# Patient Record
Sex: Female | Born: 1935 | Race: White | Hispanic: No | Smoking: Former smoker
Health system: Southern US, Community
[De-identification: ages and names within clinical notes are randomized; demographics above are authoritative.]

## PROBLEM LIST (undated history)

## (undated) DIAGNOSIS — I4439 Other atrioventricular block: Secondary | ICD-10-CM

## (undated) DIAGNOSIS — I739 Peripheral vascular disease, unspecified: Secondary | ICD-10-CM

## (undated) DIAGNOSIS — M1990 Unspecified osteoarthritis, unspecified site: Secondary | ICD-10-CM

## (undated) DIAGNOSIS — D649 Anemia, unspecified: Secondary | ICD-10-CM

## (undated) DIAGNOSIS — C801 Malignant (primary) neoplasm, unspecified: Secondary | ICD-10-CM

## (undated) DIAGNOSIS — J189 Pneumonia, unspecified organism: Secondary | ICD-10-CM

## (undated) DIAGNOSIS — I10 Essential (primary) hypertension: Secondary | ICD-10-CM

## (undated) HISTORY — PX: CHOLECYSTECTOMY: SHX55

## (undated) HISTORY — PX: COLON SURGERY: SHX602

## (undated) HISTORY — PX: TUBAL LIGATION: SHX77

## (undated) HISTORY — DX: Peripheral vascular disease, unspecified: I73.9

---

## 2004-12-24 ENCOUNTER — Ambulatory Visit: Payer: Self-pay | Admitting: Cardiology

## 2011-11-17 DIAGNOSIS — I10 Essential (primary) hypertension: Secondary | ICD-10-CM | POA: Diagnosis not present

## 2011-11-17 DIAGNOSIS — E782 Mixed hyperlipidemia: Secondary | ICD-10-CM | POA: Diagnosis not present

## 2011-11-17 DIAGNOSIS — R5381 Other malaise: Secondary | ICD-10-CM | POA: Diagnosis not present

## 2011-11-24 DIAGNOSIS — M810 Age-related osteoporosis without current pathological fracture: Secondary | ICD-10-CM | POA: Diagnosis not present

## 2011-11-24 DIAGNOSIS — I10 Essential (primary) hypertension: Secondary | ICD-10-CM | POA: Diagnosis not present

## 2011-11-24 DIAGNOSIS — M1990 Unspecified osteoarthritis, unspecified site: Secondary | ICD-10-CM | POA: Diagnosis not present

## 2011-11-24 DIAGNOSIS — E782 Mixed hyperlipidemia: Secondary | ICD-10-CM | POA: Diagnosis not present

## 2011-11-24 DIAGNOSIS — K5900 Constipation, unspecified: Secondary | ICD-10-CM | POA: Diagnosis not present

## 2011-11-24 DIAGNOSIS — C189 Malignant neoplasm of colon, unspecified: Secondary | ICD-10-CM | POA: Diagnosis not present

## 2012-03-17 ENCOUNTER — Inpatient Hospital Stay (HOSPITAL_COMMUNITY)
Admission: EM | Admit: 2012-03-17 | Discharge: 2012-03-23 | DRG: 377 | Disposition: A | Discharge status: Home / Self Care | Payer: Medicare Other | Source: Ambulatory Visit | Attending: Internal Medicine | Admitting: Internal Medicine

## 2012-03-17 ENCOUNTER — Encounter (HOSPITAL_COMMUNITY): Payer: Self-pay | Admitting: Emergency Medicine

## 2012-03-17 DIAGNOSIS — K922 Gastrointestinal hemorrhage, unspecified: Secondary | ICD-10-CM | POA: Diagnosis not present

## 2012-03-17 DIAGNOSIS — K633 Ulcer of intestine: Secondary | ICD-10-CM

## 2012-03-17 DIAGNOSIS — I959 Hypotension, unspecified: Secondary | ICD-10-CM | POA: Diagnosis present

## 2012-03-17 DIAGNOSIS — D126 Benign neoplasm of colon, unspecified: Secondary | ICD-10-CM | POA: Diagnosis present

## 2012-03-17 DIAGNOSIS — R110 Nausea: Secondary | ICD-10-CM | POA: Diagnosis not present

## 2012-03-17 DIAGNOSIS — Z79899 Other long term (current) drug therapy: Secondary | ICD-10-CM | POA: Diagnosis not present

## 2012-03-17 DIAGNOSIS — R079 Chest pain, unspecified: Secondary | ICD-10-CM | POA: Diagnosis not present

## 2012-03-17 DIAGNOSIS — D649 Anemia, unspecified: Secondary | ICD-10-CM | POA: Diagnosis not present

## 2012-03-17 DIAGNOSIS — I498 Other specified cardiac arrhythmias: Secondary | ICD-10-CM | POA: Diagnosis present

## 2012-03-17 DIAGNOSIS — I10 Essential (primary) hypertension: Secondary | ICD-10-CM | POA: Diagnosis present

## 2012-03-17 DIAGNOSIS — R55 Syncope and collapse: Secondary | ICD-10-CM

## 2012-03-17 DIAGNOSIS — R404 Transient alteration of awareness: Secondary | ICD-10-CM | POA: Diagnosis not present

## 2012-03-17 DIAGNOSIS — J189 Pneumonia, unspecified organism: Secondary | ICD-10-CM

## 2012-03-17 DIAGNOSIS — IMO0002 Reserved for concepts with insufficient information to code with codable children: Secondary | ICD-10-CM

## 2012-03-17 DIAGNOSIS — I2119 ST elevation (STEMI) myocardial infarction involving other coronary artery of inferior wall: Secondary | ICD-10-CM | POA: Diagnosis not present

## 2012-03-17 DIAGNOSIS — R918 Other nonspecific abnormal finding of lung field: Secondary | ICD-10-CM | POA: Diagnosis not present

## 2012-03-17 DIAGNOSIS — K253 Acute gastric ulcer without hemorrhage or perforation: Secondary | ICD-10-CM | POA: Diagnosis present

## 2012-03-17 DIAGNOSIS — Z85038 Personal history of other malignant neoplasm of large intestine: Secondary | ICD-10-CM | POA: Diagnosis present

## 2012-03-17 DIAGNOSIS — D62 Acute posthemorrhagic anemia: Secondary | ICD-10-CM | POA: Diagnosis present

## 2012-03-17 DIAGNOSIS — R0602 Shortness of breath: Secondary | ICD-10-CM | POA: Diagnosis not present

## 2012-03-17 DIAGNOSIS — K274 Chronic or unspecified peptic ulcer, site unspecified, with hemorrhage: Principal | ICD-10-CM | POA: Diagnosis present

## 2012-03-17 DIAGNOSIS — K635 Polyp of colon: Secondary | ICD-10-CM

## 2012-03-17 DIAGNOSIS — T3995XA Adverse effect of unspecified nonopioid analgesic, antipyretic and antirheumatic, initial encounter: Secondary | ICD-10-CM | POA: Diagnosis present

## 2012-03-17 DIAGNOSIS — Z7982 Long term (current) use of aspirin: Secondary | ICD-10-CM

## 2012-03-17 HISTORY — DX: Anemia, unspecified: D64.9

## 2012-03-17 HISTORY — DX: Unspecified osteoarthritis, unspecified site: M19.90

## 2012-03-17 HISTORY — DX: Essential (primary) hypertension: I10

## 2012-03-17 HISTORY — DX: Malignant (primary) neoplasm, unspecified: C80.1

## 2012-03-17 HISTORY — DX: Pneumonia, unspecified organism: J18.9

## 2012-03-17 MED ORDER — SODIUM CHLORIDE 0.9 % IV BOLUS (SEPSIS)
500.0000 mL | Freq: Once | INTRAVENOUS | Status: AC
  Administered 2012-03-17: 500 mL via INTRAVENOUS

## 2012-03-17 NOTE — ED Notes (Signed)
MD at bedside for eval.

## 2012-03-18 ENCOUNTER — Emergency Department (HOSPITAL_COMMUNITY): Payer: Medicare Other

## 2012-03-18 ENCOUNTER — Inpatient Hospital Stay (HOSPITAL_COMMUNITY): Payer: Medicare Other

## 2012-03-18 ENCOUNTER — Encounter (HOSPITAL_COMMUNITY): Payer: Self-pay

## 2012-03-18 DIAGNOSIS — D649 Anemia, unspecified: Secondary | ICD-10-CM | POA: Diagnosis not present

## 2012-03-18 DIAGNOSIS — K274 Chronic or unspecified peptic ulcer, site unspecified, with hemorrhage: Secondary | ICD-10-CM | POA: Diagnosis present

## 2012-03-18 DIAGNOSIS — K279 Peptic ulcer, site unspecified, unspecified as acute or chronic, without hemorrhage or perforation: Secondary | ICD-10-CM | POA: Diagnosis not present

## 2012-03-18 DIAGNOSIS — Z7982 Long term (current) use of aspirin: Secondary | ICD-10-CM | POA: Diagnosis not present

## 2012-03-18 DIAGNOSIS — I10 Essential (primary) hypertension: Secondary | ICD-10-CM | POA: Diagnosis present

## 2012-03-18 DIAGNOSIS — K5289 Other specified noninfective gastroenteritis and colitis: Secondary | ICD-10-CM | POA: Diagnosis not present

## 2012-03-18 DIAGNOSIS — D126 Benign neoplasm of colon, unspecified: Secondary | ICD-10-CM | POA: Diagnosis not present

## 2012-03-18 DIAGNOSIS — K922 Gastrointestinal hemorrhage, unspecified: Secondary | ICD-10-CM | POA: Diagnosis not present

## 2012-03-18 DIAGNOSIS — K2901 Acute gastritis with bleeding: Secondary | ICD-10-CM | POA: Diagnosis not present

## 2012-03-18 DIAGNOSIS — R112 Nausea with vomiting, unspecified: Secondary | ICD-10-CM

## 2012-03-18 DIAGNOSIS — R079 Chest pain, unspecified: Secondary | ICD-10-CM | POA: Diagnosis not present

## 2012-03-18 DIAGNOSIS — J438 Other emphysema: Secondary | ICD-10-CM | POA: Diagnosis not present

## 2012-03-18 DIAGNOSIS — R0602 Shortness of breath: Secondary | ICD-10-CM | POA: Diagnosis not present

## 2012-03-18 DIAGNOSIS — R110 Nausea: Secondary | ICD-10-CM | POA: Diagnosis not present

## 2012-03-18 DIAGNOSIS — J189 Pneumonia, unspecified organism: Secondary | ICD-10-CM | POA: Diagnosis present

## 2012-03-18 DIAGNOSIS — R55 Syncope and collapse: Secondary | ICD-10-CM | POA: Diagnosis present

## 2012-03-18 DIAGNOSIS — K633 Ulcer of intestine: Secondary | ICD-10-CM | POA: Diagnosis not present

## 2012-03-18 DIAGNOSIS — K253 Acute gastric ulcer without hemorrhage or perforation: Secondary | ICD-10-CM | POA: Diagnosis not present

## 2012-03-18 DIAGNOSIS — I2119 ST elevation (STEMI) myocardial infarction involving other coronary artery of inferior wall: Secondary | ICD-10-CM | POA: Diagnosis not present

## 2012-03-18 DIAGNOSIS — I498 Other specified cardiac arrhythmias: Secondary | ICD-10-CM | POA: Diagnosis present

## 2012-03-18 DIAGNOSIS — IMO0002 Reserved for concepts with insufficient information to code with codable children: Secondary | ICD-10-CM | POA: Diagnosis not present

## 2012-03-18 DIAGNOSIS — D696 Thrombocytopenia, unspecified: Secondary | ICD-10-CM

## 2012-03-18 DIAGNOSIS — I959 Hypotension, unspecified: Secondary | ICD-10-CM | POA: Diagnosis not present

## 2012-03-18 DIAGNOSIS — Z85038 Personal history of other malignant neoplasm of large intestine: Secondary | ICD-10-CM | POA: Diagnosis not present

## 2012-03-18 DIAGNOSIS — K6389 Other specified diseases of intestine: Secondary | ICD-10-CM | POA: Diagnosis not present

## 2012-03-18 DIAGNOSIS — J9819 Other pulmonary collapse: Secondary | ICD-10-CM | POA: Diagnosis not present

## 2012-03-18 DIAGNOSIS — R918 Other nonspecific abnormal finding of lung field: Secondary | ICD-10-CM | POA: Diagnosis not present

## 2012-03-18 DIAGNOSIS — E871 Hypo-osmolality and hyponatremia: Secondary | ICD-10-CM | POA: Diagnosis not present

## 2012-03-18 DIAGNOSIS — D62 Acute posthemorrhagic anemia: Secondary | ICD-10-CM | POA: Diagnosis not present

## 2012-03-18 LAB — COMPREHENSIVE METABOLIC PANEL
AST: 17 U/L (ref 0–37)
Albumin: 2.2 g/dL — ABNORMAL LOW (ref 3.5–5.2)
Alkaline Phosphatase: 40 U/L (ref 39–117)
BUN: 40 mg/dL — ABNORMAL HIGH (ref 6–23)
BUN: 48 mg/dL — ABNORMAL HIGH (ref 6–23)
CO2: 25 mEq/L (ref 19–32)
CO2: 25 mEq/L (ref 19–32)
Calcium: 8.1 mg/dL — ABNORMAL LOW (ref 8.4–10.5)
Chloride: 109 mEq/L (ref 96–112)
Creatinine, Ser: 0.73 mg/dL (ref 0.50–1.10)
GFR calc Af Amer: 87 mL/min — ABNORMAL LOW (ref >90)
GFR calc non Af Amer: 75 mL/min — ABNORMAL LOW (ref >90)
Glucose, Bld: 96 mg/dL (ref 70–99)
Potassium: 3.4 mEq/L — ABNORMAL LOW (ref 3.5–5.1)
Sodium: 144 mEq/L (ref 135–145)
Total Bilirubin: 0.1 mg/dL — ABNORMAL LOW (ref 0.3–1.2)
Total Protein: 5 g/dL — ABNORMAL LOW (ref 6.0–8.3)

## 2012-03-18 LAB — CBC
HCT: 23.6 % — ABNORMAL LOW (ref 36.0–46.0)
HCT: 29.7 % — ABNORMAL LOW (ref 36.0–46.0)
Hemoglobin: 10.2 g/dL — ABNORMAL LOW (ref 12.0–15.0)
MCH: 30.4 pg (ref 26.0–34.0)
MCHC: 34.3 g/dL (ref 30.0–36.0)
MCV: 88.4 fL (ref 78.0–100.0)
Platelets: 364 10*3/uL (ref 150–400)
RBC: 2.59 MIL/uL — ABNORMAL LOW (ref 3.87–5.11)
RBC: 3.36 MIL/uL — ABNORMAL LOW (ref 3.87–5.11)
RDW: 15 % (ref 11.5–15.5)
WBC: 17.2 10*3/uL — ABNORMAL HIGH (ref 4.0–10.5)

## 2012-03-18 LAB — GLUCOSE, CAPILLARY
Glucose-Capillary: 96 mg/dL (ref 70–99)
Glucose-Capillary: 79 mg/dL (ref 70–99)

## 2012-03-18 LAB — CARDIAC PANEL(CRET KIN+CKTOT+MB+TROPI)
CK, MB: 2 ng/mL (ref 0.3–4.0)
CK, MB: 1.8 ng/mL (ref 0.3–4.0)
Relative Index: INVALID (ref 0.0–2.5)
Total CK: 31 U/L (ref 7–177)
Troponin I: <0.3 ng/mL (ref <0.30)
Troponin I: <0.3 ng/mL (ref <0.30)

## 2012-03-18 LAB — LACTIC ACID, PLASMA: Lactic Acid, Venous: 1.3 mmol/L (ref 0.5–2.2)

## 2012-03-18 LAB — URINALYSIS, ROUTINE W REFLEX MICROSCOPIC
Bilirubin Urine: NEGATIVE
Hgb urine dipstick: NEGATIVE
Ketones, ur: NEGATIVE mg/dL
Protein, ur: NEGATIVE mg/dL
Urobilinogen, UA: 0.2 mg/dL (ref 0.0–1.0)

## 2012-03-18 MED ORDER — SODIUM CHLORIDE 0.9 % IV SOLN
INTRAVENOUS | Status: DC
  Administered 2012-03-19: 16:00:00 via INTRAVENOUS
  Administered 2012-03-20: 75 mL/h via INTRAVENOUS
  Administered 2012-03-20 (×2): 500 mL via INTRAVENOUS
  Administered 2012-03-20 (×2): via INTRAVENOUS

## 2012-03-18 MED ORDER — PANTOPRAZOLE SODIUM 40 MG IV SOLR
40.0000 mg | Freq: Two times a day (BID) | INTRAVENOUS | Status: DC
  Administered 2012-03-18 – 2012-03-19 (×3): 40 mg via INTRAVENOUS
  Filled 2012-03-18 (×6): qty 40

## 2012-03-18 MED ORDER — DEXTROSE 5 % IV SOLN
1.0000 g | Freq: Every day | INTRAVENOUS | Status: DC
  Administered 2012-03-18: 1 g via INTRAVENOUS
  Filled 2012-03-18 (×2): qty 10

## 2012-03-18 MED ORDER — BISACODYL 5 MG PO TBEC
5.0000 mg | DELAYED_RELEASE_TABLET | Freq: Four times a day (QID) | ORAL | Status: AC
  Administered 2012-03-18: 5 mg via ORAL
  Filled 2012-03-18: qty 1

## 2012-03-18 MED ORDER — SODIUM CHLORIDE 0.9 % IJ SOLN
INTRAMUSCULAR | Status: AC
  Administered 2012-03-18: 10:00:00
  Filled 2012-03-18: qty 10

## 2012-03-18 MED ORDER — DEXTROSE 5 % IV SOLN
500.0000 mg | Freq: Once | INTRAVENOUS | Status: AC
  Administered 2012-03-18: 500 mg via INTRAVENOUS
  Filled 2012-03-18: qty 500

## 2012-03-18 MED ORDER — ACETAMINOPHEN 325 MG PO TABS
650.0000 mg | ORAL_TABLET | Freq: Four times a day (QID) | ORAL | Status: DC | PRN
  Filled 2012-03-18: qty 1

## 2012-03-18 MED ORDER — ONDANSETRON HCL 4 MG/2ML IJ SOLN: 4.0000 mg | Freq: Four times a day (QID) | INTRAMUSCULAR | Status: DC | PRN

## 2012-03-18 MED ORDER — DEXTROSE 5 % IV SOLN
1.0000 g | Freq: Once | INTRAVENOUS | Status: AC
  Administered 2012-03-18: 1 g via INTRAVENOUS
  Filled 2012-03-18: qty 10

## 2012-03-18 MED ORDER — ACETAMINOPHEN 650 MG RE SUPP: 650.0000 mg | Freq: Four times a day (QID) | RECTAL | Status: DC | PRN

## 2012-03-18 MED ORDER — SODIUM CHLORIDE 0.9 % IJ SOLN
3.0000 mL | Freq: Two times a day (BID) | INTRAMUSCULAR | Status: DC
  Administered 2012-03-18 – 2012-03-22 (×8): 3 mL via INTRAVENOUS

## 2012-03-18 MED ORDER — DEXTROSE 5 % IV SOLN
500.0000 mg | Freq: Every day | INTRAVENOUS | Status: DC
  Administered 2012-03-18 – 2012-03-21 (×4): 500 mg via INTRAVENOUS
  Filled 2012-03-18 (×7): qty 500

## 2012-03-18 MED ORDER — SODIUM CHLORIDE 0.9 % IV BOLUS (SEPSIS)
500.0000 mL | Freq: Once | INTRAVENOUS | Status: AC
  Administered 2012-03-18: 500 mL via INTRAVENOUS

## 2012-03-18 MED ORDER — ONDANSETRON HCL 4 MG PO TABS: 4.0000 mg | ORAL_TABLET | Freq: Four times a day (QID) | ORAL | Status: DC | PRN

## 2012-03-18 MED ORDER — PEG-KCL-NACL-NASULF-NA ASC-C 100 G PO SOLR
1.0000 | Freq: Once | ORAL | Status: AC
  Administered 2012-03-18: 100 g via ORAL
  Filled 2012-03-18: qty 1

## 2012-03-18 NOTE — Progress Notes (Signed)
Utilization review completed.  

## 2012-03-18 NOTE — Consult Note (Signed)
[facility] Consult: 3:29 PM 03/18/2012   Referring Provider: Dr Butler Denmark Primary Care Physician:  Donzetta Sprung, MD Primary Gastroenterologist:  Dr. Marcell Anger, surgeion , has done colonoscopies  Reason for Consultation:  Anemia, normocytic.  FOB+  HPI: Mary [name] is a 78 y.o. female.  S/P partail colectomy for colon cancer around 1998.  Vague recall of follow up colonoscopies or recurrent polyps.  Last colonoscopy between 5 and 10 years ago.  Admitted 03/17/12.  At home pt had acute weakness yesterday.  Thinking is that she had presyncope.  Pt is vague in describing sxs.  No SOB, cough, chest pain.  Hgb 7.7, MCV 91.  BUN 40.  Creatinine normal.  FOB+ but there has been no blood or melena.  No N/V, anorexia, heartburn, belly pain, weight loss/gain.  Occasional Miralax for constipation.  Hgb 10.2 following transfusion.     PTA meds list includes Plavix, 81 and 325 mg ASA.  Pt says she only uses 81 mg ASA.  She also says she uses 400 mg Ibuprofen about 3 x weekly.  She is not recognize Plavix as something she takes. I called both Dr Reuel Boom and [facility] and nither confirms Plavix as an active prescription.      Past Medical History  Diagnosis Date  . Hypertension   . Osteoporosis   . Pneumonia   . Anemia   . Arthritis   . Cancer     colon cancer    Past Surgical History  Procedure Date  . Cholecystectomy   . Colon surgery     colon cancer  . Tubal ligation     Prior to Admission medications   Medication Sig Start Date End Date Taking? Authorizing Provider  aspirin 325 MG tablet Take 325 mg by mouth daily. She does not take this  Yes Historical Provider, MD  aspirin EC 81 MG tablet Take 81 mg by mouth daily.   Yes Historical Provider, MD  Calcium Carbonate-Vitamin D (CALCIUM + D PO) Take 1 tablet by mouth 2 (two) times daily.   Yes Historical Provider, MD  clopidogrel (PLAVIX) 300 MG TABS Take 300 mg by mouth once. [facility]  pharmacy and primary MD do not have her listed as taking this med  Yes Historical Provider, MD  diltiazem (DILACOR XR) 240 MG 24 hr capsule Take 240 mg by mouth daily.   Yes Historical Provider, MD  docusate sodium (COLACE) 100 MG capsule Take 100 mg by mouth 2 (two) times daily. Stool softener   Yes Historical Provider, MD  Heparin Lock Flush (HEPARIN FLUSH) 10 UNIT/ML injection 4,000 Units once.   Yes Historical Provider, MD  losartan-hydrochlorothiazide (HYZAAR) 100-25 MG per tablet Take 1 tablet by mouth daily.   Yes Historical Provider, MD  Red Yeast Rice 600 MG TABS Take 1 tablet by mouth daily.   Yes Historical Provider, MD  Miralax                                       PRN  Scheduled Meds:    . azithromycin  500 mg Intravenous Once  . azithromycin  500 mg Intravenous QHS  . cefTRIAXone (ROCEPHIN)  IV  1 g Intravenous Once  . cefTRIAXone (ROCEPHIN)  IV  1 g Intravenous QHS  . pantoprazole (PROTONIX) IV  40 mg Intravenous Q12H  . sodium chloride  500 mL Intravenous Once  . sodium chloride  500 mL Intravenous  Once  . sodium chloride  3 mL Intravenous Q12H  . sodium chloride       Infusions:    . sodium chloride 75 mL/hr at 03/18/12 1521   PRN Meds: acetaminophen, acetaminophen, ondansetron (ZOFRAN) IV, ondansetron   Allergies as of 03/17/2012  . (No Known Allergies)    History reviewed. No pertinent family history.  History   Social History  . Marital Status: Widowed    Spouse Name: N/A    Number of Children: N/A  . Years of Education: N/A   Occupational History  . Not on file.   Social History Main Topics  . Smoking status: Former Smoker    Quit date: 03/19/1979  . Smokeless tobacco: Never Used  . Alcohol Use: No  . Drug Use: No  . Sexually Active: Not Currently   Other Topics Concern  . Not on file   Social History Narrative  . No narrative on file    REVIEW OF SYSTEMS: Constitutional:  Generally strong.  Does her own vacuuming and lawn care.     ENT:  No nose bleeds Pulm:  No cough or SOB, no DOE CV:  No chest pain.  Some tachycardia with harder exertion. No pedal edema.  Thinks she may have had a test where they applied gel to her neck.  GU:  No hematuria, no dysuria.  No incontinence GI:  Per HPI Heme:  No hx anemia.    Transfusions:  None previously Neuro:  No headaches, seizures, numbness, falls Derm:  No rash, sores, itching Endocrine:  No polyuria, polydypsia, sweats Immunization:  Flu vaccine up to date Travel:  none   PHYSICAL EXAM: Vital signs in last 24 hours: Temp:  [98.1 F (36.7 C)-98.9 F (37.2 C)] 98.4 F (36.9 C) (05/09 1245) Pulse Rate:  [56-88] 66  (05/09 1245) Resp:  [16-28] 22  (05/09 1245) BP: (77-131)/(35-57) 110/57 mmHg (05/09 1245) SpO2:  [99 %-100 %] 100 % (05/09 1220) Weight:  [108 lb 7.5 oz (49.2 kg)] 108 lb 7.5 oz (49.2 kg) (05/09 0600)  General: thin but not necessarily cachectic looking elderly WF.  Does not look ill. Head:  No evidence trauma.  Face symmetric  Eyes:  No icterus or pallor Ears:  Slight HOH.  Nose:  No discharge or sneezing Mouth:  Teeth with some caries.  Many upper molars missing.  MM clear and moist Neck:  No JVD.  Bruit audible on the right carotid. Lungs:  Clear B.  No cough or dyspnea. Heart: RRR.  1/6 SEM.  Abdomen:  Soft, NT, ND, no bruits, mass , HSM.  Well healed lower transverse scar..   Rectal: formed hard stool in rectal vault is FOB-.  No masses.  Small , non-thrombosed external hemorrhoids.    Musc/Skeltl: arthritic changes in fingers Extremities:  No pedal edema.  Feet warm  Neurologic:  No tremor.  HOH.  Fully oriented.  Full strength of upper/lower limbs. Skin:  No rash or sores Tattoos:  none Nodes:  No groin or cervical adenopathy   Psych:  Pleasant, cooperative.  Not agitated or anxious.   Intake/Output from previous day: 05/08 0701 - 05/09 0700 In: 450 [I.V.:100; Blood:350] Out: 850 [Urine:850] Intake/Output this shift: Total I/O In: 603  [I.V.:253; Blood:350] Out: 1150 [Urine:1150]  LAB RESULTS:  Basename 03/18/12 1420 03/17/12 2342  WBC 13.2* 17.2*  HGB 10.2* 7.7*  HCT 29.7* 23.6*  PLT 291 364   BMET Lab Results  Component Value Date   NA 144 03/18/2012  NA 141 03/17/2012   K 3.4* 03/18/2012   K 3.4* 03/17/2012   CL 110 03/18/2012   CL 109 03/17/2012   CO2 25 03/18/2012   CO2 25 03/17/2012   GLUCOSE 96 03/18/2012   GLUCOSE 147* 03/17/2012   BUN 48* 03/18/2012   BUN 40* 03/17/2012   CREATININE 0.73 03/18/2012   CREATININE 0.89 03/17/2012   CALCIUM 8.1* 03/18/2012   CALCIUM 8.0* 03/17/2012   LFT  Basename 03/18/12 1420 03/17/12 2342  PROT 5.0* 4.7*  ALBUMIN 2.3* 2.2*  AST 15 17  ALT 10 10  ALKPHOS 38* 40  BILITOT 0.3 0.1*  BILIDIR -- --  IBILI -- --   PT/INR Lab Results  Component Value Date   INR 1.08 03/18/2012    RADIOLOGY STUDIES: Dg Chest 2 View 03/18/2012  *RADIOLOGY REPORT*  Clinical Data: Syncope; nausea.  CHEST - 2 VIEW  Comparison: None.  Findings: The lungs are well-aerated.  Left midlung airspace opacity raises concern for pneumonia.  There is no evidence of pleural effusion or pneumothorax.  The heart is normal in size; the mediastinal contour is within normal limits. Moderate hiatal hernia is noted, with an air-fluid level.  No acute osseous abnormalities are seen.  There is slight loss of vertebral body height along the lower thoracic spine.  IMPRESSION:  1.  Left midlung airspace opacity raises concern for pneumonia.  If the patient has symptoms of pneumonia, would treat for pneumonia and then perform follow-up chest radiograph to ensure resolution of airspace opacity. 2.  Moderate hiatal hernia noted, with an air-fluid level.  Original Report Authenticated By: Tonia Ghent, M.D.   Ct Head Wo Contrast 03/18/2012  *RADIOLOGY REPORT*  Clinical Data: Nausea and flushing; syncope.  Hypotension.  CT HEAD WITHOUT CONTRAST  Technique:  Contiguous axial images were obtained from the base of the skull through the vertex  without contrast.  Comparison: None.  Findings: There is no evidence of acute infarction, mass lesion, or intra- or extra-axial hemorrhage on CT.  Prominence of the ventricles and sulci reflects mild cortical volume loss.  Mild scattered periventricular and white matter change likely reflects small vessel ischemic microangiopathy.  The posterior fossa, including the cerebellum, brainstem and fourth ventricle, is within normal limits.  The basal ganglia are unremarkable in appearance.  The cerebral hemispheres demonstrate grossly normal gray-white differentiation.  No mass effect or midline shift is seen.  There is no evidence of fracture; visualized osseous structures are unremarkable in appearance.  The orbits are within normal limits. The paranasal sinuses and mastoid air cells are well-aerated.  No significant soft tissue abnormalities are seen.  IMPRESSION:  1.  No acute intracranial pathology seen on CT. 2.  Mild cortical volume loss and scattered small vessel ischemic microangiopathy.  Original Report Authenticated By: Tonia Ghent, M.D.    ENDOSCOPIC STUDIES: Colonoscopy, year unknown  IMPRESSION: 1.  Normocytic anemia, symptomatic with weakness.  FOB+ at admission, FOB- by my exam today.  Suspect this is an acute anemia, given normal MCV, and very recent high functional capacity (pushed lawn mower over the weekend).  Her BUN is elevated which often correlates with upper GI bleeding source.  Good response to transfusion 2 units PRBCs 2,  Hx colon cancer about 15 years ago.  Partial colectomy, no adjuvant therapy.  Year of last colonoscopy unknown, but was more than 5 years ago.   3.  Hiatal hernia on cxr..  No reflux sxs.  4.  Pneumonia.  WBCs improved.  On Zithromax and  Rocephin.  5.  Right carotid bruit.  6.  Plavix listed as PTA med.  This is not listed as active med by Dr Reuel Boom in Jan 2013, nor by [facility] pharmacy.  So do not think she takes this.  Indication not apparent. (no cva, TIA, A  fib, stent.. In her hx) if she ever did take this, though she does have carotid bruit.   PLAN: 1.  EGD/colonoscopy tomorrow.  2.  D/c foley, will complicate prep and is risk for infection.    LOS: 1 day   Jennye Moccasin  03/18/2012, 3:29 PM Pager: [phone]

## 2012-03-18 NOTE — Care Management Note (Signed)
    Page 1 of 1   03/22/2012     2:35:04 PM   CARE MANAGEMENT NOTE 03/22/2012  Patient:  Mary [name], Mary [name]   Account Number:  [id number]  Date Initiated:  03/18/2012  Documentation initiated by:  Donn Pierini  Subjective/Objective Assessment:   Pt admitted with GIB, hgb 7.7     Action/Plan:   PTA lived at home with daughter, was independent with ADLs   Anticipated DC Date:  03/22/2012   Anticipated DC Plan:  HOME W HOME HEALTH SERVICES      DC Planning Services  CM consult      Choice offered to / List presented to:             Status of service:  In process, will continue to follow Medicare Important Message given?   (If response is "NO", the following Medicare IM given date fields will be blank) Date Medicare IM given:   Date Additional Medicare IM given:    Discharge Disposition:    Per UR Regulation:  Reviewed for med. necessity/level of care/duration of stay  If discussed at Long Length of Stay Meetings, dates discussed:    Comments:  PCP- [place]  03/22/12 14:30p debbie Delyle Weider rn,bsn [phone] still on iv protonix and iv zithromax.  03/18/12- 1130- Donn Pierini RN, BSN [phone] Spoke with pt at bedside- per conversation pt states that she lives at home with daughter, and is independent- does not use any DME. Pt reports that she has an appointment with her PCP next month. She has medication coverage and transportaion home. NCM to follow for d/c needs/planning.

## 2012-03-18 NOTE — ED Provider Notes (Signed)
History     CSN: [id number]  Arrival date & time 03/17/12  2323   First MD Initiated Contact with Patient 03/17/12 2338      Chief Complaint  Patient presents with  . Loss of Consciousness    (Consider location/radiation/quality/duration/timing/severity/associated sxs/prior treatment) HPI A LEVEL 5 CAVEAT PERTAINS DUE TO URGENT NEED FOR INTERVENTION Patient presents as a transfer from more had emergency department do to concern for code 64. Patient was evaluated by cardiology and cleared from a [place] standpoint. However she has had near syncopal episode which prompted coming to the emergency department. She describes feeling flushed and faint and then nearly loses consciousness but does not. She denies any chest pain or shortness of breath. She has no nausea. She has not syncopized. She denies any fever or chills. Past Medical History  Diagnosis Date  . Hypertension   . Osteoporosis   . Pneumonia   . Anemia   . Arthritis   . Cancer     colon cancer    Past Surgical History  Procedure Date  . Cholecystectomy   . Colon surgery     colon cancer  . Tubal ligation     History reviewed. No pertinent family history.  History  Substance Use Topics  . Smoking status: Former Smoker    Quit date: 03/19/1979  . Smokeless tobacco: Never Used  . Alcohol Use: No    OB History    Grav Para Term Preterm Abortions TAB SAB Ect Mult Living                  Review of Systems UNABLE TO OBTAIN ROS DUE TO LEVEL 5 CAVEAT  Allergies  Review of patient's allergies indicates no known allergies.  Home Medications   No current outpatient prescriptions on file.  BP 124/74  Pulse 95  Temp(Src) 97.5 F (36.4 C) (Oral)  Resp 20  Ht 5\' 1"  (1.549 m)  Wt 108 lb 7.5 oz (49.2 kg)  BMI 20.49 kg/m2  SpO2 98% Vitals reviewed Physical Exam Physical Examination: General appearance - alert, tired appearing, and in no distress Mental status - alert, oriented to person, place, and  time Eyes - pupils equal and reactive, extraocular eye movements intact, conjunctival pallor Mouth - mucous membranes moist, pharynx normal without lesions Chest - clear to auscultation, no wheezes, rales or rhonchi, symmetric air entry Heart - normal rate, regular rhythm, normal S1, S2, no murmurs, rubs, clicks or gallops Abdomen - soft, nontender, nondistended, no masses or organomegaly, nabs Neurological - alert, oriented, normal speech, strength 5/5 in extremities x 4, sensation intact Extremities - peripheral pulses normal, no pedal edema, no clubbing or cyanosis Skin - normal coloration and turgor, no rashes, generalized pallor  ED Course  Procedures (including critical care time)   Date: 03/18/2012  Rate: 67  Rhythm: normal sinus rhythm  QRS Axis: normal  Intervals: normal  ST/T Wave abnormalities: nonspecific ST/T changes  Conduction Disutrbances:none  Narrative Interpretation: baseline wander in inferior leads  Old EKG Reviewed: none available  CRITICAL CARE Performed by: Ethelda Chick   Total critical care time: 40  Critical care time was exclusive of separately billable procedures and treating other patients.  Critical care was necessary to treat or prevent imminent or life-threatening deterioration.  Critical care was time spent personally by me on the following activities: development of treatment plan with patient and/or surrogate as well as nursing, discussions with consultants, evaluation of patient's response to treatment, examination of patient, obtaining history from  patient or surrogate, ordering and performing treatments and interventions, ordering and review of laboratory studies, ordering and review of radiographic studies, pulse oximetry and re-evaluation of patient's condition. Care  2:18 AM  D/w Dr. Toniann Fail, [facility]- he requests head CT, patient to be admitted to team 3, telemetry  3:17 AM- hemocult positive- no gross blood or melena, soft brown  stool.  Labs Reviewed  CBC - Abnormal; Notable for the following:    WBC 17.2 (*)    RBC 2.59 (*)    Hemoglobin 7.7 (*)    HCT 23.6 (*)    All other components within normal limits  COMPREHENSIVE METABOLIC PANEL - Abnormal; Notable for the following:    Potassium 3.4 (*)    Glucose, Bld 147 (*)    BUN 40 (*)    Calcium 8.0 (*)    Total Protein 4.7 (*)    Albumin 2.2 (*)    Total Bilirubin 0.1 (*)    GFR calc non Af Amer 57 (*)    GFR calc Af Amer 66 (*)    All other components within normal limits  CBC - Abnormal; Notable for the following:    WBC 13.2 (*)    RBC 3.36 (*)    Hemoglobin 10.2 (*) POST TRANSFUSION SPECIMEN   HCT 29.7 (*)    All other components within normal limits  COMPREHENSIVE METABOLIC PANEL - Abnormal; Notable for the following:    Potassium 3.4 (*)    BUN 48 (*)    Calcium 8.1 (*)    Total Protein 5.0 (*)    Albumin 2.3 (*)    Alkaline Phosphatase 38 (*)    GFR calc non Af Amer 75 (*)    GFR calc Af Amer 87 (*)    All other components within normal limits  GLUCOSE, CAPILLARY - Abnormal; Notable for the following:    Glucose-Capillary 108 (*)    All other components within normal limits  CBC - Abnormal; Notable for the following:    WBC 12.4 (*)    RBC 3.22 (*)    Hemoglobin 9.7 (*)    HCT 28.4 (*)    All other components within normal limits  BASIC METABOLIC PANEL - Abnormal; Notable for the following:    Potassium 2.9 (*)    Chloride 114 (*)    BUN 24 (*) DELTA CHECK NOTED   Calcium 7.8 (*)    GFR calc non Af Amer 79 (*)    All other components within normal limits  RETICULOCYTES - Abnormal; Notable for the following:    RBC. 3.22 (*)    All other components within normal limits  BASIC METABOLIC PANEL - Abnormal; Notable for the following:    Chloride 114 (*)    CO2 18 (*)    Calcium 7.3 (*)    GFR calc non Af Amer 78 (*)    All other components within normal limits  CBC - Abnormal; Notable for the following:    WBC 17.5 (*)    RBC  2.96 (*)    Hemoglobin 8.9 (*)    HCT 26.2 (*)    All other components within normal limits  CBC - Abnormal; Notable for the following:    WBC 13.3 (*)    RBC 2.73 (*)    Hemoglobin 8.1 (*)    HCT 24.1 (*)    All other components within normal limits  CULTURE, BLOOD (ROUTINE X 2)  CULTURE, BLOOD (ROUTINE X 2)  URINALYSIS, ROUTINE W REFLEX MICROSCOPIC  URINE CULTURE  CARDIAC PANEL(CRET KIN+CKTOT+MB+TROPI)  TYPE AND SCREEN  PREPARE RBC (CROSSMATCH)  ABO/RH  LACTIC ACID, PLASMA  OCCULT BLOOD, POC DEVICE  PROTIME-INR  CARDIAC PANEL(CRET KIN+CKTOT+MB+TROPI)  URINE CULTURE  MRSA PCR SCREENING  GLUCOSE, CAPILLARY  GLUCOSE, CAPILLARY  VITAMIN B12  FOLATE  IRON AND TIBC  FERRITIN  GLUCOSE, CAPILLARY  OCCULT BLOOD X 1 CARD TO LAB, STOOL  CBC  BASIC METABOLIC PANEL   No results found.   1. Anemia 2. Pneumonia 3. Near syncope 4. leukocytosis    MDM  Pt presenting as transfer from [facility] due to concern for code stemi.  Seen by cardiology in ED and code stemi cancelled.  Pt with anemia on workup as well as pneuomonia on CXR with leukocytosis.  Pt started on antibiotics, PRBCs ordered.  Rectal reveals soft brown stool- but heme positive.  Pt denies hx of anemia.  Pt admitted to [facility] service for further evaluation and management.         Ethelda Chick, MD 03/20/12 [phone]

## 2012-03-18 NOTE — ED Notes (Signed)
Pt had an episode of feeling hot and pale. Pt's BP dropped to 77/36. Pt layed flat and a 500cc bolus started. MD made aware and at bedside to see pt.

## 2012-03-18 NOTE — Progress Notes (Signed)
Pt transferred from [facility] as suspected STEMI.  EMS and pt report nausea/flushing episode followed by syncope at home.  Upon EMS arrival to patients home BP 70s systolic that improved with fluids. She has not had chest pain and there are no diagnostic changes on her EKG at [facility] or here presently.  Of note she did have an EKG in EMS that showed LBBB that has since resolved.  I suspect this is a rate related bundle branch block. She reports that she has not had any chest pain.  By my exam she is pale and lethargic with hypotension.  She has a murmur of chronic mitral regurgitation. Her labs are notable for WBC 18 and Hct 26.  ED will workup for sepsis - STEMI called off, discussed with Dr. Herbie Baltimore - please call if I can be of any assistance

## 2012-03-18 NOTE — ED Notes (Signed)
Dr. Karma Ganja advised of positive Hemoccult result.

## 2012-03-18 NOTE — Progress Notes (Signed)
[facility]   78 y/o admitted with multiple episodes of syncope and found to have Pneumonia, anemia and was stool occult postitive.   She tells me that she has had a dry hacking cough for about 2 weeks and thought it was allergies. She has not noticed any blood in her stool or had any epigastric discomfort or reflux. No weight loss. Her colectomy for colon cancer was 16 yrs ago in [place]. She did not need adjuvant therapy as nodes were negative. Last colonoscopy was at least 10 yrs ago as well.   Pt examined. Mild crackles in b/l bases  A/p  Near syncope Due to dehydration and hypotension. CT head negative.   GI bleed/H/O colon cancer, stage I Will contact GI. Cont to follow stool occults.    Anemia S/p 2 units PRBC.repeat Hgb is 10.2. F/u on iron levels in AM.   Pneumonia- CAP Cont Zithro and Rocephin  Hypotensive and bradycardic in ER Holding all hypertensives   Calvert Cantor, MD [phone]

## 2012-03-18 NOTE — H&P (Signed)
Mary [name] is an 78 y.o. female.   PCP - Dr.Daniel Zacarias Pontes. Chief Complaint: Passing out. HPI: 78 year-old female with known history of hypertension, history of colon cancer status post partial colectomy was brought to the ER after transfer from [facility] for code STEMI. Cardiologist over here canceled the code. As per patient's daughter last evening while at home patient suddenly became listless. She called EMS and was taken to [facility]. Patient has had at least 4-5 episodes since then. Each episode lasts only for a few seconds. And during those episodes patient was found to be hypotensive. In the ER patient was initially found to be mildly hypotensive and normal saline 500 cc bolus was given. Lab work show anemia and stool for occult blood is positive. In addition chest x-ray shows infiltrates. As per patient's daughter patient has not had any specific complaints including chest pain, shortness of breath, nausea vomiting, abdominal pain, diarrhea prior to this incident.  Past Medical History  Diagnosis Date  . Hypertension   . Osteoporosis   . Pneumonia   . Anemia   . Arthritis   . Cancer     colon cancer    Past Surgical History  Procedure Date  . Cholecystectomy   . Colon surgery     colon cancer  . Tubal ligation     History reviewed. No pertinent family history. Social History:  reports that she quit smoking about 33 years ago. She has never used smokeless tobacco. She reports that she does not drink alcohol or use illicit drugs.  Allergies: No Known Allergies  Medications Prior to Admission  Medication Sig Dispense Refill  . aspirin 325 MG tablet Take 325 mg by mouth daily.      Marland Kitchen aspirin EC 81 MG tablet Take 81 mg by mouth daily.      . Calcium Carbonate-Vitamin D (CALCIUM + D PO) Take 1 tablet by mouth 2 (two) times daily.      . clopidogrel (PLAVIX) 300 MG TABS Take 300 mg by mouth once.      . diltiazem (DILACOR XR) 240 MG 24 hr capsule Take  240 mg by mouth daily.      Marland Kitchen docusate sodium (COLACE) 100 MG capsule Take 100 mg by mouth 2 (two) times daily. Stool softener      . Heparin Lock Flush (HEPARIN FLUSH) 10 UNIT/ML injection 4,000 Units once.      Marland Kitchen losartan-hydrochlorothiazide (HYZAAR) 100-25 MG per tablet Take 1 tablet by mouth daily.      . Red Yeast Rice 600 MG TABS Take 1 tablet by mouth daily.        Results for orders placed during the hospital encounter of 03/17/12 (from the past 48 hour(s))  CBC     Status: Abnormal   Collection Time   03/17/12 11:42 PM      Component Value Range Comment   WBC 17.2 (*) 4.0 - 10.5 (K/uL)    RBC 2.59 (*) 3.87 - 5.11 (MIL/uL)    Hemoglobin 7.7 (*) 12.0 - 15.0 (g/dL)    HCT 16.1 (*) 09.6 - 46.0 (%)    MCV 91.1  78.0 - 100.0 (fL)    MCH 29.7  26.0 - 34.0 (pg)    MCHC 32.6  30.0 - 36.0 (g/dL)    RDW 04.5  40.9 - 81.1 (%)    Platelets 364  150 - 400 (K/uL)   COMPREHENSIVE METABOLIC PANEL     Status: Abnormal   Collection Time  03/17/12 11:42 PM      Component Value Range Comment   Sodium 141  135 - 145 (mEq/L)    Potassium 3.4 (*) 3.5 - 5.1 (mEq/L)    Chloride 109  96 - 112 (mEq/L)    CO2 25  19 - 32 (mEq/L)    Glucose, Bld 147 (*) 70 - 99 (mg/dL)    BUN 40 (*) 6 - 23 (mg/dL)    Creatinine, Ser 4.09  0.50 - 1.10 (mg/dL)    Calcium 8.0 (*) 8.4 - 10.5 (mg/dL)    Total Protein 4.7 (*) 6.0 - 8.3 (g/dL)    Albumin 2.2 (*) 3.5 - 5.2 (g/dL)    AST 17  0 - 37 (U/L)    ALT 10  0 - 35 (U/L)    Alkaline Phosphatase 40  39 - 117 (U/L)    Total Bilirubin 0.1 (*) 0.3 - 1.2 (mg/dL)    GFR calc non Af Amer 57 (*) >90 (mL/min)    GFR calc Af Amer 66 (*) >90 (mL/min)   CARDIAC PANEL(CRET KIN+CKTOT+MB+TROPI)     Status: Normal   Collection Time   03/17/12 11:52 PM      Component Value Range Comment   Total CK 31  7 - 177 (U/L)    CK, MB 2.0  0.3 - 4.0 (ng/mL)    Troponin I <0.30  <0.30 (ng/mL)    Relative Index RELATIVE INDEX IS INVALID  0.0 - 2.5    URINALYSIS, ROUTINE W REFLEX MICROSCOPIC      Status: Normal   Collection Time   03/18/12 12:10 AM      Component Value Range Comment   Color, Urine YELLOW  YELLOW     APPearance CLEAR  CLEAR     Specific Gravity, Urine 1.016  1.005 - 1.030     pH 7.0  5.0 - 8.0     Glucose, UA NEGATIVE  NEGATIVE (mg/dL)    Hgb urine dipstick NEGATIVE  NEGATIVE     Bilirubin Urine NEGATIVE  NEGATIVE     Ketones, ur NEGATIVE  NEGATIVE (mg/dL)    Protein, ur NEGATIVE  NEGATIVE (mg/dL)    Urobilinogen, UA 0.2  0.0 - 1.0 (mg/dL)    Nitrite NEGATIVE  NEGATIVE     Leukocytes, UA NEGATIVE  NEGATIVE  MICROSCOPIC NOT DONE ON URINES WITH NEGATIVE PROTEIN, BLOOD, LEUKOCYTES, NITRITE, OR GLUCOSE <1000 mg/dL.  TYPE AND SCREEN     Status: Normal (Preliminary result)   Collection Time   03/18/12 12:59 AM      Component Value Range Comment   ABO/RH(D) A POS      Antibody Screen NEG      Sample Expiration 03/21/2012      Unit Number [id number]      Blood Component Type RED CELLS,LR      Unit division 00      Status of Unit ALLOCATED      Transfusion Status OK TO TRANSFUSE      Crossmatch Result Compatible      Unit Number [id number]      Blood Component Type RED CELLS,LR      Unit division 00      Status of Unit ALLOCATED      Transfusion Status OK TO TRANSFUSE      Crossmatch Result Compatible     ABO/RH     Status: Normal   Collection Time   03/18/12 12:59 AM      Component Value Range Comment   ABO/RH(D)  A POS     PREPARE RBC (CROSSMATCH)     Status: Normal   Collection Time   03/18/12  2:00 AM      Component Value Range Comment   Order Confirmation ORDER PROCESSED BY BLOOD BANK     LACTIC ACID, PLASMA     Status: Normal   Collection Time   03/18/12  3:01 AM      Component Value Range Comment   Lactic Acid, Venous 1.3  0.5 - 2.2 (mmol/L)   OCCULT BLOOD, POC DEVICE     Status: Normal   Collection Time   03/18/12  3:17 AM      Component Value Range Comment   Fecal Occult Bld POSITIVE      Dg Chest 2 View  03/18/2012  *RADIOLOGY REPORT*  Clinical  Data: Syncope; nausea.  CHEST - 2 VIEW  Comparison: None.  Findings: The lungs are well-aerated.  Left midlung airspace opacity raises concern for pneumonia.  There is no evidence of pleural effusion or pneumothorax.  The heart is normal in size; the mediastinal contour is within normal limits. Moderate hiatal hernia is noted, with an air-fluid level.  No acute osseous abnormalities are seen.  There is slight loss of vertebral body height along the lower thoracic spine.  IMPRESSION:  1.  Left midlung airspace opacity raises concern for pneumonia.  If the patient has symptoms of pneumonia, would treat for pneumonia and then perform follow-up chest radiograph to ensure resolution of airspace opacity. 2.  Moderate hiatal hernia noted, with an air-fluid level.  Original Report Authenticated By: Tonia Ghent, M.D.   Ct Head Wo Contrast  03/18/2012  *RADIOLOGY REPORT*  Clinical Data: Nausea and flushing; syncope.  Hypotension.  CT HEAD WITHOUT CONTRAST  Technique:  Contiguous axial images were obtained from the base of the skull through the vertex without contrast.  Comparison: None.  Findings: There is no evidence of acute infarction, mass lesion, or intra- or extra-axial hemorrhage on CT.  Prominence of the ventricles and sulci reflects mild cortical volume loss.  Mild scattered periventricular and white matter change likely reflects small vessel ischemic microangiopathy.  The posterior fossa, including the cerebellum, brainstem and fourth ventricle, is within normal limits.  The basal ganglia are unremarkable in appearance.  The cerebral hemispheres demonstrate grossly normal gray-white differentiation.  No mass effect or midline shift is seen.  There is no evidence of fracture; visualized osseous structures are unremarkable in appearance.  The orbits are within normal limits. The paranasal sinuses and mastoid air cells are well-aerated.  No significant soft tissue abnormalities are seen.  IMPRESSION:  1.  No acute  intracranial pathology seen on CT. 2.  Mild cortical volume loss and scattered small vessel ischemic microangiopathy.  Original Report Authenticated By: Tonia Ghent, M.D.    Review of Systems  Constitutional: Negative.   HENT: Negative.   Eyes: Negative.   Respiratory: Negative.   Cardiovascular: Negative.   Gastrointestinal: Negative.   Genitourinary: Negative.   Musculoskeletal: Negative.   Skin: Negative.   Neurological: Positive for dizziness.  Psychiatric/Behavioral: Negative.     Blood pressure 95/47, pulse 62, temperature 98.1 F (36.7 C), temperature source Oral, resp. rate 25, SpO2 100.00%. Physical Exam  Constitutional: She is oriented to person, place, and time. She appears well-developed. No distress.       Cachectic.  HENT:  Head: Normocephalic and atraumatic.  Right Ear: External ear normal.  Left Ear: External ear normal.  Mouth/Throat: No oropharyngeal exudate.  Eyes:  Pupils are equal, round, and reactive to light. Right eye exhibits no discharge. Left eye exhibits no discharge. No scleral icterus.       Pallor.  Neck: Normal range of motion. Neck supple.  Cardiovascular: Normal rate and regular rhythm.   Respiratory: Effort normal and breath sounds normal. No respiratory distress. She has no wheezes. She has no rales.  GI: Soft. Bowel sounds are normal. She exhibits no distension. There is no tenderness. There is no rebound.  Musculoskeletal: Normal range of motion. She exhibits no edema and no tenderness.  Neurological: She is alert and oriented to person, place, and time.       Moves all extremities.  Skin: Skin is warm and dry. She is not diaphoretic.  Psychiatric: Her behavior is normal.     Assessment/Plan #1. GI bleed with history of colon cancer - transfuse 2 units of packed red blood cells. At this time I will keep patient on Protonix IV every 12. Patient takes aspirin which will be discontinued. Keep patient n.p.o. and consult GI. Recheck CBC after  transfusion. #2. Pneumonia - we'll treat this as community-acquired pneumonia. Patient has been started on ceftriaxone and azithromycin which will continue.  #3. Hypo-tension and near-syncopal episodes from volume depletion and possible infection - see #1 and 2. #4. History of colon cancer status post resection. #5. History of hypertension presently hypotensive. #6. Anemia from #1 reason.  CODE STATUS - full code.  Danniel Grenz N. 03/18/2012, 5:18 AM

## 2012-03-19 ENCOUNTER — Encounter (HOSPITAL_COMMUNITY)
Admission: EM | Disposition: A | Discharge status: Home / Self Care | Payer: Self-pay | Source: Ambulatory Visit | Attending: Internal Medicine

## 2012-03-19 ENCOUNTER — Encounter (HOSPITAL_COMMUNITY): Payer: Self-pay

## 2012-03-19 DIAGNOSIS — IMO0002 Reserved for concepts with insufficient information to code with codable children: Secondary | ICD-10-CM

## 2012-03-19 DIAGNOSIS — E871 Hypo-osmolality and hyponatremia: Secondary | ICD-10-CM

## 2012-03-19 DIAGNOSIS — D126 Benign neoplasm of colon, unspecified: Secondary | ICD-10-CM

## 2012-03-19 DIAGNOSIS — K279 Peptic ulcer, site unspecified, unspecified as acute or chronic, without hemorrhage or perforation: Secondary | ICD-10-CM

## 2012-03-19 DIAGNOSIS — D696 Thrombocytopenia, unspecified: Secondary | ICD-10-CM

## 2012-03-19 DIAGNOSIS — I10 Essential (primary) hypertension: Secondary | ICD-10-CM

## 2012-03-19 DIAGNOSIS — D649 Anemia, unspecified: Secondary | ICD-10-CM

## 2012-03-19 DIAGNOSIS — K253 Acute gastric ulcer without hemorrhage or perforation: Secondary | ICD-10-CM | POA: Diagnosis present

## 2012-03-19 DIAGNOSIS — K635 Polyp of colon: Secondary | ICD-10-CM | POA: Diagnosis present

## 2012-03-19 DIAGNOSIS — K633 Ulcer of intestine: Secondary | ICD-10-CM | POA: Diagnosis not present

## 2012-03-19 DIAGNOSIS — Z85038 Personal history of other malignant neoplasm of large intestine: Secondary | ICD-10-CM

## 2012-03-19 HISTORY — PX: ESOPHAGOGASTRODUODENOSCOPY: SHX5428

## 2012-03-19 HISTORY — PX: COLONOSCOPY: SHX5424

## 2012-03-19 LAB — CBC
HCT: 28.4 % — ABNORMAL LOW (ref 36.0–46.0)
HCT: 26.2 % — ABNORMAL LOW (ref 36.0–46.0)
Hemoglobin: 9.7 g/dL — ABNORMAL LOW (ref 12.0–15.0)
Hemoglobin: 8.9 g/dL — ABNORMAL LOW (ref 12.0–15.0)
MCH: 30.1 pg (ref 26.0–34.0)
MCHC: 34.2 g/dL (ref 30.0–36.0)
RBC: 2.96 MIL/uL — ABNORMAL LOW (ref 3.87–5.11)
RDW: 15 % (ref 11.5–15.5)

## 2012-03-19 LAB — RETICULOCYTES
RBC.: 3.22 MIL/uL — ABNORMAL LOW (ref 3.87–5.11)
Retic Count, Absolute: 45.1 10*3/uL (ref 19.0–186.0)
Retic Ct Pct: 1.4 % (ref 0.4–3.1)

## 2012-03-19 LAB — BASIC METABOLIC PANEL
BUN: 24 mg/dL — ABNORMAL HIGH (ref 6–23)
BUN: 12 mg/dL (ref 6–23)
Calcium: 7.8 mg/dL — ABNORMAL LOW (ref 8.4–10.5)
Chloride: 114 mEq/L — ABNORMAL HIGH (ref 96–112)
GFR calc Af Amer: >90 mL/min (ref >90)
GFR calc Af Amer: >90 mL/min (ref >90)
GFR calc non Af Amer: 79 mL/min — ABNORMAL LOW (ref >90)
GFR calc non Af Amer: 78 mL/min — ABNORMAL LOW (ref >90)
Glucose, Bld: 88 mg/dL (ref 70–99)
Potassium: 2.9 mEq/L — ABNORMAL LOW (ref 3.5–5.1)
Potassium: 4.2 mEq/L (ref 3.5–5.1)
Sodium: 140 mEq/L (ref 135–145)

## 2012-03-19 LAB — URINE CULTURE: Culture: NO GROWTH

## 2012-03-19 LAB — IRON AND TIBC: UIBC: 149 ug/dL (ref 125–400)

## 2012-03-19 LAB — FERRITIN: Ferritin: 68 ng/mL (ref 10–291)

## 2012-03-19 SURGERY — COLONOSCOPY
Anesthesia: Moderate Sedation

## 2012-03-19 MED ORDER — FENTANYL CITRATE 0.05 MG/ML IJ SOLN
INTRAMUSCULAR | Status: AC
  Filled 2012-03-19: qty 2

## 2012-03-19 MED ORDER — SODIUM CHLORIDE 0.9 % IJ SOLN
INTRAMUSCULAR | Status: AC
  Administered 2012-03-19: 14:00:00
  Filled 2012-03-19: qty 10

## 2012-03-19 MED ORDER — PANTOPRAZOLE SODIUM 40 MG PO TBEC
40.0000 mg | DELAYED_RELEASE_TABLET | Freq: Two times a day (BID) | ORAL | Status: DC
  Administered 2012-03-19: 40 mg via ORAL
  Filled 2012-03-19: qty 1

## 2012-03-19 MED ORDER — POTASSIUM CHLORIDE CRYS ER 20 MEQ PO TBCR
40.0000 meq | EXTENDED_RELEASE_TABLET | Freq: Once | ORAL | Status: AC
  Administered 2012-03-19: 40 meq via ORAL
  Filled 2012-03-19: qty 2

## 2012-03-19 MED ORDER — SODIUM CHLORIDE 0.9 % IV BOLUS (SEPSIS)
250.0000 mL | Freq: Once | INTRAVENOUS | Status: AC
  Administered 2012-03-19: 250 mL via INTRAVENOUS

## 2012-03-19 MED ORDER — BUTAMBEN-TETRACAINE-BENZOCAINE 2-2-14 % EX AERO
INHALATION_SPRAY | CUTANEOUS | Status: DC | PRN
  Administered 2012-03-19: 2 via TOPICAL

## 2012-03-19 MED ORDER — SODIUM CHLORIDE 0.9 % IJ SOLN
INTRAMUSCULAR | Status: DC | PRN
  Administered 2012-03-19: 11:00:00

## 2012-03-19 MED ORDER — MIDAZOLAM HCL 10 MG/2ML IJ SOLN
INTRAMUSCULAR | Status: AC
  Filled 2012-03-19: qty 2

## 2012-03-19 MED ORDER — MIDAZOLAM HCL 10 MG/2ML IJ SOLN
INTRAMUSCULAR | Status: DC | PRN
  Administered 2012-03-19: 2 mg via INTRAVENOUS
  Administered 2012-03-19 (×3): 1 mg via INTRAVENOUS

## 2012-03-19 MED ORDER — FENTANYL CITRATE 0.05 MG/ML IJ SOLN
INTRAMUSCULAR | Status: DC | PRN
  Administered 2012-03-19 (×3): 25 ug via INTRAVENOUS

## 2012-03-19 MED ORDER — POTASSIUM CHLORIDE 10 MEQ/100ML IV SOLN
10.0000 meq | INTRAVENOUS | Status: AC
  Administered 2012-03-19 (×3): 10 meq via INTRAVENOUS
  Administered 2012-03-19: 10 meq
  Filled 2012-03-19: qty 400

## 2012-03-19 MED ORDER — SODIUM CHLORIDE 0.9 % IV SOLN
Freq: Once | INTRAVENOUS | Status: AC
  Administered 2012-03-19: 500 mL via INTRAVENOUS

## 2012-03-19 MED ORDER — EPINEPHRINE HCL 0.1 MG/ML IJ SOLN
INTRAMUSCULAR | Status: AC
  Filled 2012-03-19: qty 10

## 2012-03-19 NOTE — Progress Notes (Signed)
[facility]  Interim history: 78 y/o admitted with multiple episodes of syncope and found to have Pneumonia, anemia and was stool occult postitive.   Subjective: S/p EGD and Colonoscopy. I have explained the results and the fact that she needs to avoid ASA and other NSAIDS. She eats very spicy food and drinks coffee which I have also asked her to limit. She states she will f/u with GI here in [place] (lives in [place]).   Objective: Blood pressure 113/47, pulse 70, temperature 98.4 F (36.9 C), temperature source Oral, resp. rate 16, height 5\' 1"  (1.549 m), weight 49.2 kg (108 lb 7.5 oz), SpO2 99.00%. Weight change:   Intake/Output Summary (Last 24 hours) at 03/19/12 1349 Last data filed at 03/19/12 1200  Gross per 24 hour  Intake   1820 ml  Output    400 ml  Net   1420 ml    Physical Exam: General appearance: alert and cooperative Lungs: clear to auscultation bilaterally Heart: regular rate and rhythm, S1, S2 normal, no murmur, click, rub or gallop Abdomen: soft, non-tender; bowel sounds normal; no masses,  no organomegaly Extremities: extremities normal, atraumatic, no cyanosis or edema  Lab Results:  Basename 03/19/12 0430 03/18/12 1420  NA 145 144  K 2.9* 3.4*  CL 114* 110  CO2 22 25  GLUCOSE 88 96  BUN 24* 48*  CREATININE 0.64 0.73  CALCIUM 7.8* 8.1*  MG -- --  PHOS -- --    Basename 03/18/12 1420 03/17/12 2342  AST 15 17  ALT 10 10  ALKPHOS 38* 40  BILITOT 0.3 0.1*  PROT 5.0* 4.7*  ALBUMIN 2.3* 2.2*   No results found for this basename: LIPASE:2,AMYLASE:2 in the last 72 hours  Basename 03/19/12 0430 03/18/12 1420  WBC 12.4* 13.2*  NEUTROABS -- --  HGB 9.7* 10.2*  HCT 28.4* 29.7*  MCV 88.2 88.4  PLT 299 291    Basename 03/18/12 1420 03/17/12 2352  CKTOTAL 26 31  CKMB 1.8 2.0  CKMBINDEX -- --  TROPONINI <0.30 <0.30   No components found with this basename: POCBNP:3 No results found for this basename: DDIMER:2 in the last 72 hours No  results found for this basename: HGBA1C:2 in the last 72 hours No results found for this basename: CHOL:2,HDL:2,LDLCALC:2,TRIG:2,CHOLHDL:2,LDLDIRECT:2 in the last 72 hours No results found for this basename: TSH,T4TOTAL,FREET3,T3FREE,THYROIDAB in the last 72 hours  Basename 03/19/12 0430  VITAMINB12 357  FOLATE >20.0  FERRITIN 68  TIBC 262  IRON 113  RETICCTPCT 1.4    Micro Results: Recent Results (from the past 240 hour(s))  CULTURE, BLOOD (ROUTINE X 2)     Status: Normal (Preliminary result)   Collection Time   03/17/12 11:46 PM      Component Value Range Status Comment   Specimen Description BLOOD RIGHT ARM   Final    Special Requests BOTTLES DRAWN AEROBIC AND ANAEROBIC 5CC   Final    Culture  Setup Time [id number]   Final    Culture     Final    Value:        BLOOD CULTURE RECEIVED NO GROWTH TO DATE CULTURE WILL BE HELD FOR 5 DAYS BEFORE ISSUING A FINAL NEGATIVE REPORT   Report Status PENDING   Incomplete   CULTURE, BLOOD (ROUTINE X 2)     Status: Normal (Preliminary result)   Collection Time   03/17/12 11:51 PM      Component Value Range Status Comment   Specimen Description BLOOD RIGHT ARM  Final    Special Requests BOTTLES DRAWN AEROBIC AND ANAEROBIC 5CC   Final    Culture  Setup Time [id number]   Final    Culture     Final    Value:        BLOOD CULTURE RECEIVED NO GROWTH TO DATE CULTURE WILL BE HELD FOR 5 DAYS BEFORE ISSUING A FINAL NEGATIVE REPORT   Report Status PENDING   Incomplete   URINE CULTURE     Status: Normal   Collection Time   03/18/12 12:10 AM      Component Value Range Status Comment   Specimen Description URINE, CATHETERIZED   Final    Special Requests NONE   Final    Culture  Setup Time [id number]   Final    Colony Count NO GROWTH   Final    Culture NO GROWTH   Final    Report Status 03/19/2012 FINAL   Final   MRSA PCR SCREENING     Status: Normal   Collection Time   03/18/12  6:18 AM      Component Value Range Status Comment   MRSA by PCR  NEGATIVE  NEGATIVE  Final     Studies/Results: Dg Chest 2 View  03/18/2012  *RADIOLOGY REPORT*  Clinical Data: Syncope; nausea.  CHEST - 2 VIEW  Comparison: None.  Findings: The lungs are well-aerated.  Left midlung airspace opacity raises concern for pneumonia.  There is no evidence of pleural effusion or pneumothorax.  The heart is normal in size; the mediastinal contour is within normal limits. Moderate hiatal hernia is noted, with an air-fluid level.  No acute osseous abnormalities are seen.  There is slight loss of vertebral body height along the lower thoracic spine.  IMPRESSION:  1.  Left midlung airspace opacity raises concern for pneumonia.  If the patient has symptoms of pneumonia, would treat for pneumonia and then perform follow-up chest radiograph to ensure resolution of airspace opacity. 2.  Moderate hiatal hernia noted, with an air-fluid level.  Original Report Authenticated By: Tonia Ghent, M.D.   Ct Head Wo Contrast  03/18/2012  *RADIOLOGY REPORT*  Clinical Data: Nausea and flushing; syncope.  Hypotension.  CT HEAD WITHOUT CONTRAST  Technique:  Contiguous axial images were obtained from the base of the skull through the vertex without contrast.  Comparison: None.  Findings: There is no evidence of acute infarction, mass lesion, or intra- or extra-axial hemorrhage on CT.  Prominence of the ventricles and sulci reflects mild cortical volume loss.  Mild scattered periventricular and white matter change likely reflects small vessel ischemic microangiopathy.  The posterior fossa, including the cerebellum, brainstem and fourth ventricle, is within normal limits.  The basal ganglia are unremarkable in appearance.  The cerebral hemispheres demonstrate grossly normal gray-white differentiation.  No mass effect or midline shift is seen.  There is no evidence of fracture; visualized osseous structures are unremarkable in appearance.  The orbits are within normal limits. The paranasal sinuses and  mastoid air cells are well-aerated.  No significant soft tissue abnormalities are seen.  IMPRESSION:  1.  No acute intracranial pathology seen on CT. 2.  Mild cortical volume loss and scattered small vessel ischemic microangiopathy.  Original Report Authenticated By: Tonia Ghent, M.D.    Medications: Scheduled Meds:   . sodium chloride   Intravenous Once  . azithromycin  500 mg Intravenous QHS  . bisacodyl  5 mg Oral Q6H  . cefTRIAXone (ROCEPHIN)  IV  1 g Intravenous QHS  .  pantoprazole (PROTONIX) IV  40 mg Intravenous Q12H  . peg 3350 powder  1 kit Oral Once  . potassium chloride  10 mEq Intravenous Q1 Hr x 4  . potassium chloride  40 mEq Oral Once  . sodium chloride  3 mL Intravenous Q12H  . sodium chloride       Continuous Infusions:   . sodium chloride 75 mL/hr at 03/18/12 1600   PRN Meds:.acetaminophen, acetaminophen, ondansetron (ZOFRAN) IV, ondansetron, DISCONTD: butamben-tetracaine-benzocaine, DISCONTD: EPINEPHrine 1:10,000, 10 mL syringe/NS, 10 mL vial for sclerotherapy inj mixture, DISCONTD: fentaNYL, DISCONTD: midazolam  Assessment/Plan: Principal Problem:  *GI bleed Ulcers noted in stomach and transverse and descending colon consistent with NSAID uses. Awaiting h. Pylori antibody.  BID PPI x 12wks.  Pt does not take Plavix- please see Blase Mess   Anemia due to acute blood loss  Pt did not notice blood loss but drop in Hgb and normal Iron levels point toward this.  Replace w/ 2 units PRBC. Iron levels normal.   Pneumonia D/c rocephin- cont zithromax x 5 days. She is significantly improved. F/u wbc counts.    HTN (hypertension) Hypotensive and bradycardic in ER  Holding all hypertensives   Near syncope Due to dehydration/ hypotension and acute blood loss  Hypokalemia Due to colon prep? Replacing.    Colon polyps 4 polyps removed today- need to f/u biopsy  H/o colon CA Her colectomy for colon cancer was 16 yrs ago in [place]. She did not need adjuvant  therapy as nodes were negative.  Disposition OK to transfer to floor. Pt eval.    LOS: 2 days   [facility] [phone] 03/19/2012, 1:49 PM

## 2012-03-19 NOTE — Op Note (Signed)
[facility] [address] [place], [place]  [address]  ENDOSCOPY PROCEDURE REPORT  PATIENT:  Collyn, Selk  MR#:  [id number] BIRTHDATE:  1923-05-14, 88 yrs. old  GENDER:  female ENDOSCOPIST:  Carie Caddy. Alfhild Partch, MD Referred by:  [facility], PROCEDURE DATE:  03/19/2012 PROCEDURE:  EGD for control of bleeding ASA CLASS:  Class III INDICATIONS:  FOBT + stool, anemia MEDICATIONS:   Fentanyl 75 mcg IV, Versed 5 mg IV TOPICAL ANESTHETIC:  Cetacaine Spray  DESCRIPTION OF PROCEDURE:   After the risks benefits and alternatives of the procedure were thoroughly explained, informed consent was obtained.  The Pentax Gastroscope [id number] and [id number]) endoscope was introduced through the mouth and advanced to the second portion of the duodenum, without limitations.  The instrument was slowly withdrawn as the mucosa was fully examined. <<PROCEDUREIMAGES>>  A nonobstructing schatzki's ring was found at the gastroesophageal junction.  Tortuous in the distal esophagus.  Otherwise normal esophagus.  A hiatal hernia was found.  Two ulcers were found in the cardia, near the diaphragmatic hiatus (query Cameron's in etiology).  The largest was 2 cm with visible vessel. Epinephrine 1:10.000 was injected around the ulcer and then bipolar coagulation therapy was applied with success. The 2nd cardia ulcer was smaller and clean-based.   There was a shallow, clean based ulcer in the antrum without stigmata. The duodenal bulb was normal in appearance, as was the postbulbar duodenum.    Retroflexed views revealed findings as previously described.    The scope was then withdrawn from the patient and the procedure completed.  COMPLICATIONS:  None  ENDOSCOPIC IMPRESSION: 1) Non-obstructing schatzki's ring at the gastroesophageal junction 2) Tortuous in the distal esophagus 3) Otherwise normal esophagus 4) Hiatal hernia 5) 2 ulcers in the cardia, one with visible vessel.   Treated with success.  This is felt to be the cause of acute anemia. 6) Antral ulcer with clean base. 7) Normal duodenum  RECOMMENDATIONS: 1) No NSAIDs 2) Test H. Pylori serum antibody and treat if positive. 3) BID PO PPI for at least 12 weeks, then daily thereafter. 4) Follow blood counts to ensure improvement/normalization. 5) Can follow in GI clinic locally or with me in [facility]. Alexza Norbeck, MD  CC:  The Patient  n. eSIGNED:   Carie Caddy. Beula Joyner at 03/19/2012 11:37 AM  Debby Bud, [phone]

## 2012-03-19 NOTE — Progress Notes (Signed)
Patient c/o feeling weird. She states that she couldn't describe the feeling. Only to say id feels like she was fading away vital taken and MD page. Patient had small mod  bloody stool about 15 min prior to compliant.

## 2012-03-19 NOTE — Op Note (Signed)
[facility] [address] [place], [place]  [address]  COLONOSCOPY PROCEDURE REPORT  PATIENT:  Mary [name], Mary [name]  MR#:  [id number] BIRTHDATE:  1923/08/15, 88 yrs. old  GENDER:  female ENDOSCOPIST:  Carie Caddy. Soumya Colson, MD REF. BY:  [facility], PROCEDURE DATE:  03/19/2012 PROCEDURE:  Colonoscopy with biopsy and snare polypectomy, Colon with cold biopsy polypectomy ASA CLASS:  Class III INDICATIONS:  Anemia, history of colon cancer (remote) MEDICATIONS:   Fentanyl 75 mcg IV, Versed 5 mg IV  DESCRIPTION OF PROCEDURE:   After the risks benefits and alternatives of the procedure were thoroughly explained, informed consent was obtained.  Digital rectal exam was performed and revealed no rectal masses.   The Pentax Colonoscope [id number] and [id number]) endoscope was introduced through the anus and advanced to the cecum, which was identified by both the appendix and ileocecal valve, without limitations.  The quality of the prep was Moviprep fair.  The instrument was then slowly withdrawn as the colon was fully examined. <<PROCEDUREIMAGES>>  FINDINGS:  Two sessile polyps, 2 - 6 mm were found in the cecum. One polyp was removed using cold biopsy forceps. One Polyp was snared without cautery. Retrieval was successful. Two sessile polyps, 2 - 5 mm were found in the ascending colon. One Polyp was snared without cautery. Retrieval was successful. One polyp was removed using cold biopsy forceps.  Mild diverticulosis was found in the left colon.  A few, shallow ulcers were found in the transverse and descending colon. With standard forceps, biopsy was obtained and sent to pathology. Query NSAID induced.  The remaining colonic mucosa was unremarkable.  Surgical anastomosis found in proximal rectum, healthy appearing. Retroflexed views in the rectum revealed no abnormalities. The scope was then withdrawn from the cecum and the procedure completed.  COMPLICATIONS:   None ENDOSCOPIC IMPRESSION: 1) Two polyps in the cecum. Removed and sent to pathology. 2) Two polyps in the ascending colon. Removed and sent to pathology. 3) Mild diverticulosis in the left colon 4) Scattered ulcers in the transverse and descending colon. Other mucosa normal in appearance. Query NSAID related ulcers. Biopsies obtained. 5) Healthy appearing rectosigmoid anastomosis.  RECOMMENDATIONS: 1) Avoid all NSAIDs 2) Await pathology results 3) High fiber diet. 4) Given your age, you will not need another colonoscopy for colon cancer screening or polyp surveillance. These types of tests usually stop around the age 31.  Carie Caddy. Kawanda Drumheller, MD  CC:  The Patient  n. eSIGNED:   Carie Caddy. Shaquayla Klimas at 03/19/2012 11:46 AM  Debby Bud, [phone]

## 2012-03-19 NOTE — Progress Notes (Signed)
PT Cancellation Note   Treatment cancelled today due to patient receiving procedure or test. Pt is currently receiving colonoscopy. Will attempt evaluation tomorrow pending medical stability.  Thank you, 03/19/2012 Milana Kidney DPT PAGER: [phone] OFFICE: [phone]

## 2012-03-19 NOTE — Consult Note (Signed)
Patient seen, examined, and I agree with the above documentation, including the assessment and plan. Anemia felt more likely acute.  Functional status has been very good at home. Agree with EGD/colon given acute anemia and hx of colon cancer. The nature of the procedure, as well as the risks, benefits, and alternatives were carefully and thoroughly reviewed with the patient. Ample time for discussion and questions allowed. The patient understood, was satisfied, and agreed to proceed.  Further recs after procedures

## 2012-03-19 NOTE — Progress Notes (Signed)
Callahan,NP updated of patients hemoglobin level

## 2012-03-19 NOTE — Progress Notes (Signed)
MD ordered 250 Bolus and labs

## 2012-03-19 NOTE — Clinical Documentation Improvement (Signed)
GENERIC DOCUMENTATION CLARIFICATION QUERY  THIS DOCUMENT IS NOT A PERMANENT PART OF THE MEDICAL RECORD  TO RESPOND TO THE THIS QUERY, FOLLOW THE INSTRUCTIONS BELOW:  1. If needed, update documentation for the patient's encounter via the notes activity.  2. Access this query again and click edit on the In [employer].  3. After updating, or not, click F2 to complete all highlighted (required) fields concerning your review. Select "additional documentation in the medical record" OR "no additional documentation provided".  4. Click Sign note button.  5. The deficiency will fall out of your In Basket *Please let us know if you are not able to complete this workflow by phone or e-mail (listed below).  Please update your documentation within the medical record to reflect your response to this query.                                                                                        03/19/12   Dear Dr. Calvert Cantor / Associates,  In a better effort to capture your patient's severity of illness, reflect appropriate length of stay and utilization of resources, a review of the patient medical record has revealed the following indicators.    Based on your clinical judgment, please clarify and document in a progress note and/or discharge summary the clinical condition associated with the following supporting information:  You may use possible, probable, or suspect with inpatient documentation. possible, probable, suspected diagnoses MUST be documented at the time of discharge  In responding to this query please exercise your independent judgment.  The fact that a query is asked, does not imply that any particular answer is desired or expected.  Possible Clinical Conditions?  _______Congenital  Schatzki Ring  _______Other Condition__________________ _______Cannot Clinically Determine     Supporting Information:  Risk Factors: Per 5/10 GI procedure not,e nonobstructing schatzki's ring was  found at the gastroesophageal  junction.   Signs & Symptoms:  Diagnostics: 5/10 EDG.  Treatment :No NSAIDs,   BID PO PPI for at least 12 weeks, then daily thereafter.   Follow blood counts to ensure improvement/normalization.   Can follow in GI clinic locally or MD in [place].     Reviewed:  no additional documentation provided, MD has not responded to query.   Thank You,  Shelda Pal RN, BSN, CCM   Clinical Documentation Specialist:  Pager [phone]  Health Information Management [place]

## 2012-03-20 ENCOUNTER — Encounter (HOSPITAL_COMMUNITY)
Admission: EM | Disposition: A | Discharge status: Home / Self Care | Payer: Self-pay | Source: Ambulatory Visit | Attending: Internal Medicine

## 2012-03-20 ENCOUNTER — Encounter (HOSPITAL_COMMUNITY): Payer: Self-pay | Admitting: *Deleted

## 2012-03-20 DIAGNOSIS — IMO0002 Reserved for concepts with insufficient information to code with codable children: Secondary | ICD-10-CM | POA: Diagnosis not present

## 2012-03-20 DIAGNOSIS — I10 Essential (primary) hypertension: Secondary | ICD-10-CM

## 2012-03-20 DIAGNOSIS — E871 Hypo-osmolality and hyponatremia: Secondary | ICD-10-CM

## 2012-03-20 DIAGNOSIS — D696 Thrombocytopenia, unspecified: Secondary | ICD-10-CM

## 2012-03-20 DIAGNOSIS — K2901 Acute gastritis with bleeding: Secondary | ICD-10-CM

## 2012-03-20 HISTORY — PX: COLONOSCOPY: SHX5424

## 2012-03-20 HISTORY — PX: ESOPHAGOGASTRODUODENOSCOPY: SHX5428

## 2012-03-20 LAB — CBC
HCT: 24.1 % — ABNORMAL LOW (ref 36.0–46.0)
HCT: 19.6 % — ABNORMAL LOW (ref 36.0–46.0)
Hemoglobin: 8.1 g/dL — ABNORMAL LOW (ref 12.0–15.0)
Hemoglobin: 6.5 g/dL — CL (ref 12.0–15.0)
MCH: 29.7 pg (ref 26.0–34.0)
MCH: 29.7 pg (ref 26.0–34.0)
MCHC: 33.2 g/dL (ref 30.0–36.0)
MCV: 88.3 fL (ref 78.0–100.0)
MCV: 89.5 fL (ref 78.0–100.0)
RBC: 2.73 MIL/uL — ABNORMAL LOW (ref 3.87–5.11)
RDW: 15.4 % (ref 11.5–15.5)
WBC: 13.3 10*3/uL — ABNORMAL HIGH (ref 4.0–10.5)

## 2012-03-20 LAB — PREPARE RBC (CROSSMATCH)

## 2012-03-20 LAB — BASIC METABOLIC PANEL
BUN: 9 mg/dL (ref 6–23)
Calcium: 7.3 mg/dL — ABNORMAL LOW (ref 8.4–10.5)
Creatinine, Ser: 0.66 mg/dL (ref 0.50–1.10)
GFR calc non Af Amer: 78 mL/min — ABNORMAL LOW (ref >90)
Glucose, Bld: 119 mg/dL — ABNORMAL HIGH (ref 70–99)
Potassium: 3.9 mEq/L (ref 3.5–5.1)

## 2012-03-20 SURGERY — EGD (ESOPHAGOGASTRODUODENOSCOPY)
Anesthesia: Moderate Sedation

## 2012-03-20 SURGERY — COLONOSCOPY
Anesthesia: Moderate Sedation

## 2012-03-20 MED ORDER — PANTOPRAZOLE SODIUM 40 MG IV SOLR
40.0000 mg | Freq: Two times a day (BID) | INTRAVENOUS | Status: DC
  Administered 2012-03-20 – 2012-03-21 (×4): 40 mg via INTRAVENOUS
  Filled 2012-03-20 (×8): qty 40

## 2012-03-20 MED ORDER — SODIUM CHLORIDE 0.9 % IV BOLUS (SEPSIS)
500.0000 mL | Freq: Once | INTRAVENOUS | Status: AC
  Administered 2012-03-20: 500 mL via INTRAVENOUS

## 2012-03-20 MED ORDER — FUROSEMIDE 10 MG/ML IJ SOLN
20.0000 mg | Freq: Once | INTRAMUSCULAR | Status: AC
  Administered 2012-03-20: 20 mg via INTRAVENOUS
  Filled 2012-03-20 (×2): qty 2

## 2012-03-20 MED ORDER — ACETAMINOPHEN 325 MG PO TABS
650.0000 mg | ORAL_TABLET | Freq: Once | ORAL | Status: AC
  Administered 2012-03-20: 650 mg via ORAL
  Filled 2012-03-20: qty 1

## 2012-03-20 MED ORDER — MIDAZOLAM HCL 10 MG/2ML IJ SOLN
INTRAMUSCULAR | Status: DC | PRN
  Administered 2012-03-20 (×2): 2 mg via INTRAVENOUS

## 2012-03-20 MED ORDER — FENTANYL NICU IV SYRINGE 50 MCG/ML
INJECTION | INTRAMUSCULAR | Status: DC | PRN
  Administered 2012-03-20 (×2): 25 ug via INTRAVENOUS

## 2012-03-20 MED ORDER — BUTAMBEN-TETRACAINE-BENZOCAINE 2-2-14 % EX AERO
INHALATION_SPRAY | CUTANEOUS | Status: DC | PRN
  Administered 2012-03-20: 2 via TOPICAL

## 2012-03-20 MED ORDER — DIPHENHYDRAMINE HCL 50 MG/ML IJ SOLN
25.0000 mg | Freq: Once | INTRAMUSCULAR | Status: AC
  Administered 2012-03-20: 25 mg via INTRAVENOUS
  Filled 2012-03-20 (×2): qty 1

## 2012-03-20 MED ORDER — FENTANYL CITRATE 0.05 MG/ML IJ SOLN
INTRAMUSCULAR | Status: AC
  Filled 2012-03-20: qty 2

## 2012-03-20 MED ORDER — MIDAZOLAM HCL 10 MG/2ML IJ SOLN
INTRAMUSCULAR | Status: AC
  Filled 2012-03-20: qty 2

## 2012-03-20 NOTE — Progress Notes (Signed)
[facility] Progress Note  Subjective: Pt with multiple, approximately 4 episodes of dark (described as black by the patient) and bloody stools since procedures yesterday.  Last was 7:45 this am, and described as "small" and dark red by RN on floor today. Pt denies complaints.  No pain.  No n/v.   Pt eating full breakfast when I walked in (egg, bacon, biscuit, milk, juice)  Objective:  Vital signs in last 24 hours: Temp:  [97.3 F (36.3 C)-98.6 F (37 C)] 97.5 F (36.4 C) (05/11 0535) Pulse Rate:  [70-101] 95  (05/11 0535) Resp:  [16-77] 20  (05/11 0535) BP: (113-185)/(33-84) 124/74 mmHg (05/11 0535) SpO2:  [95 %-100 %] 98 % (05/11 0535) Last BM Date: 03/19/12 .Gen: awake, alert, NAD HEENT: anicteric, op clear CV: RRR Pulm: CTA b/l Abd: soft, NT/ND, +BS throughout Ext: no c/c/e Neuro: nonfocal   Intake/Output from previous day: 05/10 0701 - 05/11 0700 In: 1450 [P.O.:600; I.V.:450; IV Piggyback:400] Out: 3 [Stool:3] Intake/Output this shift:    Lab Results:  Basename 03/20/12 0236 03/19/12 1944 03/19/12 0430  WBC 13.3* 17.5* 12.4*  HGB 8.1* 8.9* 9.7*  HCT 24.1* 26.2* 28.4*  PLT 319 289 299   BMET  Basename 03/19/12 1944 03/19/12 0430 03/18/12 1420  NA 140 145 144  K 4.2 2.9* 3.4*  CL 114* 114* 110  CO2 18* 22 25  GLUCOSE 94 88 96  BUN 12 24* 48*  CREATININE 0.65 0.64 0.73  CALCIUM 7.3* 7.8* 8.1*   LFT  Basename 03/18/12 1420  PROT 5.0*  ALBUMIN 2.3*  AST 15  ALT 10  ALKPHOS 38*  BILITOT 0.3  BILIDIR --  IBILI --   PT/INR  Basename 03/18/12 1420  LABPROT 14.2  INR 1.08    Assessment / Plan: 78 yo female admitted with acute symptomatic anemia, +FOBT who was found to have gastric ulcers, one with visible vessel treated with epi injection and bipolar cautery.  Also colonoscopy with removal of 4 polyps (2 by cold forceps, 2 by cold snare).  1. Gastric ulcers/recent colonoscopy with polypectomy -- pt with additional bleeding overnight.   Hgb has dropped 1.8 g since 03/18/12.  Unclear if this is rebleeding from GU or from polypectomy sites.  The GU is more likely to bleed briskly and significantly.  The polyps were removed without heat, and were small, making the colon a more unlikely (though certainly not impossible source).  Most post-polypectomy bleeding when cautery is NOT used if immediate and not delayed.  It usually is self-limited. --Plan -- repeat EGD today to ensure this isn't an upper GI source (i.e rebleeding from recently treated GU) NPO now. Change PPI back to IV BID for now. --Follow H/H and may need additional pRBC transfusion. --EGD around 1 pm given she has just completed breakfast  Principal Problem:  *GI bleed Active Problems:  Anemia  Pneumonia  HTN (hypertension)  H/O colon cancer, stage I  Near syncope  Gastric ulcer, acute  Colon polyps  Colon ulcer     LOS: 3 days   Yaritzi Craun M  03/20/2012, 9:20 AM    

## 2012-03-20 NOTE — Progress Notes (Signed)
1115 Report called to nurse on 2900 Jacki Cones, RN) also daughter made aware of patient transfer and room number.

## 2012-03-20 NOTE — Progress Notes (Signed)
[facility]  Interim history: 78 y/o admitted with multiple episodes of syncope and found to have Pneumonia, anemia and was stool occult postitive.   Subjective: Multiple melanotic and frank bloody bowel movements overnight-Hb-down to 6.5   Objective: Blood pressure 124/74, pulse 95, temperature 97.5 F (36.4 C), temperature source Oral, resp. rate 20, height 5\' 1"  (1.549 m), weight 49.2 kg (108 lb 7.5 oz), SpO2 98.00%. Weight change:   Intake/Output Summary (Last 24 hours) at 03/20/12 1131 Last data filed at 03/19/12 2243  Gross per 24 hour  Intake   1275 ml  Output      3 ml  Net   1272 ml    Physical Exam: General appearance: alert and cooperative Lungs: clear to auscultation bilaterally Heart: regular rate and rhythm, S1, S2 normal, no murmur, click, rub or gallop Abdomen: soft, non-tender; bowel sounds normal; no masses,  no organomegaly Extremities: extremities normal, atraumatic, no cyanosis or edema  Lab Results:  [facility] 03/20/12 0938 03/19/12 1944  NA 144 140  K 3.9 4.2  CL 116* 114*  CO2 20 18*  GLUCOSE 119* 94  BUN 9 12  CREATININE 0.66 0.65  CALCIUM 7.3* 7.3*  MG -- --  PHOS -- --    Basename 03/18/12 1420 03/17/12 2342  AST 15 17  ALT 10 10  ALKPHOS 38* 40  BILITOT 0.3 0.1*  PROT 5.0* 4.7*  ALBUMIN 2.3* 2.2*   No results found for this basename: LIPASE:2,AMYLASE:2 in the last 72 hours  Basename 03/20/12 0938 03/20/12 0236  WBC 10.4 13.3*  NEUTROABS -- --  HGB 6.5* 8.1*  HCT 19.6* 24.1*  MCV 89.5 88.3  PLT 274 319    Basename 03/18/12 1420 03/17/12 2352  CKTOTAL 26 31  CKMB 1.8 2.0  CKMBINDEX -- --  TROPONINI <0.30 <0.30   No components found with this basename: POCBNP:3 No results found for this basename: DDIMER:2 in the last 72 hours No results found for this basename: HGBA1C:2 in the last 72 hours No results found for this basename: CHOL:2,HDL:2,LDLCALC:2,TRIG:2,CHOLHDL:2,LDLDIRECT:2 in the last 72 hours No results found  for this basename: TSH,T4TOTAL,FREET3,T3FREE,THYROIDAB in the last 72 hours  Basename 03/19/12 0430  VITAMINB12 357  FOLATE >20.0  FERRITIN 68  TIBC 262  IRON 113  RETICCTPCT 1.4    Micro Results: Recent Results (from the past 240 hour(s))  CULTURE, BLOOD (ROUTINE X 2)     Status: Normal (Preliminary result)   Collection Time   03/17/12 11:46 PM      Component Value Range Status Comment   Specimen Description BLOOD RIGHT ARM   Final    Special Requests BOTTLES DRAWN AEROBIC AND ANAEROBIC 5CC   Final    Culture  Setup Time [id number]   Final    Culture     Final    Value:        BLOOD CULTURE RECEIVED NO GROWTH TO DATE CULTURE WILL BE HELD FOR 5 DAYS BEFORE ISSUING A FINAL NEGATIVE REPORT   Report Status PENDING   Incomplete   CULTURE, BLOOD (ROUTINE X 2)     Status: Normal (Preliminary result)   Collection Time   03/17/12 11:51 PM      Component Value Range Status Comment   Specimen Description BLOOD RIGHT ARM   Final    Special Requests BOTTLES DRAWN AEROBIC AND ANAEROBIC 5CC   Final    Culture  Setup Time [id number]   Final    Culture     Final  Value:        BLOOD CULTURE RECEIVED NO GROWTH TO DATE CULTURE WILL BE HELD FOR 5 DAYS BEFORE ISSUING A FINAL NEGATIVE REPORT   Report Status PENDING   Incomplete   URINE CULTURE     Status: Normal   Collection Time   03/18/12 12:10 AM      Component Value Range Status Comment   Specimen Description URINE, CATHETERIZED   Final    Special Requests NONE   Final    Culture  Setup Time [id number]   Final    Colony Count NO GROWTH   Final    Culture NO GROWTH   Final    Report Status 03/19/2012 FINAL   Final   MRSA PCR SCREENING     Status: Normal   Collection Time   03/18/12  6:18 AM      Component Value Range Status Comment   MRSA by PCR NEGATIVE  NEGATIVE  Final   URINE CULTURE     Status: Normal   Collection Time   03/18/12  4:49 PM      Component Value Range Status Comment   Specimen Description URINE, CATHETERIZED   Final     Special Requests NONE   Final    Culture  Setup Time [id number]   Final    Colony Count NO GROWTH   Final    Culture NO GROWTH   Final    Report Status 03/19/2012 FINAL   Final     Studies/Results: Dg Chest 2 View  03/18/2012  *RADIOLOGY REPORT*  Clinical Data: Syncope; nausea.  CHEST - 2 VIEW  Comparison: None.  Findings: The lungs are well-aerated.  Left midlung airspace opacity raises concern for pneumonia.  There is no evidence of pleural effusion or pneumothorax.  The heart is normal in size; the mediastinal contour is within normal limits. Moderate hiatal hernia is noted, with an air-fluid level.  No acute osseous abnormalities are seen.  There is slight loss of vertebral body height along the lower thoracic spine.  IMPRESSION:  1.  Left midlung airspace opacity raises concern for pneumonia.  If the patient has symptoms of pneumonia, would treat for pneumonia and then perform follow-up chest radiograph to ensure resolution of airspace opacity. 2.  Moderate hiatal hernia noted, with an air-fluid level.  Original Report Authenticated By: Tonia Ghent, M.D.   Ct Head Wo Contrast  03/18/2012  *RADIOLOGY REPORT*  Clinical Data: Nausea and flushing; syncope.  Hypotension.  CT HEAD WITHOUT CONTRAST  Technique:  Contiguous axial images were obtained from the base of the skull through the vertex without contrast.  Comparison: None.  Findings: There is no evidence of acute infarction, mass lesion, or intra- or extra-axial hemorrhage on CT.  Prominence of the ventricles and sulci reflects mild cortical volume loss.  Mild scattered periventricular and white matter change likely reflects small vessel ischemic microangiopathy.  The posterior fossa, including the cerebellum, brainstem and fourth ventricle, is within normal limits.  The basal ganglia are unremarkable in appearance.  The cerebral hemispheres demonstrate grossly normal gray-white differentiation.  No mass effect or midline shift is seen.  There  is no evidence of fracture; visualized osseous structures are unremarkable in appearance.  The orbits are within normal limits. The paranasal sinuses and mastoid air cells are well-aerated.  No significant soft tissue abnormalities are seen.  IMPRESSION:  1.  No acute intracranial pathology seen on CT. 2.  Mild cortical volume loss and scattered small vessel ischemic microangiopathy.  Original Report Authenticated By: Tonia Ghent, M.D.    Medications: Scheduled Meds:    . acetaminophen  650 mg Oral Once  . azithromycin  500 mg Intravenous QHS  . diphenhydrAMINE  25 mg Intravenous Once  . furosemide  20 mg Intravenous Once  . pantoprazole (PROTONIX) IV  40 mg Intravenous Q12H  . potassium chloride  10 mEq Intravenous Q1 Hr x 4  . potassium chloride  40 mEq Oral Once  . sodium chloride  250 mL Intravenous Once  . sodium chloride  3 mL Intravenous Q12H  . sodium chloride      . DISCONTD: cefTRIAXone (ROCEPHIN)  IV  1 g Intravenous QHS  . DISCONTD: pantoprazole  40 mg Oral BID AC  . DISCONTD: pantoprazole (PROTONIX) IV  40 mg Intravenous Q12H   Continuous Infusions:    . sodium chloride 75 mL/hr at 03/20/12 0114   PRN Meds:.acetaminophen, acetaminophen, ondansetron (ZOFRAN) IV, ondansetron, DISCONTD: butamben-tetracaine-benzocaine, DISCONTD: EPINEPHrine 1:10,000, 10 mL syringe/NS, 10 mL vial for sclerotherapy inj mixture, DISCONTD: fentaNYL, DISCONTD: midazolam  Assessment/Plan: Principal Problem:  *GI bleed -likely rebleed from the gastric ulcer with visible vessel, for second lool EGD today -continue with PPI   Anemia due to acute blood loss  -Significant drop in Hb -will transfuse 2 more additional units of PRBC-had received 2 units earlier this admit   Pneumonia D/c rocephin- cont zithromax x 5 days. She is significantly improved. F/u wbc counts.    HTN (hypertension) Hypotensive and bradycardic in ER  Holding all hypertensives   Near syncope Due to dehydration/  hypotension and acute blood loss  Hypokalemia Due to colon prep? Replacing.    Colon polyps 4 polyps removed today- need to f/u biopsy  H/o colon CA Her colectomy for colon cancer was 16 yrs ago in [place]. She did not need adjuvant therapy as nodes were negative.  Disposition Transfer back to SDU.    LOS: 3 days   [facility] [phone] 03/20/2012, 11:31 AM

## 2012-03-20 NOTE — Evaluation (Signed)
Physical Therapy Evaluation Patient Details Name: Mary [name] MRN: [id number] DOB: 1923/08/10 Today's Date: 03/20/2012 Time: 2952-8413 PT Time Calculation (min): 8 min  PT Assessment / Plan / Recommendation Clinical Impression  Ms. Stanfill is 78 y/o female admitted with GIB presenting to PT today with generalized weakness affecting her PLF and independence. Will benefit physical therapy in the acute setting to address the below problem list so as to maximize her mobility and independence thereby increasing her safety for d/c home. Rec HHPT vs no f/u pending pt's progress while here in the hospital. I imagine once her hgb returns to normal her function will improve dramatically.     PT Assessment  Patient needs continued PT services    Follow Up Recommendations  Home health PT (vs no f/u pending progress)    Barriers to Discharge  None      lEquipment Recommendations  None recommended by PT    Recommendations for Other Services   None  Frequency Min 3X/week    Precautions / Restrictions Precautions Precautions: Fall Precaution Comments: decreased hemoglobin     Mobility  Bed Mobility Bed Mobility: Supine to Sit;Sit to Supine Supine to Sit: 6: Modified independent (Device/Increase time);HOB elevated;With rails (30 degrees) Sit to Supine: 6: Modified independent (Device/Increase time);HOB elevated (30 degrees) Transfers Transfers: Sit to Stand;Stand to Sit Sit to Stand: From bed;With upper extremity assist Stand to Sit: 5: Supervision;To bed;With upper extremity assist Details for Transfer Assistance: no physical assist needed but pt appearing generally weak with more effort for transfers Ambulation/Gait Ambulation/Gait Assistance Details: NT pt declined further ambulation today because she is feeling too weak; given her Hgb is 8.1 and has dropped since yesterday I did not push this    Exercises     PT Diagnosis: Difficulty walking;Generalized weakness  PT Problem  List: Decreased strength;Decreased activity tolerance;Decreased balance;Decreased mobility PT Treatment Interventions: DME instruction;Gait training;Stair training;Patient/family education;Functional mobility training;Therapeutic activities;Therapeutic exercise;Balance training;Neuromuscular re-education   PT Goals Acute Rehab PT Goals PT Goal Formulation: With patient Time For Goal Achievement: 04/03/12 Pt will go Sit to Stand: Independently PT Goal: Sit to Stand - Progress: Goal set today Pt will go Stand to Sit: Independently PT Goal: Stand to Sit - Progress: Goal set today Pt will Transfer Bed to Chair/Chair to Bed: Independently PT Transfer Goal: Bed to Chair/Chair to Bed - Progress: Goal set today Pt will Ambulate: >150 feet;Independently PT Goal: Ambulate - Progress: Goal set today Pt will Go Up / Down Stairs: 3-5 stairs;with modified independence;with least restrictive assistive device PT Goal: Up/Down Stairs - Progress: Goal set today Pt will Perform Home Exercise Program: Independently PT Goal: Perform Home Exercise Program - Progress: Goal set today  Visit Information  Last PT Received On: 03/20/12 Assistance Needed: +1    Subjective Data  Subjective: Im just feeling so weak right now.    Prior Functioning  Home Living Lives With: Alone Available Help at Discharge: Family;Available 24 hours/day (daughter has been staying with her) Type of Home: House Home Access: Stairs to enter [employer] of Steps: 3-4 Entrance Stairs-Rails: Can reach both Home Layout: Two level;Laundry or work area in [employer]: None Prior Function Level of Independence: Independent Able to Take Stairs?: Reciprically Driving: Yes Vocation: Retired Comments: still very active, does all her yard work, reports that just recently they asked her to stop going up on her roof Communication Communication: No difficulties    Cognition  Overall Cognitive Status:  Appears within functional limits for  tasks assessed/performed Arousal/Alertness: Awake/alert Orientation Level: Appears intact for tasks assessed Behavior During Session: [facility] for tasks performed    Extremity/Trunk Assessment Right Upper Extremity Assessment RUE ROM/Strength/Tone: Within functional levels RUE Sensation: WFL - Light Touch;WFL - Proprioception RUE Coordination: WFL - gross/fine motor Left Upper Extremity Assessment LUE ROM/Strength/Tone: Within functional levels LUE Sensation: WFL - Light Touch;WFL - Proprioception LUE Coordination: WFL - gross/fine motor Right Lower Extremity Assessment RLE ROM/Strength/Tone: Deficits RLE ROM/Strength/Tone Deficits: generally weak but not specifically test RLE Sensation: WFL - Light Touch;WFL - Proprioception RLE Coordination: WFL - gross/fine motor Left Lower Extremity Assessment LLE ROM/Strength/Tone: Deficits LLE ROM/Strength/Tone Deficits: generally weak but not specifically tested LLE Sensation: WFL - Proprioception;WFL - Light Touch LLE Coordination: WFL - gross/fine motor   Balance Balance Balance Assessed: Yes Static Sitting Balance Static Sitting - Balance Support: No upper extremity supported Static Sitting - Level of Assistance: 7: Independent Dynamic Standing Balance Dynamic Standing - Balance Support: No upper extremity supported Dynamic Standing - Level of Assistance: 5: Stand by assistance Dynamic Standing - Comments: pt able to turn a half circle with SBA but very gaurded with increased BOS and increased time to complete  End of Session PT - End of Session Equipment Utilized During Treatment: Gait belt Activity Tolerance: Patient tolerated treatment well;Patient limited by fatigue Patient left: in bed;with call bell/phone within reach;with nursing in room   [facility] HELEN 03/20/2012, 10:36 AM

## 2012-03-20 NOTE — H&P (View-Only) (Signed)
[facility] Progress Note  Subjective: Pt with multiple, approximately 4 episodes of dark (described as black by the patient) and bloody stools since procedures yesterday.  Last was 7:45 this am, and described as "small" and dark red by RN on floor today. Pt denies complaints.  No pain.  No n/v.   Pt eating full breakfast when I walked in (egg, bacon, biscuit, milk, juice)  Objective:  Vital signs in last 24 hours: Temp:  [97.3 F (36.3 C)-98.6 F (37 C)] 97.5 F (36.4 C) (05/11 0535) Pulse Rate:  [70-101] 95  (05/11 0535) Resp:  [16-77] 20  (05/11 0535) BP: (113-185)/(33-84) 124/74 mmHg (05/11 0535) SpO2:  [95 %-100 %] 98 % (05/11 0535) Last BM Date: 03/19/12 .Gen: awake, alert, NAD HEENT: anicteric, op clear CV: RRR Pulm: CTA b/l Abd: soft, NT/ND, +BS throughout Ext: no c/c/e Neuro: nonfocal   Intake/Output from previous day: 05/10 0701 - 05/11 0700 In: 1450 [P.O.:600; I.V.:450; IV Piggyback:400] Out: 3 [Stool:3] Intake/Output this shift:    Lab Results:  Basename 03/20/12 0236 03/19/12 1944 03/19/12 0430  WBC 13.3* 17.5* 12.4*  HGB 8.1* 8.9* 9.7*  HCT 24.1* 26.2* 28.4*  PLT 319 289 299   BMET  Basename 03/19/12 1944 03/19/12 0430 03/18/12 1420  NA 140 145 144  K 4.2 2.9* 3.4*  CL 114* 114* 110  CO2 18* 22 25  GLUCOSE 94 88 96  BUN 12 24* 48*  CREATININE 0.65 0.64 0.73  CALCIUM 7.3* 7.8* 8.1*   LFT  Basename 03/18/12 1420  PROT 5.0*  ALBUMIN 2.3*  AST 15  ALT 10  ALKPHOS 38*  BILITOT 0.3  BILIDIR --  IBILI --   PT/INR  Basename 03/18/12 1420  LABPROT 14.2  INR 1.08    Assessment / Plan: 78 yo female admitted with acute symptomatic anemia, +FOBT who was found to have gastric ulcers, one with visible vessel treated with epi injection and bipolar cautery.  Also colonoscopy with removal of 4 polyps (2 by cold forceps, 2 by cold snare).  1. Gastric ulcers/recent colonoscopy with polypectomy -- pt with additional bleeding overnight.   Hgb has dropped 1.8 g since 03/18/12.  Unclear if this is rebleeding from GU or from polypectomy sites.  The GU is more likely to bleed briskly and significantly.  The polyps were removed without heat, and were small, making the colon a more unlikely (though certainly not impossible source).  Most post-polypectomy bleeding when cautery is NOT used if immediate and not delayed.  It usually is self-limited. --Plan -- repeat EGD today to ensure this isn't an upper GI source (i.e rebleeding from recently treated GU) NPO now. Change PPI back to IV BID for now. --Follow H/H and may need additional pRBC transfusion. --EGD around 1 pm given she has just completed breakfast  Principal Problem:  *GI bleed Active Problems:  Anemia  Pneumonia  HTN (hypertension)  H/O colon cancer, stage I  Near syncope  Gastric ulcer, acute  Colon polyps  Colon ulcer     LOS: 3 days   Deldrick Linch M  03/20/2012, 9:20 AM

## 2012-03-20 NOTE — Op Note (Signed)
[facility] [address] [place], [place]  [address]  COLONOSCOPY PROCEDURE REPORT  PATIENT:  Michalene, Debruler  MR#:  [id number] BIRTHDATE:  1923-01-03, 88 yrs. old  GENDER:  female ENDOSCOPIST:  Carie Caddy. Jaylyn Iyer, MD REF. BY:  [facility], PROCEDURE DATE:  03/20/2012 PROCEDURE:  Colon w/ endoscopic clipping ASA CLASS:  Class III INDICATIONS:  hematochezia after colonoscopy yesterday with polypectomy MEDICATIONS:   Versed 4 mg IV, Fentanyl 50 mcg IV  DESCRIPTION OF PROCEDURE:   After the risks benefits and alternatives of the procedure were thoroughly explained, informed consent was obtained.  Digital rectal exam was performed and revealed no rectal masses.   The Pentax Ped Colon [id number] endoscope was introduced through the anus and advanced to the cecum, limited by blood.    The quality of the prep was none.  The instrument was then slowly withdrawn as the colon was fully examined. <<PROCEDUREIMAGES>> FINDINGS:  Blood, mostly clotted and dark, was found throughout the colon. A prior polypectomy site was seen in the ascending colon. There was no active bleeding from this site, however and endoscopic clip was placed with success. There was a large blood clot in stool in the cecum that could not be moved despite attempts at irrigation and lavage.  There was no apparent active hemorrhage from the cecum or ascending colon during this exam. Retroflexion was not performed.  The scope was then withdrawn from the cecum and the procedure completed.  COMPLICATIONS:  None  ENDOSCOPIC IMPRESSION: 1) Blood and clots throughout the colon as a result of post polypectomy bleeding 2) Prior polypectomy site in the ascending colon, without active bleeding. Endo clip placed successfully 3) Blood clot and stool in cecum without active bleeding, unable to visualize cecal  polypectomy sites.  RECOMMENDATIONS: 1) Transfuse 2 additional units of packed red blood cells  (4 total for today) 2) Monitor hemoglobin and hematocrit every 6 hours 3) If ongoing bleeding, repeat colonoscopy after bowel preparation 4) Clears only for now 5) Patient to be monitored in step down unit  Carie Caddy. Laray Rivkin, MD  CC:  The Patient  n. eSIGNED:   Carie Caddy. Aylah Yeary at 03/20/2012 02:35 PM  Debby Bud, [phone]

## 2012-03-20 NOTE — Progress Notes (Signed)
Patient had 4 episode of bloody stool. TWillia Craze updated and ordered lab work up.

## 2012-03-20 NOTE — Interval H&P Note (Signed)
History and Physical Interval Note: Decision made before EGD to proceed directly to unprepared colonoscopy if EGD negative, given the recent hematochezia and very high suspicion of post-polyp bleeding (if EGD neg). Consent obtained from the patient at bedside.  03/20/2012 2:13 PM

## 2012-03-20 NOTE — Op Note (Signed)
[facility] [address] [place], [place]  [address]  ENDOSCOPY PROCEDURE REPORT  PATIENT:  Mary [name], Mary [name]  MR#:  [id number] BIRTHDATE:  1923-10-04, 88 yrs. old  GENDER:  female ENDOSCOPIST:  Mary Caddy. Daun Rens, Mary [name] Referred by:  [facility] PROCEDURE DATE:  03/20/2012 PROCEDURE:  EGD, diagnostic [address] ASA CLASS:  Class III INDICATIONS:  hematochezia, anemia MEDICATIONS:   Fentanyl 50 mcg IV, Versed 4 mg IV TOPICAL ANESTHETIC:  Cetacaine Spray  DESCRIPTION OF PROCEDURE:   After the risks benefits and alternatives of the procedure were thoroughly explained, informed consent was obtained.  The Pentax Gastroscope [id number] and [phone] endoscope was introduced through the mouth and advanced to the second portion of the duodenum, without limitations.  The instrument was slowly withdrawn as the mucosa was fully examined. <<PROCEDUREIMAGES>>  A non-obstructing Schatzki's ring was found at the gastroesophageal junction.  Otherwise normal esophagus.  A hiatal hernia was found.  Multiple ulcers were found in the cardia and antrum, as seen yesterday.  The largest ulcer in the gastric cardia was not bleeding, and the therapeutic intervention performed yesterday was evident. No blood in the stomach or stigmata of recent bleeding on any of the gastric ulcers.   The duodenal bulb was normal in appearance, as was the postbulbar duodenum.  No blood found in the proximal duodenum  Retroflexed views revealed findings as previously described.    The scope was then withdrawn from the patient and the procedure completed.  COMPLICATIONS:  None  ENDOSCOPIC IMPRESSION: 1) Non-obstructing Schatzki's ring at the gastroesophageal junction 2) Otherwise normal esophagus 3) Hiatal hernia 4) Ulcers, multiple in the cardia and antrum. No active or recent bleeding 5) Normal duodenum. No blood in the examined portions of the duodenum  RECOMMENDATIONS: 1) Proceed to  colonoscopy as source if felt to be post-polypectomy bleeding. 2) Continue BID PPI for gastric ulcerations. 3) Follow-up H. Pylori serology.  Mary Caddy. Savvy Peeters, Mary [name]  CC:  The Patient  n. eSIGNED:   Carie Caddy. Grae [name] at 03/20/2012 02:25 PM  Debby Bud, [phone]

## 2012-03-21 DIAGNOSIS — D696 Thrombocytopenia, unspecified: Secondary | ICD-10-CM

## 2012-03-21 DIAGNOSIS — I10 Essential (primary) hypertension: Secondary | ICD-10-CM

## 2012-03-21 DIAGNOSIS — E871 Hypo-osmolality and hyponatremia: Secondary | ICD-10-CM

## 2012-03-21 DIAGNOSIS — K2901 Acute gastritis with bleeding: Secondary | ICD-10-CM

## 2012-03-21 LAB — CBC
HCT: 31.6 % — ABNORMAL LOW (ref 36.0–46.0)
Hemoglobin: 11.5 g/dL — ABNORMAL LOW (ref 12.0–15.0)
MCHC: 36.4 g/dL — ABNORMAL HIGH (ref 30.0–36.0)
MCV: 84.5 fL (ref 78.0–100.0)
WBC: 10 10*3/uL (ref 4.0–10.5)

## 2012-03-21 LAB — BASIC METABOLIC PANEL
BUN: 5 mg/dL — ABNORMAL LOW (ref 6–23)
CO2: 21 mEq/L (ref 19–32)
Chloride: 111 mEq/L (ref 96–112)
Chloride: 109 mEq/L (ref 96–112)
Creatinine, Ser: 0.62 mg/dL (ref 0.50–1.10)
Glucose, Bld: 82 mg/dL (ref 70–99)
Glucose, Bld: 83 mg/dL (ref 70–99)
Potassium: 2.9 mEq/L — ABNORMAL LOW (ref 3.5–5.1)
Sodium: 139 mEq/L (ref 135–145)

## 2012-03-21 LAB — MAGNESIUM: Magnesium: 1.2 mg/dL — ABNORMAL LOW (ref 1.5–2.5)

## 2012-03-21 MED ORDER — DILTIAZEM HCL ER 240 MG PO CP24
240.0000 mg | ORAL_CAPSULE | Freq: Every day | ORAL | Status: DC
  Administered 2012-03-21 – 2012-03-23 (×3): 240 mg via ORAL
  Filled 2012-03-21 (×3): qty 1

## 2012-03-21 MED ORDER — LOSARTAN POTASSIUM-HCTZ 100-25 MG PO TABS: 1.0000 | ORAL_TABLET | Freq: Every day | ORAL | Status: DC

## 2012-03-21 MED ORDER — POTASSIUM CHLORIDE 10 MEQ/100ML IV SOLN
10.0000 meq | INTRAVENOUS | Status: AC
  Administered 2012-03-21 (×4): 10 meq via INTRAVENOUS
  Filled 2012-03-21 (×4): qty 100

## 2012-03-21 MED ORDER — SODIUM CHLORIDE 0.9 % IV SOLN
INTRAVENOUS | Status: DC
  Administered 2012-03-21: 05:00:00 via INTRAVENOUS

## 2012-03-21 MED ORDER — HYDROCHLOROTHIAZIDE 25 MG PO TABS
25.0000 mg | ORAL_TABLET | Freq: Every day | ORAL | Status: DC
  Administered 2012-03-21 – 2012-03-22 (×2): 25 mg via ORAL
  Filled 2012-03-21 (×2): qty 1

## 2012-03-21 MED ORDER — LOSARTAN POTASSIUM 50 MG PO TABS
100.0000 mg | ORAL_TABLET | Freq: Every day | ORAL | Status: DC
  Administered 2012-03-21 – 2012-03-23 (×3): 100 mg via ORAL
  Filled 2012-03-21 (×3): qty 2

## 2012-03-21 MED ORDER — MAGNESIUM OXIDE 400 (241.3 MG) MG PO TABS
400.0000 mg | ORAL_TABLET | Freq: Two times a day (BID) | ORAL | Status: AC
  Administered 2012-03-21: 400 mg via ORAL
  Filled 2012-03-21 (×2): qty 1

## 2012-03-21 MED ORDER — POTASSIUM CHLORIDE CRYS ER 20 MEQ PO TBCR
40.0000 meq | EXTENDED_RELEASE_TABLET | Freq: Once | ORAL | Status: AC
  Administered 2012-03-21: 40 meq via ORAL
  Filled 2012-03-21: qty 2

## 2012-03-21 MED ORDER — MAGNESIUM SULFATE 40 MG/ML IJ SOLN
2.0000 g | Freq: Once | INTRAMUSCULAR | Status: AC
  Administered 2012-03-21: 2 g via INTRAVENOUS
  Filled 2012-03-21: qty 50

## 2012-03-21 NOTE — Progress Notes (Signed)
[facility]  Interim history: 78 y/o admitted 5/9 with multiple episodes of syncope and found to have Pneumonia, anemia and was stool occult postitive. She never noticed any blood loss. She was taking ASA daily and occasional Ibuprofen. Underwent EGD and colonoscopy on 5/10 and was found to have multiple ulcers - suspected to be due to NSAID use. Large ulcer in stomach had a visible vessel which was not bleeding. 4 polyps removed. The evening of the endoscopies, developed bloody stools and went back for colonoscopy on 5/11. Noted to have large amount of blood and clot in colon and is suspected to have bled from polypectomy sites.   Subjective: Passing old blood now. PVCs on monitor. She has no complaints.   Objective: Blood pressure 146/54, pulse 65, temperature 98.6 F (37 C), temperature source Oral, resp. rate 20, height 5\' 1"  (1.549 m), weight 49.4 kg (108 lb 14.5 oz), SpO2 96.00%. Weight change:   Intake/Output Summary (Last 24 hours) at 03/21/12 1627 Last data filed at 03/21/12 1337  Gross per 24 hour  Intake 2601.5 ml  Output   3025 ml  Net -423.5 ml    Physical Exam: General appearance: alert and cooperative Lungs: clear to auscultation bilaterally Heart: regular rate and rhythm, S1, S2 normal, no murmur, click, rub or gallop Abdomen: soft, non-tender; bowel sounds normal; no masses,  no organomegaly Extremities: extremities normal, atraumatic, no cyanosis or edema  Lab Results:  Basename 03/21/12 1000 03/21/12 0555 03/20/12 0938  NA -- 142 144  K -- 2.9* 3.9  CL -- 111 116*  CO2 -- 21 20  GLUCOSE -- 82 119*  BUN -- 5* 9  CREATININE -- 0.62 0.66  CALCIUM -- 6.6* 7.3*  MG 1.2* -- --  PHOS -- -- --   No results found for this basename: AST:2,ALT:2,ALKPHOS:2,BILITOT:2,PROT:2,ALBUMIN:2 in the last 72 hours No results found for this basename: LIPASE:2,AMYLASE:2 in the last 72 hours  Basename 03/21/12 1520 03/21/12 0555 03/20/12 0938  WBC -- 10.0 10.4    NEUTROABS -- -- --  HGB 12.1 11.5* --  HCT 34.1* 31.6* --  MCV -- 84.5 89.5  PLT -- 183 274   No results found for this basename: CKTOTAL:3,CKMB:3,CKMBINDEX:3,TROPONINI:3 in the last 72 hours No components found with this basename: POCBNP:3 No results found for this basename: DDIMER:2 in the last 72 hours No results found for this basename: HGBA1C:2 in the last 72 hours No results found for this basename: CHOL:2,HDL:2,LDLCALC:2,TRIG:2,CHOLHDL:2,LDLDIRECT:2 in the last 72 hours No results found for this basename: TSH,T4TOTAL,FREET3,T3FREE,THYROIDAB in the last 72 hours  Basename 03/19/12 0430  VITAMINB12 357  FOLATE >20.0  FERRITIN 68  TIBC 262  IRON 113  RETICCTPCT 1.4    Micro Results: Recent Results (from the past 240 hour(s))  CULTURE, BLOOD (ROUTINE X 2)     Status: Normal (Preliminary result)   Collection Time   03/17/12 11:46 PM      Component Value Range Status Comment   Specimen Description BLOOD RIGHT ARM   Final    Special Requests BOTTLES DRAWN AEROBIC AND ANAEROBIC 5CC   Final    Culture  Setup Time [id number]   Final    Culture     Final    Value:        BLOOD CULTURE RECEIVED NO GROWTH TO DATE CULTURE WILL BE HELD FOR 5 DAYS BEFORE ISSUING A FINAL NEGATIVE REPORT   Report Status PENDING   Incomplete   CULTURE, BLOOD (ROUTINE X 2)     Status:  Normal (Preliminary result)   Collection Time   03/17/12 11:51 PM      Component Value Range Status Comment   Specimen Description BLOOD RIGHT ARM   Final    Special Requests BOTTLES DRAWN AEROBIC AND ANAEROBIC 5CC   Final    Culture  Setup Time [id number]   Final    Culture     Final    Value:        BLOOD CULTURE RECEIVED NO GROWTH TO DATE CULTURE WILL BE HELD FOR 5 DAYS BEFORE ISSUING A FINAL NEGATIVE REPORT   Report Status PENDING   Incomplete   URINE CULTURE     Status: Normal   Collection Time   03/18/12 12:10 AM      Component Value Range Status Comment   Specimen Description URINE, CATHETERIZED   Final     Special Requests NONE   Final    Culture  Setup Time [id number]   Final    Colony Count NO GROWTH   Final    Culture NO GROWTH   Final    Report Status 03/19/2012 FINAL   Final   MRSA PCR SCREENING     Status: Normal   Collection Time   03/18/12  6:18 AM      Component Value Range Status Comment   MRSA by PCR NEGATIVE  NEGATIVE  Final   URINE CULTURE     Status: Normal   Collection Time   03/18/12  4:49 PM      Component Value Range Status Comment   Specimen Description URINE, CATHETERIZED   Final    Special Requests NONE   Final    Culture  Setup Time [id number]   Final    Colony Count NO GROWTH   Final    Culture NO GROWTH   Final    Report Status 03/19/2012 FINAL   Final     Studies/Results: Dg Chest 2 View  03/18/2012  *RADIOLOGY REPORT*  Clinical Data: Syncope; nausea.  CHEST - 2 VIEW  Comparison: None.  Findings: The lungs are well-aerated.  Left midlung airspace opacity raises concern for pneumonia.  There is no evidence of pleural effusion or pneumothorax.  The heart is normal in size; the mediastinal contour is within normal limits. Moderate hiatal hernia is noted, with an air-fluid level.  No acute osseous abnormalities are seen.  There is slight loss of vertebral body height along the lower thoracic spine.  IMPRESSION:  1.  Left midlung airspace opacity raises concern for pneumonia.  If the patient has symptoms of pneumonia, would treat for pneumonia and then perform follow-up chest radiograph to ensure resolution of airspace opacity. 2.  Moderate hiatal hernia noted, with an air-fluid level.  Original Report Authenticated By: Tonia Ghent, M.D.   Ct Head Wo Contrast  03/18/2012  *RADIOLOGY REPORT*  Clinical Data: Nausea and flushing; syncope.  Hypotension.  CT HEAD WITHOUT CONTRAST  Technique:  Contiguous axial images were obtained from the base of the skull through the vertex without contrast.  Comparison: None.  Findings: There is no evidence of acute infarction, mass lesion,  or intra- or extra-axial hemorrhage on CT.  Prominence of the ventricles and sulci reflects mild cortical volume loss.  Mild scattered periventricular and white matter change likely reflects small vessel ischemic microangiopathy.  The posterior fossa, including the cerebellum, brainstem and fourth ventricle, is within normal limits.  The basal ganglia are unremarkable in appearance.  The cerebral hemispheres demonstrate grossly normal gray-white differentiation.  No mass  effect or midline shift is seen.  There is no evidence of fracture; visualized osseous structures are unremarkable in appearance.  The orbits are within normal limits. The paranasal sinuses and mastoid air cells are well-aerated.  No significant soft tissue abnormalities are seen.  IMPRESSION:  1.  No acute intracranial pathology seen on CT. 2.  Mild cortical volume loss and scattered small vessel ischemic microangiopathy.  Original Report Authenticated By: Tonia Ghent, M.D.    Medications: Scheduled Meds:    . azithromycin  500 mg Intravenous QHS  . diltiazem  240 mg Oral Daily  . furosemide  20 mg Intravenous Once  . losartan  100 mg Oral Daily   And  . hydrochlorothiazide  25 mg Oral Daily  . magnesium oxide  400 mg Oral BID  . magnesium sulfate 1 - 4 g bolus IVPB  2 g Intravenous Once  . pantoprazole (PROTONIX) IV  40 mg Intravenous Q12H  . potassium chloride  10 mEq Intravenous Q1 Hr x 4  . potassium chloride  40 mEq Oral Once  . sodium chloride  500 mL Intravenous Once  . sodium chloride  3 mL Intravenous Q12H  . DISCONTD: losartan-hydrochlorothiazide  1 tablet Oral Daily   Continuous Infusions:    . DISCONTD: sodium chloride 75 mL/hr at 03/20/12 2041  . DISCONTD: sodium chloride 20 mL/hr at 03/21/12 0430   PRN Meds:.acetaminophen, acetaminophen, ondansetron (ZOFRAN) IV, ondansetron  Assessment/Plan: Principal Problem:  *GI bleed - initial due to multiple ulcers from NSAID use - recurrent bleed was  polypectomy related -continue with PPI- diet per GI- clears today - avoid all nsaids and ASA for now- will need to reconfirm understanding w/ pt. I reviewed it 2 days ago.  - f/u h. Pylori serology   Anemia due to acute blood loss  -Significant drop in Hb -4 units of PRBC total transfused.    Pneumonia D/c'd rocephin- cont zithromax x 5 days - started 5/9. She is significantly improved. WBC normalized.   HTN (hypertension) Hypotensive and bradycardic in ER  Resuming meds today.    Near syncope Due to dehydration/ hypotension and acute blood loss  Hypokalemia Replacing.   Hypomagnesemia Replacing.   Colon polyps 4 polyps removed- need to f/u biopsy  H/o colon CA Her colectomy for colon cancer was 16 yrs ago in [place]. She did not need adjuvant therapy as nodes were negative.  Disposition Maintain in SDU.     LOS: 4 days   [facility] [phone] 03/21/2012, 4:27 PM

## 2012-03-21 NOTE — Progress Notes (Signed)
Dr.Jenkins paged concerning pt.'s I/O's status and to give latest H/H status. Have turned down fluids to [facility] with NS.

## 2012-03-21 NOTE — Progress Notes (Signed)
Spoke with Lenny Pastel NP, received orders for [facility] fluids based on I/O's and also the fact that pt. Had been feeling general tight/edematous feeling earlier in shift.

## 2012-03-21 NOTE — Progress Notes (Signed)
Patient ID: Mary [name], female   DOB: 1923/02/15, 78 y.o.   MRN: [id number]  Gastroenterology Progress Note  Subjective: Feels better, no c/o  this am, Has passed some old blood last night, ,stool brown with flecks of old blood per nursing this am.   Objective:  Vital signs in last 24 hours: Temp:  [98 F (36.7 C)-99.4 F (37.4 C)] 98.6 F (37 C) (05/12 0757) Pulse Rate:  [59-105] 65  (05/12 0700) Resp:  [14-28] 20  (05/12 0700) BP: (61-148)/(30-80) 146/54 mmHg (05/12 0700) SpO2:  [95 %-100 %] 96 % (05/12 0700) Weight:  [108 lb 14.5 oz (49.4 kg)] 108 lb 14.5 oz (49.4 kg) (05/12 0500) Last BM Date: 03/20/12 General:   Alert,  Well-developed,    in NAD Heart:  Regular rate and rhythm; no murmurs Pulm;clear Abdomen:  Soft, nontender and nondistended. Normal bowel sounds,  Extremities:  Without edema. Neurologic:  Alert and  oriented x4;  grossly normal neurologically. Psych:  Alert and cooperative. Normal mood and affect.  Intake/Output from previous day: 05/11 0701 - 05/12 0700 In: 7242.3 [I.V.:5641.1; Blood:1339.2; IV Piggyback:262] Out: 3025 [Urine:3025] Intake/Output this shift:    Lab Results:  Basename 03/21/12 0555 03/21/12 0013 03/20/12 0938 03/20/12 0236  WBC 10.0 -- 10.4 13.3*  HGB 11.5* 11.9* 6.5* --  HCT 31.6* 33.0* 19.6* --  PLT 183 -- 274 319   BMET  Basename 03/21/12 0555 03/20/12 0938 03/19/12 1944  NA 142 144 140  K 2.9* 3.9 4.2  CL 111 116* 114*  CO2 21 20 18*  GLUCOSE 82 119* 94  BUN 5* 9 12  CREATININE 0.62 0.66 0.65  CALCIUM 6.6* 7.3* 7.3*   LFT  Basename 03/18/12 1420  PROT 5.0*  ALBUMIN 2.3*  AST 15  ALT 10  ALKPHOS 38*  BILITOT 0.3  BILIDIR --  IBILI --   PT/INR  Basename 03/18/12 1420  LABPROT 14.2  INR 1.08    Assessment / Plan: #1 78 yo female stable s/p post polypectomy bleed yesterday-2 small polyps removed from cecum 5/10 ;one was clipped 5/11, other site with large adherent clot. No evidence fro  recurrent active bleed Advance to clear liquids, continue close observation #2 stable s/p acute bleed secondary to gastric ulcers- endoscopic treatment 5/10- continue BID PPI #3 hypotension secondary to above-resolved #4 hypokalemia -replacing #5 anemia secondary to acute blood loss-stable post 4 units . #6 hx of colon CA  Principal Problem:  *GI bleed Active Problems:  Anemia  Pneumonia  HTN (hypertension)  H/O colon cancer, stage I  Near syncope  Gastric ulcer, acute  Colon polyps  Colon ulcer  Post-polypectomy bleeding     LOS: 4 days   Shakeila Pfarr  03/21/2012, 8:56 AM

## 2012-03-21 NOTE — Progress Notes (Signed)
When pt. Has been voiding through the night, she also has been moving bowels, noted blood and clots with urination . Have monitored pt., V/S through the night. Pt. Was able to close her eyes and sleep for couple of hours without having to urinate/move bowels.

## 2012-03-21 NOTE — Progress Notes (Signed)
Report from Night RN. Chart reviewed together. Handoff complete.  

## 2012-03-21 NOTE — Progress Notes (Signed)
Patient seen, examined, and I agree with the above documentation, including the assessment and plan. Feels better, looks better No further hematochezia. Replace K, ordered already F/U H pylori status Monitor H/H

## 2012-03-22 ENCOUNTER — Encounter: Payer: Self-pay | Admitting: Internal Medicine

## 2012-03-22 DIAGNOSIS — K922 Gastrointestinal hemorrhage, unspecified: Secondary | ICD-10-CM

## 2012-03-22 DIAGNOSIS — D62 Acute posthemorrhagic anemia: Secondary | ICD-10-CM | POA: Clinically undetermined

## 2012-03-22 DIAGNOSIS — K2901 Acute gastritis with bleeding: Secondary | ICD-10-CM

## 2012-03-22 DIAGNOSIS — IMO0002 Reserved for concepts with insufficient information to code with codable children: Secondary | ICD-10-CM

## 2012-03-22 DIAGNOSIS — D696 Thrombocytopenia, unspecified: Secondary | ICD-10-CM

## 2012-03-22 DIAGNOSIS — K253 Acute gastric ulcer without hemorrhage or perforation: Secondary | ICD-10-CM

## 2012-03-22 DIAGNOSIS — E871 Hypo-osmolality and hyponatremia: Secondary | ICD-10-CM

## 2012-03-22 DIAGNOSIS — I10 Essential (primary) hypertension: Secondary | ICD-10-CM

## 2012-03-22 LAB — BASIC METABOLIC PANEL
BUN: 4 mg/dL — ABNORMAL LOW (ref 6–23)
CO2: 23 mEq/L (ref 19–32)
Chloride: 105 mEq/L (ref 96–112)
Glucose, Bld: 144 mg/dL — ABNORMAL HIGH (ref 70–99)
Potassium: 3.5 mEq/L (ref 3.5–5.1)
Sodium: 137 mEq/L (ref 135–145)

## 2012-03-22 LAB — TYPE AND SCREEN
Unit division: 0
Unit division: 0
Unit division: 0
Unit division: 0

## 2012-03-22 LAB — CBC
HCT: 33 % — ABNORMAL LOW (ref 36.0–46.0)
Hemoglobin: 11.4 g/dL — ABNORMAL LOW (ref 12.0–15.0)
MCH: 30.2 pg (ref 26.0–34.0)
MCV: 87.3 fL (ref 78.0–100.0)
RBC: 3.78 MIL/uL — ABNORMAL LOW (ref 3.87–5.11)

## 2012-03-22 MED ORDER — PANTOPRAZOLE SODIUM 40 MG PO TBEC
40.0000 mg | DELAYED_RELEASE_TABLET | Freq: Two times a day (BID) | ORAL | Status: DC
  Administered 2012-03-22 – 2012-03-23 (×2): 40 mg via ORAL
  Filled 2012-03-22 (×3): qty 1

## 2012-03-22 MED ORDER — MAGNESIUM GLUCONATE 500 MG PO TABS
500.0000 mg | ORAL_TABLET | Freq: Two times a day (BID) | ORAL | Status: DC
  Administered 2012-03-23 (×2): 500 mg via ORAL
  Filled 2012-03-22 (×5): qty 1

## 2012-03-22 MED ORDER — POTASSIUM CHLORIDE CRYS ER 20 MEQ PO TBCR
20.0000 meq | EXTENDED_RELEASE_TABLET | Freq: Two times a day (BID) | ORAL | Status: DC
  Administered 2012-03-22 – 2012-03-23 (×2): 20 meq via ORAL
  Filled 2012-03-22 (×3): qty 1

## 2012-03-22 MED ORDER — DEXTROSE 5 % IV SOLN
500.0000 mg | Freq: Every day | INTRAVENOUS | Status: DC
  Administered 2012-03-23: 500 mg via INTRAVENOUS
  Filled 2012-03-22 (×3): qty 500

## 2012-03-22 NOTE — Progress Notes (Addendum)
[facility] [place] TEAM 1 - Stepdown/ICU TEAM  PCP:  Jay Schlichter, MD, MD  Subjective: 78 y/o admitted 5/9 with multiple episodes of syncope and found to have Pneumonia, anemia and was stool occult postitive. She never noticed any blood loss. She was taking ASA daily and occasional Ibuprofen.  Underwent EGD and colonoscopy on 5/10 and was found to have multiple ulcers - suspected to be due to NSAID use. Large ulcer in stomach had a visible vessel which was not bleeding. 4 polyps removed. The evening of the endoscopies, developed bloody stools and went back for colonoscopy on 5/11. Noted to have large amount of blood and clot in colon and is suspected to have bled from polypectomy sites.   Today she is in good spirits.  She is resting comfortably.  She denies f/c, sob, n/v, or abdom pain.  She denies any further noticed bloody or black stools.    Objective:  Intake/Output Summary (Last 24 hours) at 03/22/12 1422 Last data filed at 03/22/12 1400  Gross per 24 hour  Intake    740 ml  Output   1800 ml  Net  -1060 ml   Blood pressure 139/64, pulse 68, temperature 97.8 F (36.6 C), temperature source Oral, resp. rate 20, height 5\' 1"  (1.549 m), weight 47 kg (103 lb 9.9 oz), SpO2 96.00%.  Physical Exam: General: No acute respiratory distress Lungs: Clear to auscultation bilaterally without wheezes or crackles Cardiovascular: Regular rate and rhythm without murmur gallop or rub normal S1 and S2 Abdomen: Nontender, nondistended, soft, bowel sounds positive, no rebound, no ascites, no appreciable mass Extremities: No significant cyanosis, clubbing, or edema bilateral lower extremities  Lab Results:  Basename 03/22/12 0915 03/21/12 1520 03/21/12 1000 03/21/12 0555  NA 137 139 -- 142  K 3.5 3.9 -- 2.9*  CL 105 109 -- 111  CO2 23 21 -- 21  GLUCOSE 144* 83 -- 82  BUN 4* 6 -- 5*  CREATININE 0.60 0.60 -- 0.62  CALCIUM 7.9* 7.1* -- 6.6*  MG -- -- 1.2* --  PHOS -- -- -- --     Basename 03/22/12 0552 03/21/12 1520 03/21/12 0555 03/20/12 0938  WBC 10.4 -- 10.0 10.4  NEUTROABS -- -- -- --  HGB 11.4* 12.1 11.5* --  HCT 33.0* 34.1* 31.6* --  MCV 87.3 -- 84.5 89.5  PLT 256 -- 183 274   Micro Results: Recent Results (from the past 240 hour(s))  CULTURE, BLOOD (ROUTINE X 2)     Status: Normal (Preliminary result)   Collection Time   03/17/12 11:46 PM      Component Value Range Status Comment   Specimen Description BLOOD RIGHT ARM   Final    Special Requests BOTTLES DRAWN AEROBIC AND ANAEROBIC 5CC   Final    Culture  Setup Time [id number]   Final    Culture     Final    Value:        BLOOD CULTURE RECEIVED NO GROWTH TO DATE CULTURE WILL BE HELD FOR 5 DAYS BEFORE ISSUING A FINAL NEGATIVE REPORT   Report Status PENDING   Incomplete   CULTURE, BLOOD (ROUTINE X 2)     Status: Normal (Preliminary result)   Collection Time   03/17/12 11:51 PM      Component Value Range Status Comment   Specimen Description BLOOD RIGHT ARM   Final    Special Requests BOTTLES DRAWN AEROBIC AND ANAEROBIC 5CC   Final    Culture  Setup Time [id number]  Final    Culture     Final    Value:        BLOOD CULTURE RECEIVED NO GROWTH TO DATE CULTURE WILL BE HELD FOR 5 DAYS BEFORE ISSUING A FINAL NEGATIVE REPORT   Report Status PENDING   Incomplete   URINE CULTURE     Status: Normal   Collection Time   03/18/12 12:10 AM      Component Value Range Status Comment   Specimen Description URINE, CATHETERIZED   Final    Special Requests NONE   Final    Culture  Setup Time [id number]   Final    Colony Count NO GROWTH   Final    Culture NO GROWTH   Final    Report Status 03/19/2012 FINAL   Final   MRSA PCR SCREENING     Status: Normal   Collection Time   03/18/12  6:18 AM      Component Value Range Status Comment   MRSA by PCR NEGATIVE  NEGATIVE  Final   URINE CULTURE     Status: Normal   Collection Time   03/18/12  4:49 PM      Component Value Range Status Comment   Specimen Description  URINE, CATHETERIZED   Final    Special Requests NONE   Final    Culture  Setup Time [id number]   Final    Colony Count NO GROWTH   Final    Culture NO GROWTH   Final    Report Status 03/19/2012 FINAL   Final     Studies/Results: All recent x-ray/radiology reports have been reviewed in detail.   Medications: I have reviewed the patient's complete medication list.  Assessment/Plan:  GI bleed  - initial bleeding spell due to multiple ulcers from NSAID use  - recurrent bleed was felt to be polypectomy related  - continue with PPI - diet per GI - clears today  - avoid all nsaids and ASA for now  -  f/u h. Pylori serology   Anemia due to acute blood loss  -Significant drop in Hb  -4 units of PRBC total transfused - Hgb appears to be stabilizing today - will need to cont to follow   Pneumonia  D/c'd rocephin - cont zithromax x 5 days (started 5/9) - She is significantly improved -  WBC normalized - f/u cxr in AM to assure "infiltrate" resolved   HTN  Hypotensive and bradycardic in ER - HTN has returned w/ transfusion/volume expansion - resumed meds  - will d/c HCTZ in this elderly pt who will be prone to [facility]   Near syncope  Due to dehydration/ hypotension and acute blood loss   Hypokalemia  Replacing  Hypomagnesemia  Replacing  Colon polyps  4 polyps removed - TUBULAR ADENOMAS noted on path results w/ no malignant cells noted   H/o colon CA  Her colectomy for colon cancer was 16 yrs ago in [place] - did not need adjuvant therapy as nodes were negative.   Disposition Stable for transfer to floor - will need PT/OT, and advancement of diet - d/c to depend upon progress w/ therapy, stability of Hgb, and tolerance of diet (likely 48+ hrs)  Lonia Blood, MD [facility] Office  [phone] Pager [phone]  On-Call/Text Page:      [email or web address]      password [facility]

## 2012-03-22 NOTE — Progress Notes (Signed)
Mary [name] Daily Rounding Note 03/22/2012, 8:28 AM  SUBJECTIVE:       Feels well.  Small stools, documented as brown by RN.  Not dizzy.  No belly pain  OBJECTIVE:        General: looks well.       Vital signs in last 24 hours:    Temp:  [97.9 F (36.6 C)-98.7 F (37.1 C)] 97.9 F (36.6 C) (05/13 0800) Pulse Rate:  [58-78] 63  (05/13 0700) Resp:  [17-24] 18  (05/13 0700) BP: (93-158)/(41-78) 138/49 mmHg (05/13 0700) SpO2:  [94 %-98 %] 95 % (05/13 0700) Weight:  [103 lb 9.9 oz (47 kg)] 103 lb 9.9 oz (47 kg) (05/13 0600) Last BM Date: 03/21/12  Heart: RRR Chest: clear B.  No cough or SOB Abdomen: NT, ND, active BS.  Extremities: no pedal edema Neuro/Psych:  Pleasant, relaxed.  No confusion.  Intake/Output from previous day: 05/12 0701 - 05/13 0700 In: 770 [P.O.:60; IV Piggyback:710] Out: 1700 [Urine:1700]  Intake/Output this shift:    Lab Results:  Basename 03/22/12 0552 03/21/12 1520 03/21/12 0555 03/20/12 0938  WBC 10.4 -- 10.0 10.4  HGB 11.4* 12.1 11.5* --  HCT 33.0* 34.1* 31.6* --  PLT 256 -- 183 274   BMET  Basename 03/21/12 1520 03/21/12 0555 03/20/12 0938  NA 139 142 144  K 3.9 2.9* 3.9  CL 109 111 116*  CO2 21 21 20   GLUCOSE 83 82 119*  BUN 6 5* 9  CREATININE 0.60 0.62 0.66  CALCIUM 7.1* 6.6* 7.3*   ASSESMENT: 1. Normocytic anemia, symptomatic with weakness. With [name] bleeding from gastric ulcers and then post-polypectomy bleeding causing acute blood loss anemia EGD #1 5/10:  Gastric cardia ulcers, one with VV, treated with Epi and Bicap.  Schatzki's ring, tortuous esophagus, HH.  Ulcers (from NSAIDs ?) were source of the initial anemia and FOB + stool EGD#2   5/11:  Ulcers again seen, not bleeding Colonoscopy #1 5/10:  Left side tics, cold bx polyps in cecum and asc colon, scattered ulcer of transverse and desc colon (? from NSAID). No recurrence of colon CA. Colonoscopy #2 5/11: For post procedure bleeding.  Cloots/blood in colon, non-bleeding  polypectomy at ascending colon was clipped, cecum sites obscured by blood clot and stool. H & H stable.  Not iron deficient.  S/p initial 2 unit PRBC transfusion.  Transfused 3 more units 2 days ago for post polypectomy bleed.  2. Pneumonia. WBCs improved. On Zithromax.  3. Right carotid bruit.  4.  Plavix listed as PTA med. This is not listed as active med by Dr Reuel Boom in Jan 2013, nor by [facility] pharmacy. So do not think she takes this. Indication not apparent. (no cva, TIA, A fib, stent.. In her hx) if she ever did take this, though she does have carotid bruit 5.  Hypocalcemia.  Albumin 2.3.   PLAN: 1.  Allow solid food.  2.  CBC in AM 3.  Change to po protonix.  4.  Ok to leave ICU for regular bed.    LOS: 5 days   Jennye Moccasin  03/22/2012, 8:28 AM Pager: [phone]   [name] Attending  Patient seen and interviewed. I agree with the above note.  It does not seem that she was on Plavix, at any rate would not use now.  Does not look like H. Pylori antibody was sent so i will check and do so if not.  ? Home in 2-3 days  depending upon clinical course.  Will need someone to check Hgb and see her within 1 week (PCP hopefully) and then see Dr. Rhea Belton in 4 weeks.  Iva Boop, MD, [facility] [phone] (pager) 03/22/2012 10:20 AM

## 2012-03-23 ENCOUNTER — Inpatient Hospital Stay (HOSPITAL_COMMUNITY): Payer: Medicare Other

## 2012-03-23 ENCOUNTER — Encounter: Payer: Self-pay | Admitting: Internal Medicine

## 2012-03-23 ENCOUNTER — Encounter (HOSPITAL_COMMUNITY): Payer: Self-pay | Admitting: Internal Medicine

## 2012-03-23 DIAGNOSIS — E871 Hypo-osmolality and hyponatremia: Secondary | ICD-10-CM

## 2012-03-23 DIAGNOSIS — K2901 Acute gastritis with bleeding: Secondary | ICD-10-CM

## 2012-03-23 DIAGNOSIS — D696 Thrombocytopenia, unspecified: Secondary | ICD-10-CM

## 2012-03-23 DIAGNOSIS — I10 Essential (primary) hypertension: Secondary | ICD-10-CM

## 2012-03-23 LAB — CBC
MCV: 88.1 fL (ref 78.0–100.0)
Platelets: 305 10*3/uL (ref 150–400)
RBC: 4.03 MIL/uL (ref 3.87–5.11)
RDW: 16.4 % — ABNORMAL HIGH (ref 11.5–15.5)
WBC: 11.8 10*3/uL — ABNORMAL HIGH (ref 4.0–10.5)

## 2012-03-23 LAB — BASIC METABOLIC PANEL
Chloride: 108 mEq/L (ref 96–112)
GFR calc Af Amer: 87 mL/min — ABNORMAL LOW (ref >90)
GFR calc non Af Amer: 75 mL/min — ABNORMAL LOW (ref >90)
Potassium: 3.7 mEq/L (ref 3.5–5.1)
Sodium: 141 mEq/L (ref 135–145)

## 2012-03-23 MED ORDER — PANTOPRAZOLE SODIUM 40 MG PO TBEC: 40.0000 mg | DELAYED_RELEASE_TABLET | Freq: Two times a day (BID) | ORAL | Status: DC

## 2012-03-23 MED ORDER — AZITHROMYCIN 250 MG PO TABS: ORAL_TABLET | ORAL | Status: AC

## 2012-03-23 NOTE — Discharge Summary (Signed)
[facility]                                                                                   Mary [name], 78 y.o., is a 78 y.o. female  DOB [birth year]  MRN [id number].  Admission date:  03/17/2012  Discharge Date:  03/23/2012  Primary MD  Jay Schlichter, MD, MD  Admitting Physician  Eduard Clos, MD  Admission Diagnosis  CODE STEMI  anemia, hx colon cancer gi bleeding, known ulcers, recent polypectomy GI bleed  Discharge Diagnosis     Principal Problem:  *GI bleed Active Problems:  Anemia  Pneumonia  HTN (hypertension)  H/O colon cancer, stage I  Near syncope  Gastric ulcer, acute  Colon polyps  Colon ulcer  Post-polypectomy bleeding  Acute posthemorrhagic anemia    Past Medical History  Diagnosis Date  . Hypertension   . Osteoporosis   . Pneumonia   . Anemia   . Arthritis   . Cancer     colon cancer    Past Surgical History  Procedure Date  . Cholecystectomy   . Colon surgery     colon cancer  . Tubal ligation   . Colonoscopy 03/19/2012    Procedure: COLONOSCOPY;  Surgeon: Beverley Fiedler, MD;  Location: [facility] ENDOSCOPY;  Service: Gastroenterology;  Laterality: N/A;  . Esophagogastroduodenoscopy 03/19/2012    Procedure: ESOPHAGOGASTRODUODENOSCOPY (EGD);  Surgeon: Beverley Fiedler, MD;  Location: [facility] ENDOSCOPY;  Service: Gastroenterology;  Laterality: N/A;     Hospital Course See H&P, Labs, Consult and Test reports for all details in brief-   78 y/o admitted 5/9 with multiple episodes of syncope and found to have Pneumonia, anemia and was stool occult postitive. She never noticed any blood loss. She was taking ASA daily and occasional Ibuprofen. Underwent EGD and colonoscopy on 5/10 and was found to have multiple ulcers - suspected to be due to NSAID use. Large ulcer in stomach had a visible vessel which was not bleeding. 4 polyps removed. The evening of the endoscopies, developed bloody stools  and went back for colonoscopy on 5/11. Noted to have large amount of blood and clot in colon and is suspected to have bled from polypectomy sites. He feels fine has no subjective complaints no more bleeding per rectum, H&H is stable. Discussed the case with GI patient is okay to be discharged with outpatient GI followup. Her aspirin and Plavix will be held till [facility] by GI to resume.  Also had evidence of left lower lobe cavity or pneumonia x-rays improved, she still has few rales in the left lower lobe, will finish Zithromax treatment for more days, repeat chest x-ray in 7-10 days to document stability. Patient is not coughing not short of breath not requiring any oxygen.  Anemia due to acute blood loss from GI tract combination of upper GI bleed, being on aspirin Plavix, polypectomy lower GI bleed - status post for packed RBC transfusions, now H&H and bleeding stable clinically. Aspirin Plavix held patient will be sent home on by mouth PPI with outpatient GI followup, and Plavix to be started by PCP in consultation with GI as clinically appropriate. CBC and BMP in 3  days.   Near syncope was due to dehydration and hypotension from blood loss now resolved.  Hypertension now we'll resume home medication, we'll request PCP to kindly monitor patients blood pressure trend closely, a chest blood pressure medication/diuretic dose as clinically appropriate.   History of colon cancer and colonic polyps. 4 polyps removed this admission. Patient to follow with GI closely as outpatient.   Patient is eager to go home does not want PT OT or homicidal home.    Things to follow for primary care physician CBC BMP in 3 days, chest x-ray two-view 7-10 days, blood pressure monitor and adjust blood pressure medications as needed. Patient will need close GI followup. Resume aspirin Plavix once it is okay by GI.    Consults GI  Significant Tests:  See full reports for all details     Dg Chest 2  View  03/18/2012  *RADIOLOGY REPORT*  Clinical Data: Syncope; nausea.  CHEST - 2 VIEW  Comparison: None.  Findings: The lungs are well-aerated.  Left midlung airspace opacity raises concern for pneumonia.  There is no evidence of pleural effusion or pneumothorax.  The heart is normal in size; the mediastinal contour is within normal limits. Moderate hiatal hernia is noted, with an air-fluid level.  No acute osseous abnormalities are seen.  There is slight loss of vertebral body height along the lower thoracic spine.  IMPRESSION:  1.  Left midlung airspace opacity raises concern for pneumonia.  If the patient has symptoms of pneumonia, would treat for pneumonia and then perform follow-up chest radiograph to ensure resolution of airspace opacity. 2.  Moderate hiatal hernia noted, with an air-fluid level.  Original Report Authenticated By: Tonia Ghent, M.D.   Ct Head Wo Contrast  03/18/2012  *RADIOLOGY REPORT*  Clinical Data: Nausea and flushing; syncope.  Hypotension.  CT HEAD WITHOUT CONTRAST  Technique:  Contiguous axial images were obtained from the base of the skull through the vertex without contrast.  Comparison: None.  Findings: There is no evidence of acute infarction, mass lesion, or intra- or extra-axial hemorrhage on CT.  Prominence of the ventricles and sulci reflects mild cortical volume loss.  Mild scattered periventricular and white matter change likely reflects small vessel ischemic microangiopathy.  The posterior fossa, including the cerebellum, brainstem and fourth ventricle, is within normal limits.  The basal ganglia are unremarkable in appearance.  The cerebral hemispheres demonstrate grossly normal gray-white differentiation.  No mass effect or midline shift is seen.  There is no evidence of fracture; visualized osseous structures are unremarkable in appearance.  The orbits are within normal limits. The paranasal sinuses and mastoid air cells are well-aerated.  No significant soft tissue  abnormalities are seen.  IMPRESSION:  1.  No acute intracranial pathology seen on CT. 2.  Mild cortical volume loss and scattered small vessel ischemic microangiopathy.  Original Report Authenticated By: Tonia Ghent, M.D.   Dg Chest Port 1 View  03/23/2012  *RADIOLOGY REPORT*  Clinical Data: Chest soreness.  Infiltrate.  PORTABLE CHEST - 1 VIEW  Comparison: 03/18/2012.  Findings: Borderline heart size for projection.  Tortuous thoracic aorta.  Mild basilar atelectasis.  No focal consolidation is identified.  The density in the left midlung appears improved, which may represent improving perihilar atelectasis or improving pneumonia. Favor improving infection based on the time course from 03/18/2012. Underlying changes of emphysema.  IMPRESSION:  1.  Improving left perihilar opacity likely representing treated pneumonia. 2.  Emphysema. 3.  Borderline heart size for projection.  Original Report Authenticated By: Andreas Newport, M.D.     Today   Subjective:   Mary [name] today has no headache,no chest abdominal pain,no new weakness tingling or numbness, feels much better wants to go home today.    Objective:   Blood pressure 139/61, pulse 69, temperature 97.4 F (36.3 C), temperature source Oral, resp. rate 20, height 5\' 1"  (1.549 m), weight 47 kg (103 lb 9.9 oz), SpO2 96.00%.  Intake/Output Summary (Last 24 hours) at 03/23/12 1000 Last data filed at 03/22/12 1700  Gross per 24 hour  Intake    120 ml  Output    601 ml  Net   -481 ml    Exam Awake Alert, Oriented *3, No new F.N deficits, Normal affect [facility].AT,PERRAL Supple Neck,No JVD, No cervical lymphadenopathy appriciated.  Symmetrical Chest wall movement, Good air movement bilaterally, CTAB RRR,No Gallops,Rubs or new Murmurs, No Parasternal Heave +ve B.Sounds, Abd Soft, Non tender, No organomegaly appriciated, No rebound -guarding or rigidity. No Cyanosis, Clubbing or edema, No new Rash or bruise  Data Review      CBC w  Diff: Lab Results  Component Value Date   WBC 11.8* 03/23/2012   HGB 12.2 03/23/2012   HCT 35.5* 03/23/2012   PLT 305 03/23/2012   CMP: Lab Results  Component Value Date   NA 141 03/23/2012   K 3.7 03/23/2012   CL 108 03/23/2012   CO2 26 03/23/2012   BUN 7 03/23/2012   CREATININE 0.73 03/23/2012   PROT 5.0* 03/18/2012   ALBUMIN 2.3* 03/18/2012   BILITOT 0.3 03/18/2012   ALKPHOS 38* 03/18/2012   AST 15 03/18/2012   ALT 10 03/18/2012  .  Micro Results Recent Results (from the past 240 hour(s))  CULTURE, BLOOD (ROUTINE X 2)     Status: Normal (Preliminary result)   Collection Time   03/17/12 11:46 PM      Component Value Range Status Comment   Specimen Description BLOOD RIGHT ARM   Final    Special Requests BOTTLES DRAWN AEROBIC AND ANAEROBIC 5CC   Final    Culture  Setup Time [id number]   Final    Culture     Final    Value:        BLOOD CULTURE RECEIVED NO GROWTH TO DATE CULTURE WILL BE HELD FOR 5 DAYS BEFORE ISSUING A FINAL NEGATIVE REPORT   Report Status PENDING   Incomplete   CULTURE, BLOOD (ROUTINE X 2)     Status: Normal (Preliminary result)   Collection Time   03/17/12 11:51 PM      Component Value Range Status Comment   Specimen Description BLOOD RIGHT ARM   Final    Special Requests BOTTLES DRAWN AEROBIC AND ANAEROBIC 5CC   Final    Culture  Setup Time [id number]   Final    Culture     Final    Value:        BLOOD CULTURE RECEIVED NO GROWTH TO DATE CULTURE WILL BE HELD FOR 5 DAYS BEFORE ISSUING A FINAL NEGATIVE REPORT   Report Status PENDING   Incomplete   URINE CULTURE     Status: Normal   Collection Time   03/18/12 12:10 AM      Component Value Range Status Comment   Specimen Description URINE, CATHETERIZED   Final    Special Requests NONE   Final    Culture  Setup Time [id number]   Final    Colony Count NO GROWTH   Final  Culture NO GROWTH   Final    Report Status 03/19/2012 FINAL   Final   MRSA PCR SCREENING     Status: Normal   Collection Time   03/18/12  6:18 AM       Component Value Range Status Comment   MRSA by PCR NEGATIVE  NEGATIVE  Final   URINE CULTURE     Status: Normal   Collection Time   03/18/12  4:49 PM      Component Value Range Status Comment   Specimen Description URINE, CATHETERIZED   Final    Special Requests NONE   Final    Culture  Setup Time [id number]   Final    Colony Count NO GROWTH   Final    Culture NO GROWTH   Final    Report Status 03/19/2012 FINAL   Final      Discharge Instructions     Follow with Primary MD Jay Schlichter, MD, MD in 3 days   Get CBC, CMP, checked 3 days by Primary MD and again as instructed by your Primary MD. Get a 2 view Chest X ray done next visit.  Get Medicines reviewed and adjusted.  Please request your Prim.MD to go over all Hospital Tests and Procedure/Radiological results at the follow up, please get all Hospital records sent to your Prim MD by signing hospital release before you go home.  Activity: As tolerated with Full fall precautions use walker/cane & assistance as needed  Diet: Heart Healthy, Aspiration precautions.  For Heart failure patients - Check your Weight same time everyday, if you gain over 2 pounds, or you develop in leg swelling, experience more shortness of breath or chest pain, call your Primary MD immediately. Follow Cardiac Low Salt Diet and 1.8 lit/day fluid restriction.  Disposition Home  If you experience worsening of your admission symptoms, develop shortness of breath, life threatening emergency, suicidal or homicidal thoughts you must seek medical attention immediately by calling 911 or calling your MD immediately  if symptoms less severe.  You Must read complete instructions/literature along with all the possible adverse reactions/side effects for all the Medicines you take and that have been prescribed to you. Take any new Medicines after you have completely understood and accpet all the possible adverse reactions/side effects.   Do not drive if your were  admitted for syncope or siezures until you have seen by Primary MD or a Neurologist and advised to drive.  Do not drive when taking Pain medications.    Do not take more than prescribed Pain, Sleep and Anxiety Medications  Special Instructions: If you have smoked or chewed Tobacco  in the last 2 yrs please stop smoking, stop any regular Alcohol  and or any Recreational drug use.  Wear Seat belts while driving.    Follow-up Information    Follow up with Beverley Fiedler, MD on 04/19/2012. (10:30 AM.  to follow up ulcers. )    Contact information:   [address] [place] [address] [phone]       Follow up with Jay Schlichter, MD. Schedule an appointment as soon as possible for a visit in 3 days.         Discharge Medications   Medication List  As of 03/23/2012 10:00 AM   START taking these medications         azithromycin 250 MG tablet   Commonly known as: ZITHROMAX   4 day supply      pantoprazole 40 MG tablet  Commonly known as: PROTONIX   Take 1 tablet (40 mg total) by mouth 2 (two) times daily before a meal.         CONTINUE taking these medications         CALCIUM + D PO      diltiazem 240 MG 24 hr capsule   Commonly known as: DILACOR XR      docusate sodium 100 MG capsule   Commonly known as: COLACE      heparin flush 10 UNIT/ML injection      losartan-hydrochlorothiazide 100-25 MG per tablet   Commonly known as: HYZAAR      Red Yeast Rice 600 MG Tabs         STOP taking these medications         aspirin EC 81 MG tablet      clopidogrel 300 MG Tabs          Where to get your medications    These are the prescriptions that you need to pick up. We sent them to a specific pharmacy, so you will need to go there to get them.   [facility] - [place], [place] - [address]    [address] [place] [address]    Phone: [phone]        azithromycin 250 MG tablet   pantoprazole 40 MG tablet              Total Time in preparing paper work, data evaluation and todays exam - 35 minutes  Leroy Sea M.D on 03/23/2012 at 10:00 AM  [facility] Office  [phone]

## 2012-03-23 NOTE — Progress Notes (Signed)
     [facility] Daily Rounding Note 03/23/2012, 9:18 AM  SUBJECTIVE:       Regular diet, ate 100%.  Stools light brown.  Strength and endurance improving per PT progress notes.   OBJECTIVE:        General: looks well     Vital signs in last 24 hours:    Temp:  [97.4 F (36.3 C)-97.8 F (36.6 C)] 97.4 F (36.3 C) (05/14 0556) Pulse Rate:  [68-77] 69  (05/14 0556) Resp:  [18-21] 20  (05/14 0556) BP: (131-159)/(57-87) 139/61 mmHg (05/14 0556) SpO2:  [96 %-98 %] 96 % (05/14 0556) Last BM Date: 03/22/12  Heart: RRR Chest: clear B, slight ly diminished BS on left Abdomen: soft, NT, ND.  Active BS  Extremities: no pedal  edema Neuro/Psych:  Pleasant.  No confusion or anxiety.   Intake/Output from previous day: 05/13 0701 - 05/14 0700 In: 420 [P.O.:420] Out: 901 [Urine:900; Stool:1]  Intake/Output this shift:    Lab Results:  Basename 03/23/12 0638 03/22/12 0552 03/21/12 1520 03/21/12 0555  WBC 11.8* 10.4 -- 10.0  HGB 12.2 11.4* 12.1 --  HCT 35.5* 33.0* 34.1* --  PLT 305 256 -- 183   BMET  Basename 03/23/12 0638 03/22/12 0915 03/21/12 1520  NA 141 137 139  K 3.7 3.5 3.9  CL 108 105 109  CO2 26 23 21   GLUCOSE 103* 144* 83  BUN 7 4* 6  CREATININE 0.73 0.60 0.60  CALCIUM 8.4 7.9* 7.1*   Studies/Results: Dg Chest Port 1 View 03/23/2012  *RADIOLOGY REPORT*  Clinical Data: Chest soreness.  Infiltrate.  PORTABLE CHEST - 1 VIEW  Comparison: 03/18/2012.  Findings: Borderline heart size for projection.  Tortuous thoracic aorta.  Mild basilar atelectasis.  No focal consolidation is identified.  The density in the left midlung appears improved, which may represent improving perihilar atelectasis or improving pneumonia. Favor improving infection based on the time course from 03/18/2012. Underlying changes of emphysema.  IMPRESSION:  1.  Improving left perihilar opacity likely representing treated pneumonia. 2.  Emphysema. 3.  Borderline heart size for projection.  Original Report  Authenticated By: Andreas Newport, M.D.    ASSESMENT: 1. Normocytic anemia, symptomatic with weakness. With GI bleeding from gastric ulcers and then post-polypectomy bleeding causing acute blood loss anemia  EGD #1 5/10: Gastric cardia ulcers, one with VV, treated with Epi and Bicap. Schatzki's ring, tortuous esophagus, HH. Ulcers (from NSAIDs ?) were source of the initial anemia and FOB + stool  EGD#2 5/11: Ulcers again seen, not bleeding  Colonoscopy #1 5/10: Left side tics, cold bx polyps in cecum and asc colon, scattered ulcer of transverse and desc colon (? from NSAID). No recurrence of colon CA.  Colonoscopy #2 5/11: For post procedure bleeding. Cloots/blood in colon, non-bleeding polypectomy at ascending colon was clipped, cecum sites obscured by blood clot and stool.  H & H steadily improving. Not iron deficient.  S/p initial 2 unit PRBC transfusion. Transfused 3 more units 2 days ago for post polypectomy bleed.  2. Pneumonia. CXR and WBCs improved. On Zithromax. No resp distress.  3. Right carotid bruit.    PLAN: *  Has ROV with Dr Rhea Belton on 6/10, entered into D/C plans.  *  BID protonix for 2 weeks, then change to daily.  *  OK to discharge today.    LOS: 6 days   Mary [name]  03/23/2012, 9:18 AM Pager: [phone]

## 2012-03-23 NOTE — Progress Notes (Signed)
HOME HEALTH AGENCIES SERVING [place]   Agencies that are Medicare-Certified and are affiliated with [facility]  Telephone Number Address  [facility]   The [facility] has ownership interest in this company; however, you are under no obligation to use this agency. [phone] or  [phone] [address] [place], [place] [address]   Agencies that are Medicare-Certified and are not affiliated with [facility] Telephone Number Address  [facility] [phone] Fax [phone] [address] [place], [place]  [address]  [facility] [phone] or [phone] Fax [phone] [address] [place], [place] [address]  [facility] [phone] Fax [phone] [address] [place], [place] [address]  [facility] [phone] Fax [phone] [address] [place], [place]  [address]  [facility] [phone] Fax [phone] [address], Suite [place], [place] [address]  [facility] [phone] [address] [place], [place] [address]  [facility] [phone]  [address] [place], [place] [address]  [facility] [phone] or [phone] Fax [phone] [address] [place], [place]  [address]  [facility] [phone] Fax [phone] [address] [place], [place]  [address]  [facility]  [phone] Fax [phone] [address] [place], [place] [address]               Agencies that are not Medicare-Certified and are not affiliated with [facility] Agency Telephone Number Address  [facility] [place] [phone] or [phone] Fax [phone] [address] [place]  [address]  [facility] [phone] Fax [phone] [address] [place], [place]  [address]  [facility]  [phone] Fax [phone] [address] [place], [place] [address]  [facility] [place] [facility] [phone] Fax [phone] [address] [place], [place] [address]  [facility] [phone] or [phone] Fax [phone] [address] [place], [place]  [address]  [facility] [phone] or [phone] Fax [phone] [address] [place] [address], [address] [place], [place]  [address]  [facility] [phone] Fax [phone] [address] [place], [place]  [address]  [facility] [phone] [address] [place], [place]  [address]  [facility] [phone] Fax [phone] [address] [place], [place]  [address]  [facility] [phone] Fax [phone] [address] [place], [place]  [address]  [facility] [phone] Fax [phone] [address] [place], [place] [address]  [facility] [phone] Fax [phone] [address] [place], [place]  [address]  In to see patient to offer choice for home health agencies based on recommendations. Patient refused home health services. RN  notified.

## 2012-03-23 NOTE — Discharge Instructions (Signed)
Follow with Primary MD Jay Schlichter, MD, MD in 3 days   Get CBC, CMP, checked 3 days by Primary MD and again as instructed by your Primary MD. Get a 2 view Chest X ray done next visit.  Get Medicines reviewed and adjusted.  Please request your Prim.MD to go over all Hospital Tests and Procedure/Radiological results at the follow up, please get all Hospital records sent to your Prim MD by signing hospital release before you go home.  Activity: As tolerated with Full fall precautions use walker/cane & assistance as needed  Diet: Heart Healthy, Aspiration precautions.  For Heart failure patients - Check your Weight same time everyday, if you gain over 2 pounds, or you develop in leg swelling, experience more shortness of breath or chest pain, call your Primary MD immediately. Follow Cardiac Low Salt Diet and 1.8 lit/day fluid restriction.  Disposition Home  If you experience worsening of your admission symptoms, develop shortness of breath, life threatening emergency, suicidal or homicidal thoughts you must seek medical attention immediately by calling 911 or calling your MD immediately  if symptoms less severe.  You Must read complete instructions/literature along with all the possible adverse reactions/side effects for all the Medicines you take and that have been prescribed to you. Take any new Medicines after you have completely understood and accpet all the possible adverse reactions/side effects.   Do not drive if your were admitted for syncope or siezures until you have seen by Primary MD or a Neurologist and advised to drive.  Do not drive when taking Pain medications.    Do not take more than prescribed Pain, Sleep and Anxiety Medications  Special Instructions: If you have smoked or chewed Tobacco  in the last 2 yrs please stop smoking, stop any regular Alcohol  and or any Recreational drug use.  Wear Seat belts while driving.

## 2012-03-23 NOTE — Progress Notes (Signed)
Physical Therapy Treatment Patient Details Name: Mary [name] MRN: [id number] DOB: 1923-03-24 Today's Date: 03/23/2012 Time: 4098-1191 PT Time Calculation (min): 13 min  PT Assessment / Plan / Recommendation Comments on Treatment Session  Doing much better than on eval. Pt will likely need no PT follow up but will continue to follow while in acute as pt has developed some generalized weakness causing her to fatigue quicker than her baseline. Slight higher level balance deficits during gait but encouraged pt to ambulate multiple times daily with nursing to improve her activity tolerance.     Follow Up Recommendations  No PT follow up    Barriers to Discharge        Equipment Recommendations  None recommended by PT    Recommendations for Other Services    Frequency Min 3X/week   Plan Discharge plan remains appropriate;Frequency remains appropriate    Precautions / Restrictions Precautions Precautions: Fall       Mobility  Bed Mobility Bed Mobility: Sit to Supine Sit to Supine: 6: Modified independent (Device/Increase time) Details for Bed Mobility Assistance: pt sitting EOB on my arrival eating her breakfast Transfers Transfers: Sit to Stand;Stand to Sit Sit to Stand: 6: Modified independent (Device/Increase time);With upper extremity assist;From bed Stand to Sit: 6: Modified independent (Device/Increase time);With upper extremity assist;To bed Ambulation/Gait Ambulation/Gait Assistance: 5: Supervision Ambulation Distance (Feet): 400 Feet Assistive device: None Ambulation/Gait Assistance Details: amb with decreased gait speed from her norm (per pt report) and very focused/gaurded, staggers slightly with head turns; very forward head and increased kyphotic posture Gait Pattern: Step-through pattern;Trunk flexed Stairs: Yes Stairs Assistance: 4: Min guard Stairs Assistance Details (indicate cue type and reason): pt ascended stairs with alternating pattern; descended with  step to pattern; close gaurding because of patient fatigue Stair Management Technique: One rail Left;Forwards;Alternating pattern;Step to pattern Number of Stairs: 10     Exercises      PT Goals Acute Rehab PT Goals PT Goal: Sit to Stand - Progress: Partly met PT Goal: Stand to Sit - Progress: Partly met PT Transfer Goal: Bed to Chair/Chair to Bed - Progress: Progressing toward goal PT Goal: Ambulate - Progress: Progressing toward goal PT Goal: Up/Down Stairs - Progress: Progressing toward goal Additional Goals Additional Goal #1: Pt will demonstrate decreased risk of falls with DGI greater than or equal to 20/24.  PT Goal: Additional Goal #1 - Progress: Goal set today  Visit Information  Last PT Received On: 03/23/12 Assistance Needed: +1    Subjective Data  Subjective: I feel much better now.  Patient Stated Goal: home   Cognition  Overall Cognitive Status: Appears within functional limits for tasks assessed/performed Arousal/Alertness: Awake/alert Orientation Level: Appears intact for tasks assessed Behavior During Session: [facility] for tasks performed    Balance  Standardized Balance Assessment Standardized Balance Assessment: Dynamic Gait Index Dynamic Gait Index Level Surface: Mild Impairment Change in Gait Speed: Mild Impairment Gait with Horizontal Head Turns: Mild Impairment Gait with Vertical Head Turns: Mild Impairment Gait and Pivot Turn: Normal Step Over Obstacle: Mild Impairment Step Around Obstacles: Mild Impairment Steps: Mild Impairment Total Score: 17   End of Session PT - End of Session Equipment Utilized During Treatment: Gait belt Activity Tolerance: Patient tolerated treatment well Patient left: in bed;with call bell/phone within reach Nurse Communication: Mobility status    WHITLOW,Majel Giel HELEN 03/23/2012, 9:09 AM

## 2012-03-23 NOTE — Evaluation (Signed)
Occupational Therapy Evaluation and Discharge Patient Details Name: Mary [name] MRN: [id number] DOB: 1923-09-12 Today's Date: 03/23/2012 Time: 1478-2956 OT Time Calculation (min): 9 min  OT Assessment / Plan / Recommendation Clinical Impression  Pt at baseline functiong, demonstrating I with ambulation, LB dsg/bathing, and grooming tasks at sink. Pt with no further acute OT needs at this time.     OT Assessment  Patient does not need any further OT services    Follow Up Recommendations  No OT follow up    Barriers to Discharge      Equipment Recommendations  None recommended by OT    Recommendations for Other Services    Frequency       Precautions / Restrictions Precautions Precautions: Fall   Pertinent Vitals/Pain No c/o pain    ADL  Ambulation Related to ADLs: I with ambulation throughout room (no AD).  ADL Comments: Pt at baseline functioning with all simulated/demonstrated ADLs. Pt reports she sits down in bathtub at home, assured therapist she would make sure family around to ensure safety with transfer. Family memeber in room agreed.    OT Diagnosis:    OT Problem List:   OT Treatment Interventions:     OT Goals    Visit Information  Last OT Received On: 03/23/12 Assistance Needed: +1    Subjective Data  Subjective: I'm ready to go home. Patient Stated Goal: Return home   Prior Functioning  Home Living Lives With: Alone Available Help at Discharge: Family;Available 24 hours/day Type of Home: House Home Access: Stairs to enter [employer] of Steps: 3-4 Entrance Stairs-Rails: Can reach both Home Layout: Two level;Laundry or work area in basement [employer] Shower/Tub: Network engineer: None Prior Function Level of Independence: Independent Able to Take Stairs?: Reciprically Driving: Yes Vocation: Retired Comments: still very active, does all her yard work, reports that just recently  they asked her to stop going up on her roof Communication Communication: No difficulties Dominant Hand: Right    Cognition  Overall Cognitive Status: Appears within functional limits for tasks assessed/performed Arousal/Alertness: Awake/alert Orientation Level: Appears intact for tasks assessed Behavior During Session: [facility] for tasks performed    Extremity/Trunk Assessment Right Upper Extremity Assessment RUE ROM/Strength/Tone: Within functional levels RUE Sensation: WFL - Light Touch;WFL - Proprioception RUE Coordination: WFL - gross/fine motor Left Upper Extremity Assessment LUE ROM/Strength/Tone: Within functional levels LUE Sensation: WFL - Light Touch;WFL - Proprioception LUE Coordination: WFL - gross/fine motor   Mobility Bed Mobility Bed Mobility: Sit to Supine Supine to Sit: 7: Independent;HOB flat Sit to Supine: 7: Independent;HOB flat Details for Bed Mobility Assistance: pt sitting EOB on my arrival eating her breakfast Transfers Sit to Stand: 7: Independent;From bed Stand to Sit: 7: Independent;To bed   Exercise    Balance Standardized Balance Assessment Standardized Balance Assessment: Dynamic Gait Index Dynamic Gait Index Level Surface: Mild Impairment Change in Gait Speed: Mild Impairment Gait with Horizontal Head Turns: Mild Impairment Gait with Vertical Head Turns: Mild Impairment Gait and Pivot Turn: Normal Step Over Obstacle: Mild Impairment Step Around Obstacles: Mild Impairment Steps: Mild Impairment Total Score: 17   End of Session OT - End of Session Activity Tolerance: Patient tolerated treatment well Patient left: in bed;with family/visitor present;with call bell/phone within reach   Nija Koopman 03/23/2012, 11:12 AM

## 2012-03-24 LAB — CULTURE, BLOOD (ROUTINE X 2)
Culture  Setup Time: 201305090501
Culture: NO GROWTH

## 2012-03-24 LAB — H. PYLORI ANTIBODY, IGG: H Pylori IgG: 6.21 {ISR} — ABNORMAL HIGH

## 2012-03-25 NOTE — Progress Notes (Signed)
Agree with Ms. Gribbin's assessment and plan. Tyrin Herbers E. Sherral Dirocco, MD, FACG  

## 2012-03-26 ENCOUNTER — Telehealth: Payer: Self-pay | Admitting: *Deleted

## 2012-03-26 DIAGNOSIS — D649 Anemia, unspecified: Secondary | ICD-10-CM | POA: Diagnosis not present

## 2012-03-26 DIAGNOSIS — K279 Peptic ulcer, site unspecified, unspecified as acute or chronic, without hemorrhage or perforation: Secondary | ICD-10-CM | POA: Diagnosis not present

## 2012-03-26 DIAGNOSIS — M1990 Unspecified osteoarthritis, unspecified site: Secondary | ICD-10-CM | POA: Diagnosis not present

## 2012-03-26 NOTE — Telephone Encounter (Signed)
[place] Dec scheduled; pt aware.

## 2012-03-26 NOTE — Telephone Encounter (Signed)
Line busy

## 2012-03-30 NOTE — Progress Notes (Signed)
Quick Note:  Coming to Dr. Rhea Belton in follow-up  Wait to treat H pylori til then ______

## 2012-03-31 ENCOUNTER — Other Ambulatory Visit: Payer: Self-pay | Admitting: Internal Medicine

## 2012-03-31 MED ORDER — AMOXICILL-CLARITHRO-LANSOPRAZ PO MISC: Freq: Two times a day (BID) | ORAL | Status: DC

## 2012-04-01 ENCOUNTER — Telehealth: Payer: Self-pay | Admitting: Gastroenterology

## 2012-04-01 NOTE — Telephone Encounter (Signed)
Started prior auth that is needed for pt's Prevpac.  Should have a response within 24 hours.  Case # [id number]

## 2012-04-01 NOTE — Telephone Encounter (Signed)
Prior Auth for [facility] approved. Pt aware

## 2012-04-13 ENCOUNTER — Encounter: Payer: Self-pay | Admitting: Internal Medicine

## 2012-04-15 DIAGNOSIS — E782 Mixed hyperlipidemia: Secondary | ICD-10-CM | POA: Diagnosis not present

## 2012-04-15 DIAGNOSIS — R5383 Other fatigue: Secondary | ICD-10-CM | POA: Diagnosis not present

## 2012-04-15 DIAGNOSIS — I10 Essential (primary) hypertension: Secondary | ICD-10-CM | POA: Diagnosis not present

## 2012-04-19 ENCOUNTER — Ambulatory Visit: Payer: PRIVATE HEALTH INSURANCE | Admitting: Internal Medicine

## 2012-04-22 DIAGNOSIS — I10 Essential (primary) hypertension: Secondary | ICD-10-CM | POA: Diagnosis not present

## 2012-04-22 DIAGNOSIS — M1990 Unspecified osteoarthritis, unspecified site: Secondary | ICD-10-CM | POA: Diagnosis not present

## 2012-04-22 DIAGNOSIS — E782 Mixed hyperlipidemia: Secondary | ICD-10-CM | POA: Diagnosis not present

## 2012-04-22 DIAGNOSIS — M810 Age-related osteoporosis without current pathological fracture: Secondary | ICD-10-CM | POA: Diagnosis not present

## 2012-04-22 DIAGNOSIS — C189 Malignant neoplasm of colon, unspecified: Secondary | ICD-10-CM | POA: Diagnosis not present

## 2012-04-22 DIAGNOSIS — D649 Anemia, unspecified: Secondary | ICD-10-CM | POA: Diagnosis not present

## 2012-04-22 DIAGNOSIS — K279 Peptic ulcer, site unspecified, unspecified as acute or chronic, without hemorrhage or perforation: Secondary | ICD-10-CM | POA: Diagnosis not present

## 2012-05-03 DIAGNOSIS — L570 Actinic keratosis: Secondary | ICD-10-CM | POA: Diagnosis not present

## 2012-06-03 DIAGNOSIS — H02059 Trichiasis without entropian unspecified eye, unspecified eyelid: Secondary | ICD-10-CM | POA: Diagnosis not present

## 2012-06-03 DIAGNOSIS — Z961 Presence of intraocular lens: Secondary | ICD-10-CM | POA: Diagnosis not present

## 2012-06-03 DIAGNOSIS — H538 Other visual disturbances: Secondary | ICD-10-CM | POA: Diagnosis not present

## 2012-07-20 DIAGNOSIS — H02059 Trichiasis without entropian unspecified eye, unspecified eyelid: Secondary | ICD-10-CM | POA: Diagnosis not present

## 2012-07-29 DIAGNOSIS — Z23 Encounter for immunization: Secondary | ICD-10-CM | POA: Diagnosis not present

## 2012-08-17 DIAGNOSIS — H02059 Trichiasis without entropian unspecified eye, unspecified eyelid: Secondary | ICD-10-CM | POA: Diagnosis not present

## 2012-08-31 DIAGNOSIS — I10 Essential (primary) hypertension: Secondary | ICD-10-CM | POA: Diagnosis not present

## 2012-08-31 DIAGNOSIS — D649 Anemia, unspecified: Secondary | ICD-10-CM | POA: Diagnosis not present

## 2012-08-31 DIAGNOSIS — E782 Mixed hyperlipidemia: Secondary | ICD-10-CM | POA: Diagnosis not present

## 2012-09-07 DIAGNOSIS — D649 Anemia, unspecified: Secondary | ICD-10-CM | POA: Diagnosis not present

## 2012-09-07 DIAGNOSIS — M1990 Unspecified osteoarthritis, unspecified site: Secondary | ICD-10-CM | POA: Diagnosis not present

## 2012-09-07 DIAGNOSIS — K279 Peptic ulcer, site unspecified, unspecified as acute or chronic, without hemorrhage or perforation: Secondary | ICD-10-CM | POA: Diagnosis not present

## 2012-09-07 DIAGNOSIS — C189 Malignant neoplasm of colon, unspecified: Secondary | ICD-10-CM | POA: Diagnosis not present

## 2012-09-07 DIAGNOSIS — I10 Essential (primary) hypertension: Secondary | ICD-10-CM | POA: Diagnosis not present

## 2012-09-07 DIAGNOSIS — M810 Age-related osteoporosis without current pathological fracture: Secondary | ICD-10-CM | POA: Diagnosis not present

## 2012-09-07 DIAGNOSIS — E782 Mixed hyperlipidemia: Secondary | ICD-10-CM | POA: Diagnosis not present

## 2012-09-22 DIAGNOSIS — H26499 Other secondary cataract, unspecified eye: Secondary | ICD-10-CM | POA: Diagnosis not present

## 2012-10-06 DIAGNOSIS — H26499 Other secondary cataract, unspecified eye: Secondary | ICD-10-CM | POA: Diagnosis not present

## 2012-10-12 DIAGNOSIS — L820 Inflamed seborrheic keratosis: Secondary | ICD-10-CM | POA: Diagnosis not present

## 2012-10-28 DIAGNOSIS — H26499 Other secondary cataract, unspecified eye: Secondary | ICD-10-CM | POA: Diagnosis not present

## 2012-10-28 DIAGNOSIS — H40029 Open angle with borderline findings, high risk, unspecified eye: Secondary | ICD-10-CM | POA: Diagnosis not present

## 2012-11-12 DIAGNOSIS — H40029 Open angle with borderline findings, high risk, unspecified eye: Secondary | ICD-10-CM | POA: Diagnosis not present

## 2012-11-12 DIAGNOSIS — H04129 Dry eye syndrome of unspecified lacrimal gland: Secondary | ICD-10-CM | POA: Diagnosis not present

## 2012-11-12 DIAGNOSIS — H26499 Other secondary cataract, unspecified eye: Secondary | ICD-10-CM | POA: Diagnosis not present

## 2013-01-03 DIAGNOSIS — E782 Mixed hyperlipidemia: Secondary | ICD-10-CM | POA: Diagnosis not present

## 2013-01-03 DIAGNOSIS — I10 Essential (primary) hypertension: Secondary | ICD-10-CM | POA: Diagnosis not present

## 2013-01-07 DIAGNOSIS — M810 Age-related osteoporosis without current pathological fracture: Secondary | ICD-10-CM | POA: Diagnosis not present

## 2013-01-20 DIAGNOSIS — R5381 Other malaise: Secondary | ICD-10-CM | POA: Diagnosis not present

## 2013-02-01 DIAGNOSIS — R5381 Other malaise: Secondary | ICD-10-CM | POA: Diagnosis not present

## 2013-02-01 DIAGNOSIS — Z79899 Other long term (current) drug therapy: Secondary | ICD-10-CM | POA: Diagnosis not present

## 2013-02-01 DIAGNOSIS — R5383 Other fatigue: Secondary | ICD-10-CM | POA: Diagnosis not present

## 2013-02-01 DIAGNOSIS — I10 Essential (primary) hypertension: Secondary | ICD-10-CM | POA: Diagnosis not present

## 2013-05-09 DIAGNOSIS — E782 Mixed hyperlipidemia: Secondary | ICD-10-CM | POA: Diagnosis not present

## 2013-05-09 DIAGNOSIS — R5381 Other malaise: Secondary | ICD-10-CM | POA: Diagnosis not present

## 2013-05-09 DIAGNOSIS — I10 Essential (primary) hypertension: Secondary | ICD-10-CM | POA: Diagnosis not present

## 2013-05-16 DIAGNOSIS — K279 Peptic ulcer, site unspecified, unspecified as acute or chronic, without hemorrhage or perforation: Secondary | ICD-10-CM | POA: Diagnosis not present

## 2013-05-16 DIAGNOSIS — I10 Essential (primary) hypertension: Secondary | ICD-10-CM | POA: Diagnosis not present

## 2013-05-16 DIAGNOSIS — C189 Malignant neoplasm of colon, unspecified: Secondary | ICD-10-CM | POA: Diagnosis not present

## 2013-05-16 DIAGNOSIS — E782 Mixed hyperlipidemia: Secondary | ICD-10-CM | POA: Diagnosis not present

## 2013-05-16 DIAGNOSIS — M810 Age-related osteoporosis without current pathological fracture: Secondary | ICD-10-CM | POA: Diagnosis not present

## 2013-05-16 DIAGNOSIS — M1990 Unspecified osteoarthritis, unspecified site: Secondary | ICD-10-CM | POA: Diagnosis not present

## 2013-05-16 DIAGNOSIS — R011 Cardiac murmur, unspecified: Secondary | ICD-10-CM | POA: Diagnosis not present

## 2013-05-19 DIAGNOSIS — R011 Cardiac murmur, unspecified: Secondary | ICD-10-CM | POA: Diagnosis not present

## 2013-05-21 DIAGNOSIS — I369 Nonrheumatic tricuspid valve disorder, unspecified: Secondary | ICD-10-CM | POA: Diagnosis not present

## 2013-06-07 DIAGNOSIS — H40029 Open angle with borderline findings, high risk, unspecified eye: Secondary | ICD-10-CM | POA: Diagnosis not present

## 2013-06-07 DIAGNOSIS — H26499 Other secondary cataract, unspecified eye: Secondary | ICD-10-CM | POA: Diagnosis not present

## 2013-06-07 DIAGNOSIS — H04129 Dry eye syndrome of unspecified lacrimal gland: Secondary | ICD-10-CM | POA: Diagnosis not present

## 2013-06-11 IMAGING — CR DG CHEST 2V
1 series · 1 of 1 positions shown · non-contrast
Comparison: None.

CLINICAL DATA: Syncope; nausea.

CHEST - 2 VIEW

[view not recorded]
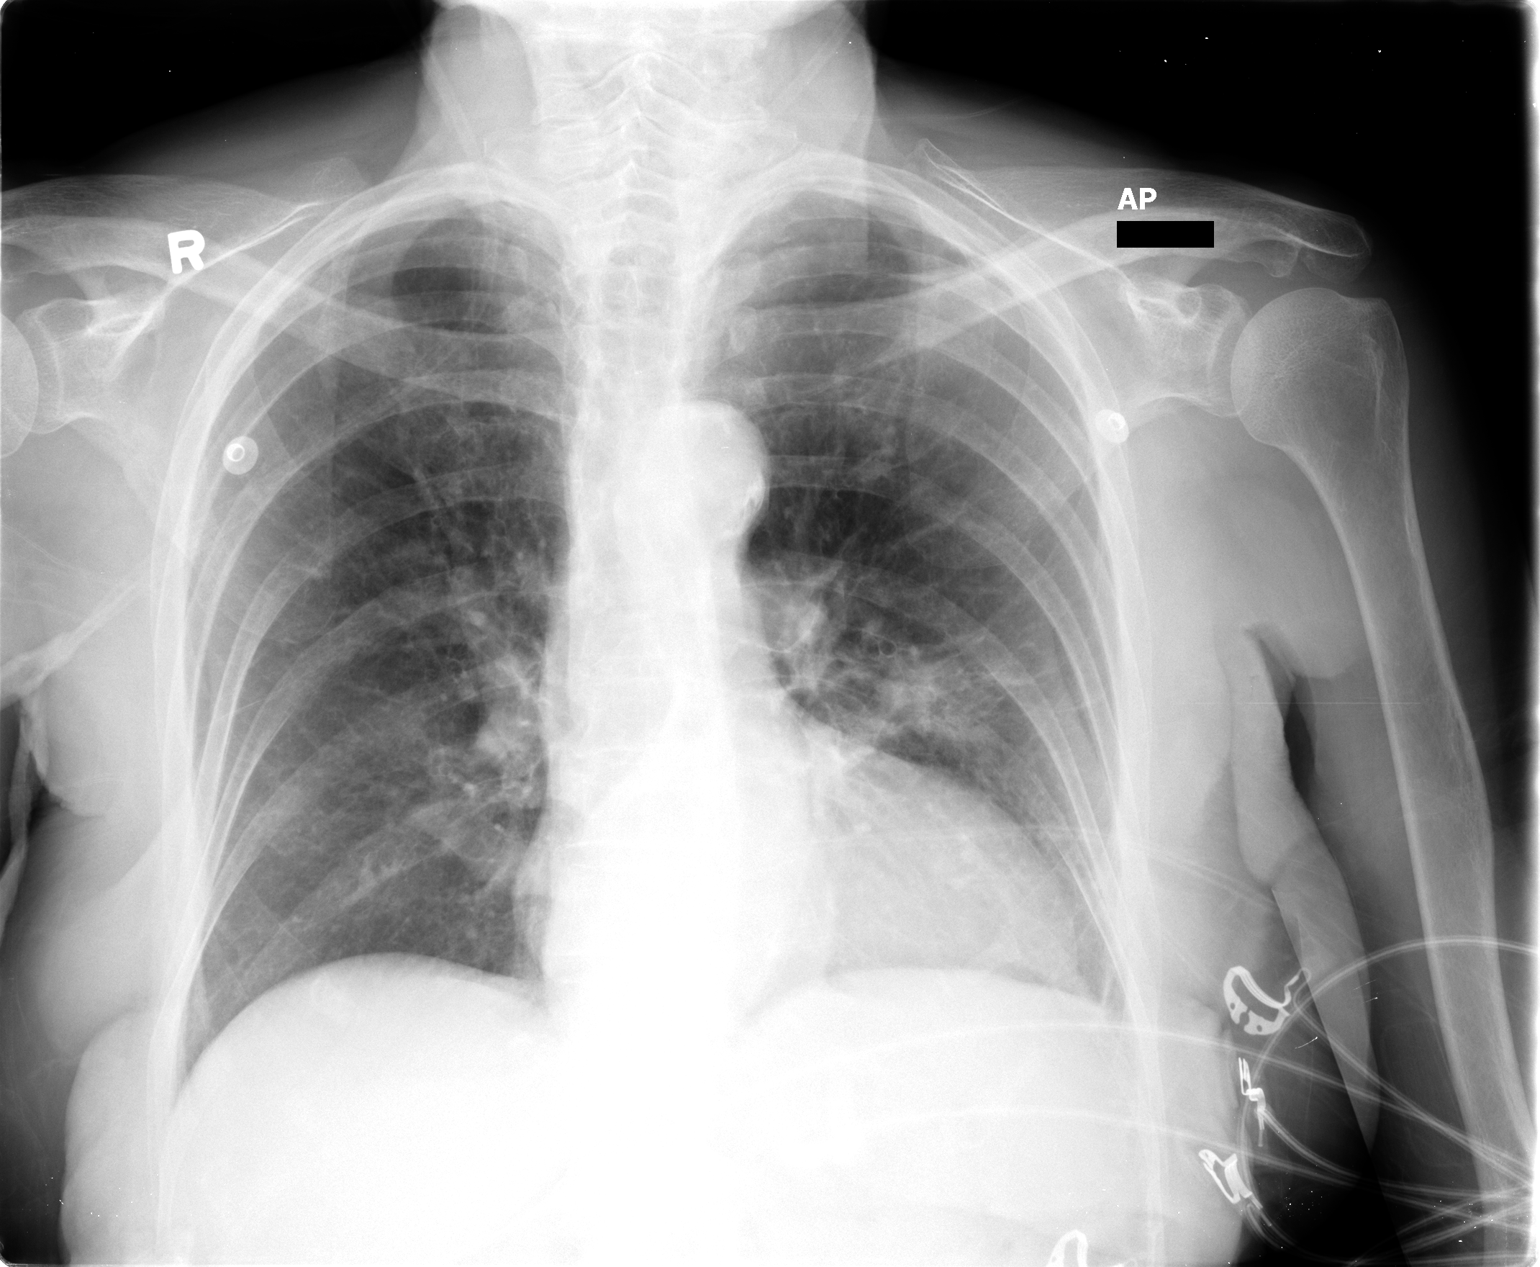

[1 of 1 positions shown; findings below may reference images not displayed]

FINDINGS: The lungs are well-aerated.  Left midlung airspace
opacity raises concern for pneumonia.  There is no evidence of
pleural effusion or pneumothorax.

The heart is normal in size; the mediastinal contour is within
normal limits. Moderate hiatal hernia is noted, with an air-fluid
level.  No acute osseous abnormalities are seen.  There is slight
loss of vertebral body height along the lower thoracic spine.
IMPRESSION: 1.  Left midlung airspace opacity raises concern for pneumonia.  If
the patient has symptoms of pneumonia, would treat for pneumonia
and then perform follow-up chest radiograph to ensure resolution of
airspace opacity.
2.  Moderate hiatal hernia noted, with an air-fluid level.

## 2013-06-16 DIAGNOSIS — H43829 Vitreomacular adhesion, unspecified eye: Secondary | ICD-10-CM | POA: Diagnosis not present

## 2013-06-16 DIAGNOSIS — H35369 Drusen (degenerative) of macula, unspecified eye: Secondary | ICD-10-CM | POA: Diagnosis not present

## 2013-06-16 IMAGING — CR DG CHEST 1V PORT
1 series · 1 of 1 positions shown · non-contrast
Comparison: 03/18/2012.

CLINICAL DATA: Chest soreness.  Infiltrate.

PORTABLE CHEST - 1 VIEW

[AP]
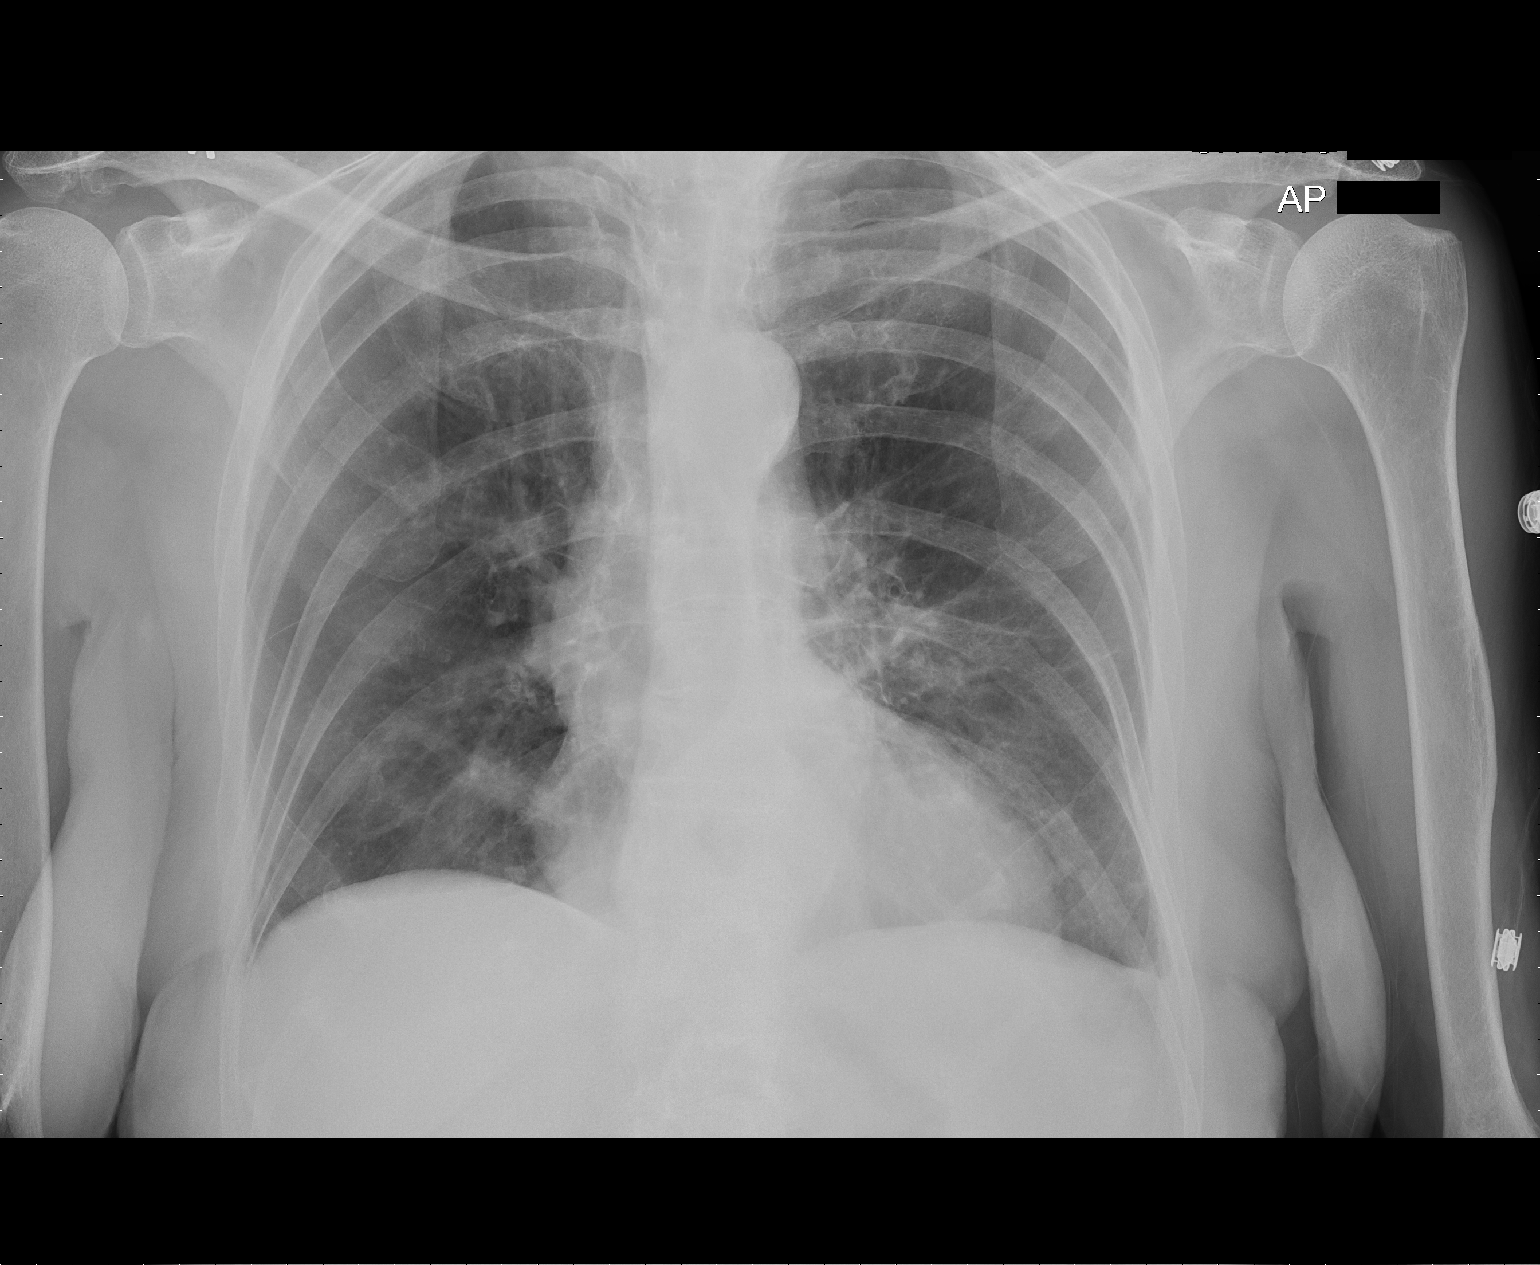

[1 of 1 positions shown; findings below may reference images not displayed]

FINDINGS: Borderline heart size for projection.  Tortuous thoracic
aorta.  Mild basilar atelectasis.  No focal consolidation is
identified.

The density in the left midlung appears improved, which may
represent improving perihilar atelectasis or improving pneumonia.
Favor improving infection based on the time course from 03/18/2012.
Underlying changes of emphysema.
IMPRESSION: 1.  Improving left perihilar opacity likely representing treated
pneumonia.
2.  Emphysema.
3.  Borderline heart size for projection.

## 2013-06-29 DIAGNOSIS — H5460 Unqualified visual loss, one eye, unspecified: Secondary | ICD-10-CM | POA: Diagnosis not present

## 2013-06-29 DIAGNOSIS — H43829 Vitreomacular adhesion, unspecified eye: Secondary | ICD-10-CM | POA: Diagnosis not present

## 2013-07-06 DIAGNOSIS — H43829 Vitreomacular adhesion, unspecified eye: Secondary | ICD-10-CM | POA: Diagnosis not present

## 2013-08-03 DIAGNOSIS — Z23 Encounter for immunization: Secondary | ICD-10-CM | POA: Diagnosis not present

## 2013-08-18 DIAGNOSIS — H43829 Vitreomacular adhesion, unspecified eye: Secondary | ICD-10-CM | POA: Diagnosis not present

## 2013-09-15 DIAGNOSIS — E782 Mixed hyperlipidemia: Secondary | ICD-10-CM | POA: Diagnosis not present

## 2013-09-15 DIAGNOSIS — I10 Essential (primary) hypertension: Secondary | ICD-10-CM | POA: Diagnosis not present

## 2013-09-15 DIAGNOSIS — M1990 Unspecified osteoarthritis, unspecified site: Secondary | ICD-10-CM | POA: Diagnosis not present

## 2013-09-15 DIAGNOSIS — D649 Anemia, unspecified: Secondary | ICD-10-CM | POA: Diagnosis not present

## 2013-09-22 DIAGNOSIS — C189 Malignant neoplasm of colon, unspecified: Secondary | ICD-10-CM | POA: Diagnosis not present

## 2013-09-22 DIAGNOSIS — K279 Peptic ulcer, site unspecified, unspecified as acute or chronic, without hemorrhage or perforation: Secondary | ICD-10-CM | POA: Diagnosis not present

## 2013-09-22 DIAGNOSIS — M810 Age-related osteoporosis without current pathological fracture: Secondary | ICD-10-CM | POA: Diagnosis not present

## 2013-09-22 DIAGNOSIS — I10 Essential (primary) hypertension: Secondary | ICD-10-CM | POA: Diagnosis not present

## 2013-09-22 DIAGNOSIS — E782 Mixed hyperlipidemia: Secondary | ICD-10-CM | POA: Diagnosis not present

## 2013-09-22 DIAGNOSIS — IMO0002 Reserved for concepts with insufficient information to code with codable children: Secondary | ICD-10-CM | POA: Diagnosis not present

## 2013-09-22 DIAGNOSIS — Z1331 Encounter for screening for depression: Secondary | ICD-10-CM | POA: Diagnosis not present

## 2013-09-22 DIAGNOSIS — M1990 Unspecified osteoarthritis, unspecified site: Secondary | ICD-10-CM | POA: Diagnosis not present

## 2013-10-05 DIAGNOSIS — M25559 Pain in unspecified hip: Secondary | ICD-10-CM | POA: Diagnosis not present

## 2013-10-05 DIAGNOSIS — M8448XA Pathological fracture, other site, initial encounter for fracture: Secondary | ICD-10-CM | POA: Diagnosis not present

## 2013-10-05 DIAGNOSIS — M7150 Other bursitis, not elsewhere classified, unspecified site: Secondary | ICD-10-CM | POA: Diagnosis not present

## 2013-10-12 DIAGNOSIS — IMO0001 Reserved for inherently not codable concepts without codable children: Secondary | ICD-10-CM | POA: Diagnosis not present

## 2013-10-12 DIAGNOSIS — M6281 Muscle weakness (generalized): Secondary | ICD-10-CM | POA: Diagnosis not present

## 2013-10-12 DIAGNOSIS — R269 Unspecified abnormalities of gait and mobility: Secondary | ICD-10-CM | POA: Diagnosis not present

## 2013-10-12 DIAGNOSIS — M76899 Other specified enthesopathies of unspecified lower limb, excluding foot: Secondary | ICD-10-CM | POA: Diagnosis not present

## 2013-10-17 DIAGNOSIS — M6281 Muscle weakness (generalized): Secondary | ICD-10-CM | POA: Diagnosis not present

## 2013-10-17 DIAGNOSIS — R269 Unspecified abnormalities of gait and mobility: Secondary | ICD-10-CM | POA: Diagnosis not present

## 2013-10-17 DIAGNOSIS — M76899 Other specified enthesopathies of unspecified lower limb, excluding foot: Secondary | ICD-10-CM | POA: Diagnosis not present

## 2013-10-17 DIAGNOSIS — IMO0001 Reserved for inherently not codable concepts without codable children: Secondary | ICD-10-CM | POA: Diagnosis not present

## 2013-10-19 DIAGNOSIS — R269 Unspecified abnormalities of gait and mobility: Secondary | ICD-10-CM | POA: Diagnosis not present

## 2013-10-19 DIAGNOSIS — M76899 Other specified enthesopathies of unspecified lower limb, excluding foot: Secondary | ICD-10-CM | POA: Diagnosis not present

## 2013-10-19 DIAGNOSIS — M6281 Muscle weakness (generalized): Secondary | ICD-10-CM | POA: Diagnosis not present

## 2013-10-19 DIAGNOSIS — IMO0001 Reserved for inherently not codable concepts without codable children: Secondary | ICD-10-CM | POA: Diagnosis not present

## 2013-10-21 DIAGNOSIS — IMO0001 Reserved for inherently not codable concepts without codable children: Secondary | ICD-10-CM | POA: Diagnosis not present

## 2013-10-21 DIAGNOSIS — M6281 Muscle weakness (generalized): Secondary | ICD-10-CM | POA: Diagnosis not present

## 2013-10-21 DIAGNOSIS — R269 Unspecified abnormalities of gait and mobility: Secondary | ICD-10-CM | POA: Diagnosis not present

## 2013-10-21 DIAGNOSIS — M76899 Other specified enthesopathies of unspecified lower limb, excluding foot: Secondary | ICD-10-CM | POA: Diagnosis not present

## 2013-10-24 DIAGNOSIS — R269 Unspecified abnormalities of gait and mobility: Secondary | ICD-10-CM | POA: Diagnosis not present

## 2013-10-24 DIAGNOSIS — IMO0001 Reserved for inherently not codable concepts without codable children: Secondary | ICD-10-CM | POA: Diagnosis not present

## 2013-10-24 DIAGNOSIS — M76899 Other specified enthesopathies of unspecified lower limb, excluding foot: Secondary | ICD-10-CM | POA: Diagnosis not present

## 2013-10-24 DIAGNOSIS — M6281 Muscle weakness (generalized): Secondary | ICD-10-CM | POA: Diagnosis not present

## 2013-10-26 DIAGNOSIS — M6281 Muscle weakness (generalized): Secondary | ICD-10-CM | POA: Diagnosis not present

## 2013-10-26 DIAGNOSIS — R269 Unspecified abnormalities of gait and mobility: Secondary | ICD-10-CM | POA: Diagnosis not present

## 2013-10-26 DIAGNOSIS — M76899 Other specified enthesopathies of unspecified lower limb, excluding foot: Secondary | ICD-10-CM | POA: Diagnosis not present

## 2013-10-26 DIAGNOSIS — IMO0001 Reserved for inherently not codable concepts without codable children: Secondary | ICD-10-CM | POA: Diagnosis not present

## 2013-10-28 DIAGNOSIS — R269 Unspecified abnormalities of gait and mobility: Secondary | ICD-10-CM | POA: Diagnosis not present

## 2013-10-28 DIAGNOSIS — M6281 Muscle weakness (generalized): Secondary | ICD-10-CM | POA: Diagnosis not present

## 2013-10-28 DIAGNOSIS — IMO0001 Reserved for inherently not codable concepts without codable children: Secondary | ICD-10-CM | POA: Diagnosis not present

## 2013-10-28 DIAGNOSIS — M76899 Other specified enthesopathies of unspecified lower limb, excluding foot: Secondary | ICD-10-CM | POA: Diagnosis not present

## 2013-10-31 DIAGNOSIS — M6281 Muscle weakness (generalized): Secondary | ICD-10-CM | POA: Diagnosis not present

## 2013-10-31 DIAGNOSIS — R269 Unspecified abnormalities of gait and mobility: Secondary | ICD-10-CM | POA: Diagnosis not present

## 2013-10-31 DIAGNOSIS — M76899 Other specified enthesopathies of unspecified lower limb, excluding foot: Secondary | ICD-10-CM | POA: Diagnosis not present

## 2013-10-31 DIAGNOSIS — IMO0001 Reserved for inherently not codable concepts without codable children: Secondary | ICD-10-CM | POA: Diagnosis not present

## 2013-11-02 DIAGNOSIS — M6281 Muscle weakness (generalized): Secondary | ICD-10-CM | POA: Diagnosis not present

## 2013-11-02 DIAGNOSIS — R269 Unspecified abnormalities of gait and mobility: Secondary | ICD-10-CM | POA: Diagnosis not present

## 2013-11-02 DIAGNOSIS — M76899 Other specified enthesopathies of unspecified lower limb, excluding foot: Secondary | ICD-10-CM | POA: Diagnosis not present

## 2013-11-02 DIAGNOSIS — IMO0001 Reserved for inherently not codable concepts without codable children: Secondary | ICD-10-CM | POA: Diagnosis not present

## 2013-11-09 DIAGNOSIS — M6281 Muscle weakness (generalized): Secondary | ICD-10-CM | POA: Diagnosis not present

## 2013-11-09 DIAGNOSIS — R269 Unspecified abnormalities of gait and mobility: Secondary | ICD-10-CM | POA: Diagnosis not present

## 2013-11-09 DIAGNOSIS — M76899 Other specified enthesopathies of unspecified lower limb, excluding foot: Secondary | ICD-10-CM | POA: Diagnosis not present

## 2013-11-09 DIAGNOSIS — IMO0001 Reserved for inherently not codable concepts without codable children: Secondary | ICD-10-CM | POA: Diagnosis not present

## 2013-11-11 DIAGNOSIS — R269 Unspecified abnormalities of gait and mobility: Secondary | ICD-10-CM | POA: Diagnosis not present

## 2013-11-11 DIAGNOSIS — M6281 Muscle weakness (generalized): Secondary | ICD-10-CM | POA: Diagnosis not present

## 2013-11-11 DIAGNOSIS — IMO0001 Reserved for inherently not codable concepts without codable children: Secondary | ICD-10-CM | POA: Diagnosis not present

## 2013-11-11 DIAGNOSIS — M76899 Other specified enthesopathies of unspecified lower limb, excluding foot: Secondary | ICD-10-CM | POA: Diagnosis not present

## 2013-11-14 DIAGNOSIS — M76899 Other specified enthesopathies of unspecified lower limb, excluding foot: Secondary | ICD-10-CM | POA: Diagnosis not present

## 2013-11-14 DIAGNOSIS — M6281 Muscle weakness (generalized): Secondary | ICD-10-CM | POA: Diagnosis not present

## 2013-11-14 DIAGNOSIS — IMO0001 Reserved for inherently not codable concepts without codable children: Secondary | ICD-10-CM | POA: Diagnosis not present

## 2013-11-14 DIAGNOSIS — R269 Unspecified abnormalities of gait and mobility: Secondary | ICD-10-CM | POA: Diagnosis not present

## 2013-11-16 DIAGNOSIS — IMO0001 Reserved for inherently not codable concepts without codable children: Secondary | ICD-10-CM | POA: Diagnosis not present

## 2013-11-16 DIAGNOSIS — M76899 Other specified enthesopathies of unspecified lower limb, excluding foot: Secondary | ICD-10-CM | POA: Diagnosis not present

## 2013-11-16 DIAGNOSIS — M6281 Muscle weakness (generalized): Secondary | ICD-10-CM | POA: Diagnosis not present

## 2013-11-16 DIAGNOSIS — R269 Unspecified abnormalities of gait and mobility: Secondary | ICD-10-CM | POA: Diagnosis not present

## 2013-11-17 DIAGNOSIS — I359 Nonrheumatic aortic valve disorder, unspecified: Secondary | ICD-10-CM | POA: Diagnosis not present

## 2013-11-17 DIAGNOSIS — R011 Cardiac murmur, unspecified: Secondary | ICD-10-CM | POA: Diagnosis not present

## 2013-11-18 DIAGNOSIS — R269 Unspecified abnormalities of gait and mobility: Secondary | ICD-10-CM | POA: Diagnosis not present

## 2013-11-18 DIAGNOSIS — M6281 Muscle weakness (generalized): Secondary | ICD-10-CM | POA: Diagnosis not present

## 2013-11-18 DIAGNOSIS — M76899 Other specified enthesopathies of unspecified lower limb, excluding foot: Secondary | ICD-10-CM | POA: Diagnosis not present

## 2013-11-18 DIAGNOSIS — IMO0001 Reserved for inherently not codable concepts without codable children: Secondary | ICD-10-CM | POA: Diagnosis not present

## 2013-11-21 DIAGNOSIS — M6281 Muscle weakness (generalized): Secondary | ICD-10-CM | POA: Diagnosis not present

## 2013-11-21 DIAGNOSIS — M76899 Other specified enthesopathies of unspecified lower limb, excluding foot: Secondary | ICD-10-CM | POA: Diagnosis not present

## 2013-11-21 DIAGNOSIS — IMO0001 Reserved for inherently not codable concepts without codable children: Secondary | ICD-10-CM | POA: Diagnosis not present

## 2013-11-21 DIAGNOSIS — R269 Unspecified abnormalities of gait and mobility: Secondary | ICD-10-CM | POA: Diagnosis not present

## 2013-11-24 DIAGNOSIS — M76899 Other specified enthesopathies of unspecified lower limb, excluding foot: Secondary | ICD-10-CM | POA: Diagnosis not present

## 2013-11-24 DIAGNOSIS — IMO0001 Reserved for inherently not codable concepts without codable children: Secondary | ICD-10-CM | POA: Diagnosis not present

## 2013-11-24 DIAGNOSIS — R269 Unspecified abnormalities of gait and mobility: Secondary | ICD-10-CM | POA: Diagnosis not present

## 2013-11-24 DIAGNOSIS — M6281 Muscle weakness (generalized): Secondary | ICD-10-CM | POA: Diagnosis not present

## 2013-11-25 DIAGNOSIS — IMO0001 Reserved for inherently not codable concepts without codable children: Secondary | ICD-10-CM | POA: Diagnosis not present

## 2013-11-25 DIAGNOSIS — M76899 Other specified enthesopathies of unspecified lower limb, excluding foot: Secondary | ICD-10-CM | POA: Diagnosis not present

## 2013-11-25 DIAGNOSIS — R269 Unspecified abnormalities of gait and mobility: Secondary | ICD-10-CM | POA: Diagnosis not present

## 2013-11-25 DIAGNOSIS — M6281 Muscle weakness (generalized): Secondary | ICD-10-CM | POA: Diagnosis not present

## 2013-11-28 DIAGNOSIS — IMO0001 Reserved for inherently not codable concepts without codable children: Secondary | ICD-10-CM | POA: Diagnosis not present

## 2013-11-28 DIAGNOSIS — M76899 Other specified enthesopathies of unspecified lower limb, excluding foot: Secondary | ICD-10-CM | POA: Diagnosis not present

## 2013-11-28 DIAGNOSIS — R269 Unspecified abnormalities of gait and mobility: Secondary | ICD-10-CM | POA: Diagnosis not present

## 2013-11-28 DIAGNOSIS — M6281 Muscle weakness (generalized): Secondary | ICD-10-CM | POA: Diagnosis not present

## 2013-11-29 DIAGNOSIS — M25559 Pain in unspecified hip: Secondary | ICD-10-CM | POA: Diagnosis not present

## 2013-11-29 DIAGNOSIS — M5430 Sciatica, unspecified side: Secondary | ICD-10-CM | POA: Diagnosis not present

## 2013-11-29 DIAGNOSIS — M6580 Other synovitis and tenosynovitis, unspecified site: Secondary | ICD-10-CM | POA: Diagnosis not present

## 2013-12-12 DIAGNOSIS — M6281 Muscle weakness (generalized): Secondary | ICD-10-CM | POA: Diagnosis not present

## 2013-12-12 DIAGNOSIS — IMO0001 Reserved for inherently not codable concepts without codable children: Secondary | ICD-10-CM | POA: Diagnosis not present

## 2013-12-12 DIAGNOSIS — M659 Synovitis and tenosynovitis, unspecified: Secondary | ICD-10-CM | POA: Diagnosis not present

## 2013-12-16 DIAGNOSIS — M659 Synovitis and tenosynovitis, unspecified: Secondary | ICD-10-CM | POA: Diagnosis not present

## 2013-12-16 DIAGNOSIS — IMO0001 Reserved for inherently not codable concepts without codable children: Secondary | ICD-10-CM | POA: Diagnosis not present

## 2013-12-16 DIAGNOSIS — M6281 Muscle weakness (generalized): Secondary | ICD-10-CM | POA: Diagnosis not present

## 2013-12-19 DIAGNOSIS — IMO0001 Reserved for inherently not codable concepts without codable children: Secondary | ICD-10-CM | POA: Diagnosis not present

## 2013-12-19 DIAGNOSIS — M659 Synovitis and tenosynovitis, unspecified: Secondary | ICD-10-CM | POA: Diagnosis not present

## 2013-12-19 DIAGNOSIS — M6281 Muscle weakness (generalized): Secondary | ICD-10-CM | POA: Diagnosis not present

## 2013-12-23 DIAGNOSIS — M6281 Muscle weakness (generalized): Secondary | ICD-10-CM | POA: Diagnosis not present

## 2013-12-23 DIAGNOSIS — IMO0001 Reserved for inherently not codable concepts without codable children: Secondary | ICD-10-CM | POA: Diagnosis not present

## 2013-12-23 DIAGNOSIS — M659 Synovitis and tenosynovitis, unspecified: Secondary | ICD-10-CM | POA: Diagnosis not present

## 2013-12-26 DIAGNOSIS — M659 Synovitis and tenosynovitis, unspecified: Secondary | ICD-10-CM | POA: Diagnosis not present

## 2013-12-26 DIAGNOSIS — IMO0001 Reserved for inherently not codable concepts without codable children: Secondary | ICD-10-CM | POA: Diagnosis not present

## 2013-12-26 DIAGNOSIS — M6281 Muscle weakness (generalized): Secondary | ICD-10-CM | POA: Diagnosis not present

## 2013-12-30 DIAGNOSIS — M659 Synovitis and tenosynovitis, unspecified: Secondary | ICD-10-CM | POA: Diagnosis not present

## 2013-12-30 DIAGNOSIS — IMO0001 Reserved for inherently not codable concepts without codable children: Secondary | ICD-10-CM | POA: Diagnosis not present

## 2013-12-30 DIAGNOSIS — M6281 Muscle weakness (generalized): Secondary | ICD-10-CM | POA: Diagnosis not present

## 2014-01-02 DIAGNOSIS — M659 Synovitis and tenosynovitis, unspecified: Secondary | ICD-10-CM | POA: Diagnosis not present

## 2014-01-02 DIAGNOSIS — M6281 Muscle weakness (generalized): Secondary | ICD-10-CM | POA: Diagnosis not present

## 2014-01-02 DIAGNOSIS — IMO0001 Reserved for inherently not codable concepts without codable children: Secondary | ICD-10-CM | POA: Diagnosis not present

## 2014-01-06 DIAGNOSIS — M659 Synovitis and tenosynovitis, unspecified: Secondary | ICD-10-CM | POA: Diagnosis not present

## 2014-01-06 DIAGNOSIS — M6281 Muscle weakness (generalized): Secondary | ICD-10-CM | POA: Diagnosis not present

## 2014-01-06 DIAGNOSIS — IMO0001 Reserved for inherently not codable concepts without codable children: Secondary | ICD-10-CM | POA: Diagnosis not present

## 2014-01-09 DIAGNOSIS — M6281 Muscle weakness (generalized): Secondary | ICD-10-CM | POA: Diagnosis not present

## 2014-01-09 DIAGNOSIS — IMO0001 Reserved for inherently not codable concepts without codable children: Secondary | ICD-10-CM | POA: Diagnosis not present

## 2014-01-09 DIAGNOSIS — M6580 Other synovitis and tenosynovitis, unspecified site: Secondary | ICD-10-CM | POA: Diagnosis not present

## 2014-02-06 DIAGNOSIS — D649 Anemia, unspecified: Secondary | ICD-10-CM | POA: Diagnosis not present

## 2014-02-06 DIAGNOSIS — M810 Age-related osteoporosis without current pathological fracture: Secondary | ICD-10-CM | POA: Diagnosis not present

## 2014-02-06 DIAGNOSIS — I10 Essential (primary) hypertension: Secondary | ICD-10-CM | POA: Diagnosis not present

## 2014-02-06 DIAGNOSIS — E782 Mixed hyperlipidemia: Secondary | ICD-10-CM | POA: Diagnosis not present

## 2014-02-10 DIAGNOSIS — M5126 Other intervertebral disc displacement, lumbar region: Secondary | ICD-10-CM | POA: Diagnosis not present

## 2014-02-10 DIAGNOSIS — M47817 Spondylosis without myelopathy or radiculopathy, lumbosacral region: Secondary | ICD-10-CM | POA: Diagnosis not present

## 2014-02-10 DIAGNOSIS — Z87891 Personal history of nicotine dependence: Secondary | ICD-10-CM | POA: Diagnosis not present

## 2014-02-10 DIAGNOSIS — Z79899 Other long term (current) drug therapy: Secondary | ICD-10-CM | POA: Diagnosis not present

## 2014-02-10 DIAGNOSIS — M48061 Spinal stenosis, lumbar region without neurogenic claudication: Secondary | ICD-10-CM | POA: Diagnosis not present

## 2014-02-10 DIAGNOSIS — IMO0002 Reserved for concepts with insufficient information to code with codable children: Secondary | ICD-10-CM | POA: Diagnosis not present

## 2014-02-10 DIAGNOSIS — I10 Essential (primary) hypertension: Secondary | ICD-10-CM | POA: Diagnosis not present

## 2014-02-11 DIAGNOSIS — M47817 Spondylosis without myelopathy or radiculopathy, lumbosacral region: Secondary | ICD-10-CM | POA: Diagnosis not present

## 2014-02-11 DIAGNOSIS — M48061 Spinal stenosis, lumbar region without neurogenic claudication: Secondary | ICD-10-CM | POA: Diagnosis not present

## 2014-02-11 DIAGNOSIS — M5126 Other intervertebral disc displacement, lumbar region: Secondary | ICD-10-CM | POA: Diagnosis not present

## 2014-02-13 DIAGNOSIS — M1990 Unspecified osteoarthritis, unspecified site: Secondary | ICD-10-CM | POA: Diagnosis not present

## 2014-02-13 DIAGNOSIS — R011 Cardiac murmur, unspecified: Secondary | ICD-10-CM | POA: Diagnosis not present

## 2014-02-13 DIAGNOSIS — Z1331 Encounter for screening for depression: Secondary | ICD-10-CM | POA: Diagnosis not present

## 2014-02-13 DIAGNOSIS — C189 Malignant neoplasm of colon, unspecified: Secondary | ICD-10-CM | POA: Diagnosis not present

## 2014-02-13 DIAGNOSIS — I10 Essential (primary) hypertension: Secondary | ICD-10-CM | POA: Diagnosis not present

## 2014-02-13 DIAGNOSIS — E782 Mixed hyperlipidemia: Secondary | ICD-10-CM | POA: Diagnosis not present

## 2014-02-13 DIAGNOSIS — M810 Age-related osteoporosis without current pathological fracture: Secondary | ICD-10-CM | POA: Diagnosis not present

## 2014-02-13 DIAGNOSIS — K279 Peptic ulcer, site unspecified, unspecified as acute or chronic, without hemorrhage or perforation: Secondary | ICD-10-CM | POA: Diagnosis not present

## 2014-02-17 DIAGNOSIS — M47817 Spondylosis without myelopathy or radiculopathy, lumbosacral region: Secondary | ICD-10-CM | POA: Diagnosis not present

## 2014-03-02 DIAGNOSIS — M5137 Other intervertebral disc degeneration, lumbosacral region: Secondary | ICD-10-CM | POA: Diagnosis not present

## 2014-03-02 DIAGNOSIS — M48061 Spinal stenosis, lumbar region without neurogenic claudication: Secondary | ICD-10-CM | POA: Diagnosis not present

## 2014-03-02 DIAGNOSIS — M47817 Spondylosis without myelopathy or radiculopathy, lumbosacral region: Secondary | ICD-10-CM | POA: Diagnosis not present

## 2014-03-07 DIAGNOSIS — M47817 Spondylosis without myelopathy or radiculopathy, lumbosacral region: Secondary | ICD-10-CM | POA: Diagnosis not present

## 2014-03-07 DIAGNOSIS — I10 Essential (primary) hypertension: Secondary | ICD-10-CM | POA: Diagnosis not present

## 2014-03-07 DIAGNOSIS — M5137 Other intervertebral disc degeneration, lumbosacral region: Secondary | ICD-10-CM | POA: Diagnosis not present

## 2014-03-07 DIAGNOSIS — G8929 Other chronic pain: Secondary | ICD-10-CM | POA: Diagnosis not present

## 2014-03-07 DIAGNOSIS — Z79899 Other long term (current) drug therapy: Secondary | ICD-10-CM | POA: Diagnosis not present

## 2014-03-07 DIAGNOSIS — E785 Hyperlipidemia, unspecified: Secondary | ICD-10-CM | POA: Diagnosis not present

## 2014-03-28 DIAGNOSIS — M48061 Spinal stenosis, lumbar region without neurogenic claudication: Secondary | ICD-10-CM | POA: Diagnosis not present

## 2014-03-28 DIAGNOSIS — M5137 Other intervertebral disc degeneration, lumbosacral region: Secondary | ICD-10-CM | POA: Diagnosis not present

## 2014-03-28 DIAGNOSIS — M47817 Spondylosis without myelopathy or radiculopathy, lumbosacral region: Secondary | ICD-10-CM | POA: Diagnosis not present

## 2014-03-31 DIAGNOSIS — M47817 Spondylosis without myelopathy or radiculopathy, lumbosacral region: Secondary | ICD-10-CM | POA: Diagnosis not present

## 2014-03-31 DIAGNOSIS — I10 Essential (primary) hypertension: Secondary | ICD-10-CM | POA: Diagnosis not present

## 2014-03-31 DIAGNOSIS — M5137 Other intervertebral disc degeneration, lumbosacral region: Secondary | ICD-10-CM | POA: Diagnosis not present

## 2014-03-31 DIAGNOSIS — E785 Hyperlipidemia, unspecified: Secondary | ICD-10-CM | POA: Diagnosis not present

## 2014-03-31 DIAGNOSIS — G8929 Other chronic pain: Secondary | ICD-10-CM | POA: Diagnosis not present

## 2014-03-31 DIAGNOSIS — Z79899 Other long term (current) drug therapy: Secondary | ICD-10-CM | POA: Diagnosis not present

## 2014-04-19 DIAGNOSIS — M47817 Spondylosis without myelopathy or radiculopathy, lumbosacral region: Secondary | ICD-10-CM | POA: Diagnosis not present

## 2014-04-19 DIAGNOSIS — M48061 Spinal stenosis, lumbar region without neurogenic claudication: Secondary | ICD-10-CM | POA: Diagnosis not present

## 2014-04-19 DIAGNOSIS — M5137 Other intervertebral disc degeneration, lumbosacral region: Secondary | ICD-10-CM | POA: Diagnosis not present

## 2014-05-11 DIAGNOSIS — M5137 Other intervertebral disc degeneration, lumbosacral region: Secondary | ICD-10-CM | POA: Diagnosis not present

## 2014-05-11 DIAGNOSIS — M2559 Pain in other specified joint: Secondary | ICD-10-CM | POA: Diagnosis not present

## 2014-05-11 DIAGNOSIS — I10 Essential (primary) hypertension: Secondary | ICD-10-CM | POA: Diagnosis not present

## 2014-05-11 DIAGNOSIS — G8929 Other chronic pain: Secondary | ICD-10-CM | POA: Diagnosis not present

## 2014-05-11 DIAGNOSIS — M47817 Spondylosis without myelopathy or radiculopathy, lumbosacral region: Secondary | ICD-10-CM | POA: Diagnosis not present

## 2014-05-11 DIAGNOSIS — Z79899 Other long term (current) drug therapy: Secondary | ICD-10-CM | POA: Diagnosis not present

## 2014-05-11 DIAGNOSIS — E785 Hyperlipidemia, unspecified: Secondary | ICD-10-CM | POA: Diagnosis not present

## 2014-05-18 DIAGNOSIS — M47817 Spondylosis without myelopathy or radiculopathy, lumbosacral region: Secondary | ICD-10-CM | POA: Diagnosis not present

## 2014-06-05 DIAGNOSIS — M48061 Spinal stenosis, lumbar region without neurogenic claudication: Secondary | ICD-10-CM | POA: Diagnosis not present

## 2014-06-05 DIAGNOSIS — R6889 Other general symptoms and signs: Secondary | ICD-10-CM | POA: Diagnosis not present

## 2014-06-05 DIAGNOSIS — L02619 Cutaneous abscess of unspecified foot: Secondary | ICD-10-CM | POA: Diagnosis not present

## 2014-06-05 DIAGNOSIS — M21619 Bunion of unspecified foot: Secondary | ICD-10-CM | POA: Diagnosis not present

## 2014-06-05 DIAGNOSIS — B353 Tinea pedis: Secondary | ICD-10-CM | POA: Diagnosis not present

## 2014-06-05 DIAGNOSIS — L03119 Cellulitis of unspecified part of limb: Secondary | ICD-10-CM | POA: Diagnosis not present

## 2014-06-12 DIAGNOSIS — I10 Essential (primary) hypertension: Secondary | ICD-10-CM | POA: Diagnosis not present

## 2014-06-12 DIAGNOSIS — I739 Peripheral vascular disease, unspecified: Secondary | ICD-10-CM | POA: Diagnosis not present

## 2014-06-12 DIAGNOSIS — I70209 Unspecified atherosclerosis of native arteries of extremities, unspecified extremity: Secondary | ICD-10-CM | POA: Diagnosis not present

## 2014-06-12 DIAGNOSIS — Z87891 Personal history of nicotine dependence: Secondary | ICD-10-CM | POA: Diagnosis not present

## 2014-06-12 DIAGNOSIS — M79609 Pain in unspecified limb: Secondary | ICD-10-CM | POA: Diagnosis not present

## 2014-06-15 DIAGNOSIS — Z01818 Encounter for other preprocedural examination: Secondary | ICD-10-CM | POA: Diagnosis not present

## 2014-06-15 DIAGNOSIS — J438 Other emphysema: Secondary | ICD-10-CM | POA: Diagnosis not present

## 2014-06-15 DIAGNOSIS — M47817 Spondylosis without myelopathy or radiculopathy, lumbosacral region: Secondary | ICD-10-CM | POA: Diagnosis not present

## 2014-06-19 DIAGNOSIS — H9190 Unspecified hearing loss, unspecified ear: Secondary | ICD-10-CM | POA: Diagnosis not present

## 2014-06-19 DIAGNOSIS — Z79899 Other long term (current) drug therapy: Secondary | ICD-10-CM | POA: Diagnosis not present

## 2014-06-19 DIAGNOSIS — M47817 Spondylosis without myelopathy or radiculopathy, lumbosacral region: Secondary | ICD-10-CM | POA: Diagnosis not present

## 2014-06-19 DIAGNOSIS — M519 Unspecified thoracic, thoracolumbar and lumbosacral intervertebral disc disorder: Secondary | ICD-10-CM | POA: Diagnosis not present

## 2014-06-19 DIAGNOSIS — I10 Essential (primary) hypertension: Secondary | ICD-10-CM | POA: Diagnosis not present

## 2014-06-19 HISTORY — PX: SPINE SURGERY: SHX786

## 2014-06-28 ENCOUNTER — Other Ambulatory Visit: Payer: Self-pay | Admitting: *Deleted

## 2014-06-28 ENCOUNTER — Encounter: Payer: Self-pay | Admitting: Vascular Surgery

## 2014-06-28 DIAGNOSIS — I739 Peripheral vascular disease, unspecified: Secondary | ICD-10-CM

## 2014-06-29 ENCOUNTER — Encounter: Payer: Self-pay | Admitting: Vascular Surgery

## 2014-06-30 ENCOUNTER — Ambulatory Visit (HOSPITAL_COMMUNITY)
Admission: RE | Admit: 2014-06-30 | Discharge: 2014-06-30 | Disposition: A | Discharge status: Home / Self Care | Payer: Medicare Other | Source: Ambulatory Visit | Attending: Vascular Surgery | Admitting: Vascular Surgery

## 2014-06-30 ENCOUNTER — Encounter (HOSPITAL_COMMUNITY): Payer: Self-pay | Admitting: Pharmacy Technician

## 2014-06-30 ENCOUNTER — Encounter: Payer: Self-pay | Admitting: Vascular Surgery

## 2014-06-30 ENCOUNTER — Other Ambulatory Visit: Payer: Self-pay

## 2014-06-30 ENCOUNTER — Ambulatory Visit (INDEPENDENT_AMBULATORY_CARE_PROVIDER_SITE_OTHER): Payer: Medicare Other | Admitting: Vascular Surgery

## 2014-06-30 VITALS — BP 127/77 | HR 78 | Resp 16 | Ht 60.0 in | Wt 95.5 lb

## 2014-06-30 DIAGNOSIS — I739 Peripheral vascular disease, unspecified: Secondary | ICD-10-CM

## 2014-06-30 DIAGNOSIS — I70229 Atherosclerosis of native arteries of extremities with rest pain, unspecified extremity: Secondary | ICD-10-CM

## 2014-06-30 DIAGNOSIS — I999 Unspecified disorder of circulatory system: Secondary | ICD-10-CM

## 2014-06-30 DIAGNOSIS — I70269 Atherosclerosis of native arteries of extremities with gangrene, unspecified extremity: Secondary | ICD-10-CM | POA: Insufficient documentation

## 2014-06-30 DIAGNOSIS — I998 Other disorder of circulatory system: Secondary | ICD-10-CM

## 2014-06-30 NOTE — Progress Notes (Signed)
  Referred by:  Steven E Burdine, MD [address] [place], [place] [address]  Reason for referral: right leg pain  History of Present Illness  Mary [name] is a 80 y.o. (06/26/1923) female who presents with chief complaint: right leg and foot pain.  Onset of symptom occurred January 15 but has worsened since April.  Pain is described as sharp and crampy, severity 3-6/10, and associated with short distance ambulation and occasionally at rest.  Patient has attempted to treat this pain with rest.  The patient has intermittent rest pain symptoms also and few ischemic areas on R foot for tight shoe.  Atherosclerotic risk factors include: HTN, former smoker.  Past Medical History  Diagnosis Date  . Hypertension   . Osteoporosis   . Pneumonia   . Anemia   . Arthritis   . Cancer     colon cancer  . Peripheral vascular disease     Past Surgical History  Procedure Laterality Date  . Cholecystectomy    . Colon surgery      colon cancer  . Tubal ligation    . Colonoscopy  03/19/2012    Procedure: COLONOSCOPY;  Surgeon: Jay M Pyrtle, MD;  Location: [facility] END[facility];  Service: Gastroenterology;  Laterality: N/A;  . Esophagogastroduodenoscopy  03/19/2012    Procedure: ESOPHAGOGASTRODUODENOSCOPY (EGD);  Surgeon: Jay M Pyrtle, MD;  Location: [facility] ENDOSCOPY;  Service: Gastroenterology;  Laterality: N/A;  . Esophagogastroduodenoscopy  03/20/2012    Procedure: ESOPHAGOGASTRODUODENOSCOPY (EGD);  Surgeon: Jay M Pyrtle, MD;  Location: [facility];  Service: Gastroenterology;  Laterality: N/A;  . Colonoscopy  03/20/2012    Procedure: COLONOSCOPY;  Surgeon: Jay M Pyrtle, MD;  Location: [facility] ENDOS[facility];  Service: Gastroenterology;  Laterality: N/A;  . Spine surgery  06-19-14    History   Social History  . Marital Status: Widowed    Spouse Name: N/A    Number of Children: N/A  . Years of Education: N/A   Occupational History  . Not on file.   Social History Main Topics  . Smoking status: Former  Smoker    Quit date: 03/19/1979  . Smokeless tobacco: Never Used  . Alcohol Use: No  . Drug Use: No  . Sexual Activity: Not Currently   Other Topics Concern  . Not on file   Social History Narrative  . No narrative on file    Family History  Problem Relation Age of Onset  . Stroke Mother   . Hypertension Sister   . Cancer Brother   . Heart attack Brother   . Cancer Daughter   . Hyperlipidemia Daughter   . Cancer Son   . Cancer Daughter   . Cancer Son      Current Outpatient Prescriptions on File Prior to Visit  Medication Sig Dispense Refill  . Calcium Carbonate-Vitamin D (CALCIUM + D PO) Take 1 tablet by mouth 2 (two) times daily.      . losartan-hydrochlorothiazide (HYZAAR) 100-25 MG per tablet Take 1 tablet by mouth daily.      . Red Yeast Rice 600 MG TABS Take 1 tablet by mouth daily.      . amoxicillin-clarithromycin-lansoprazole (PREVPAC) combo pack Take by mouth 2 (two) times daily. Follow package directions.  1 kit  0  . diltiazem (DILACOR XR) 240 MG 24 hr capsule Take 240 mg by mouth daily.      . docusate sodium (COLACE) 100 MG capsule Take 100 mg by mouth 2 (two) times daily. Stool softener      .   Heparin Lock Flush (HEPARIN FLUSH) 10 UNIT/ML injection 4,000 Units once.      . pantoprazole (PROTONIX) 40 MG tablet Take 1 tablet (40 mg total) by mouth 2 (two) times daily before a meal.  60 tablet  2   No current facility-administered medications on file prior to visit.    No Known Allergies   REVIEW OF SYSTEMS:  (Positives checked otherwise negative)  CARDIOVASCULAR:  [] chest pain, [] chest pressure, [] palpitations, [] shortness of breath when laying flat, [] shortness of breath with exertion,  [x] pain in feet when walking, [x] pain in feet when laying flat, [] history of blood clot in veins (DVT), [] history of phlebitis, [] swelling in legs, [] varicose veins  PULMONARY:  [] productive cough, [] asthma, [] wheezing  NEUROLOGIC:  [x] weakness in arms or  legs, [] numbness in arms or legs, [] difficulty speaking or slurred speech, [] temporary loss of vision in one eye, [] dizziness  HEMATOLOGIC:  [] bleeding problems, [] problems with blood clotting too easily  MUSCULOSKEL:  [] joint pain, [] joint swelling  GASTROINTEST:  [] vomiting blood, [] blood in stool     GENITOURINARY:  [] burning with urination, [] blood in urine  PSYCHIATRIC:  [] history of major depression  INTEGUMENTARY:  [] rashes, [x] ulcers  CONSTITUTIONAL:  [] fever, [] chills  For VQI Use Only  PRE-ADM LIVING: Nursing home  AMB STATUS: Ambulatory  CAD Sx: None  PRIOR CHF: None  STRESS TEST: [x] No, [ ] Normal, [ ] + ischemia, [ ] + MI, [ ] Both   Physical Examination  Filed Vitals:   06/30/14 1510  BP: 127/77  Pulse: 78  Resp: 16  Height: 5' (1.524 m)  Weight: 95 lb 8 oz (43.319 kg)  SpO2: 98%   Body mass index is 18.65 kg/(m^2).  General: A&O x 3, WD, elderly, emaciated   Head: [place]/AT, Temporalis wasting,   Ear/Nose/Throat: Hearing grossly intact, nares w/o erythema or drainage, oropharynx w/o Erythema/Exudate, Mallampati score: 2  Eyes: PERRLA, EOMI  Neck: Supple, no nuchal rigidity, no palpable LAD  Pulmonary: Sym exp, good air movt, CTAB, no rales, rhonchi, & wheezing  Cardiac: RRR, with extrabeats, Nl S1, S2, + Murmur, splitting sound at MV listening area  Vascular: Vessel Right Left  Radial Palpable Palpable  Brachial Palpable Palpable  Carotid Palpable, without bruit Palpable, without bruit  Aorta Not palpable N/A  Femoral Palpable Palpable  Popliteal Not palpable Not palpable  PT Palpable Palpable  DP Palpable Palpable   Gastrointestinal: soft, NTND, -G/R, - HSM, - masses, - CVAT B  Musculoskeletal: M/S 5/5 throughout including DF/PF in R foot, muscle wasting in all extremities, Extremities without ischemic changes except  R foot with dependent rubor and two small areas of ischemic skin over 1st MT and great  toe  Neurologic: CN 2-12 intact , Pain and light touch intact in extremities , Motor exam as listed above  Psychiatric: Judgment intact, Mood & affect appropriate for pt's clinical situation  Dermatologic: See M/S exam for extremity exam, no rashes otherwise noted  Lymph : No Cervical, Axillary, or Inguinal lymphadenopathy   Non-Invasive Vascular Imaging Outside ABI (Date: 06/12/14)  R: 0.08  L: 0.83  BLE arterial duplex (06/30/2014)   R: monophasic flow throughout, likely R iliac occlusion, only peroneal visualized distally  L: Triphasic through except AT monophasic  Outside Studies/Documentation 15 pages of outside documents were reviewed including: outpatient PCP charts, neurosurgery charts,  and outside ABI.   Medical Decision Making  Mary [name] is a 80 y.o. female who presents with: RLE critical limb ischemia, mild-mod LLE PAD   The patient has intact motor and sensation, so her likely R iliac occlusion is likely chronic.  She has no evidence of threatened limb, but she might already have early onset of dry gangrene in the R foot.  I discussed with the patient the natural history of critical limb ischemia: 25% require amputation in one year, 50% are able to maintain their limbs in one year, and 25-30% die in one year due to comorbidities.  Given the limb threatening status of this patient, I recommend an aggressive work up including proceeding with an: Aortogram, Bilateral runoff and intervention R leg I discussed with the patient the nature of angiographic procedures, especially the limited patencies of any endovascular intervention. The patient is aware of that the risks of an angiographic procedure include but are not limited to: bleeding, infection, access site complications, embolization, rupture of treated vessel, dissection, possible need for emergent surgical intervention, and possible need for surgical procedures to treat the patient's pathology. The patient is  aware of the risks and agrees to proceed.  The procedure is scheduled for: 24 AUG 15.  I discussed in depth with the patient the nature of atherosclerosis, and emphasized the importance of maximal medical management including strict control of blood pressure, blood glucose, and lipid levels, antiplatelet agents, obtaining regular exercise, and cessation of smoking.  The patient is aware that without maximal medical management the underlying atherosclerotic disease process will progress, limiting the benefit of any interventions. The patient is currently not on a statin or anti-platelet. This will be addressed post-procedure.  Thank you for allowing us to participate in this patient's care.  Benisha Hadaway, MD [facility] Office: [phone] Pager: [phone]  06/30/2014, 3:32 PM   

## 2014-07-02 DIAGNOSIS — M479 Spondylosis, unspecified: Secondary | ICD-10-CM | POA: Diagnosis not present

## 2014-07-02 DIAGNOSIS — M629 Disorder of muscle, unspecified: Secondary | ICD-10-CM | POA: Diagnosis not present

## 2014-07-02 DIAGNOSIS — Z79899 Other long term (current) drug therapy: Secondary | ICD-10-CM | POA: Diagnosis not present

## 2014-07-02 DIAGNOSIS — M79609 Pain in unspecified limb: Secondary | ICD-10-CM | POA: Diagnosis not present

## 2014-07-02 DIAGNOSIS — T8131XA Disruption of external operation (surgical) wound, not elsewhere classified, initial encounter: Secondary | ICD-10-CM | POA: Diagnosis not present

## 2014-07-02 DIAGNOSIS — I998 Other disorder of circulatory system: Secondary | ICD-10-CM | POA: Diagnosis not present

## 2014-07-02 DIAGNOSIS — M810 Age-related osteoporosis without current pathological fracture: Secondary | ICD-10-CM | POA: Diagnosis not present

## 2014-07-02 DIAGNOSIS — I10 Essential (primary) hypertension: Secondary | ICD-10-CM | POA: Diagnosis not present

## 2014-07-02 DIAGNOSIS — M2420 Disorder of ligament, unspecified site: Secondary | ICD-10-CM | POA: Diagnosis not present

## 2014-07-02 DIAGNOSIS — T819XXA Unspecified complication of procedure, initial encounter: Secondary | ICD-10-CM | POA: Diagnosis not present

## 2014-07-02 DIAGNOSIS — F17200 Nicotine dependence, unspecified, uncomplicated: Secondary | ICD-10-CM | POA: Diagnosis not present

## 2014-07-02 MED ORDER — SODIUM CHLORIDE 0.9 % IV SOLN
INTRAVENOUS | Status: DC
  Administered 2014-07-03: 11:00:00 via INTRAVENOUS

## 2014-07-03 ENCOUNTER — Inpatient Hospital Stay (HOSPITAL_COMMUNITY)
Admission: RE | Admit: 2014-07-03 | Discharge: 2014-07-10 | DRG: 237 | Disposition: A | Discharge status: Skilled Nursing Facility | Payer: Medicare Other | Source: Ambulatory Visit | Attending: Vascular Surgery | Admitting: Vascular Surgery

## 2014-07-03 ENCOUNTER — Encounter: Payer: Medicare Other | Admitting: Neurosurgery

## 2014-07-03 ENCOUNTER — Other Ambulatory Visit: Payer: Self-pay | Admitting: *Deleted

## 2014-07-03 ENCOUNTER — Encounter (HOSPITAL_COMMUNITY)
Admission: RE | Disposition: A | Discharge status: Skilled Nursing Facility | Payer: Self-pay | Source: Ambulatory Visit | Attending: Vascular Surgery

## 2014-07-03 ENCOUNTER — Other Ambulatory Visit: Payer: Self-pay

## 2014-07-03 DIAGNOSIS — M629 Disorder of muscle, unspecified: Secondary | ICD-10-CM | POA: Diagnosis not present

## 2014-07-03 DIAGNOSIS — Z9889 Other specified postprocedural states: Secondary | ICD-10-CM | POA: Diagnosis not present

## 2014-07-03 DIAGNOSIS — I447 Left bundle-branch block, unspecified: Secondary | ICD-10-CM | POA: Diagnosis not present

## 2014-07-03 DIAGNOSIS — R627 Adult failure to thrive: Secondary | ICD-10-CM | POA: Diagnosis present

## 2014-07-03 DIAGNOSIS — I454 Nonspecific intraventricular block: Secondary | ICD-10-CM | POA: Diagnosis present

## 2014-07-03 DIAGNOSIS — I70229 Atherosclerosis of native arteries of extremities with rest pain, unspecified extremity: Secondary | ICD-10-CM | POA: Diagnosis not present

## 2014-07-03 DIAGNOSIS — Z48812 Encounter for surgical aftercare following surgery on the circulatory system: Secondary | ICD-10-CM | POA: Diagnosis not present

## 2014-07-03 DIAGNOSIS — E43 Unspecified severe protein-calorie malnutrition: Secondary | ICD-10-CM | POA: Diagnosis present

## 2014-07-03 DIAGNOSIS — I998 Other disorder of circulatory system: Secondary | ICD-10-CM

## 2014-07-03 DIAGNOSIS — M129 Arthropathy, unspecified: Secondary | ICD-10-CM | POA: Diagnosis not present

## 2014-07-03 DIAGNOSIS — I129 Hypertensive chronic kidney disease with stage 1 through stage 4 chronic kidney disease, or unspecified chronic kidney disease: Secondary | ICD-10-CM | POA: Diagnosis present

## 2014-07-03 DIAGNOSIS — E876 Hypokalemia: Secondary | ICD-10-CM | POA: Diagnosis present

## 2014-07-03 DIAGNOSIS — Z79899 Other long term (current) drug therapy: Secondary | ICD-10-CM | POA: Diagnosis not present

## 2014-07-03 DIAGNOSIS — Z5189 Encounter for other specified aftercare: Secondary | ICD-10-CM | POA: Diagnosis not present

## 2014-07-03 DIAGNOSIS — D72829 Elevated white blood cell count, unspecified: Secondary | ICD-10-CM | POA: Diagnosis not present

## 2014-07-03 DIAGNOSIS — I70269 Atherosclerosis of native arteries of extremities with gangrene, unspecified extremity: Secondary | ICD-10-CM | POA: Diagnosis not present

## 2014-07-03 DIAGNOSIS — I708 Atherosclerosis of other arteries: Secondary | ICD-10-CM | POA: Diagnosis present

## 2014-07-03 DIAGNOSIS — I4439 Other atrioventricular block: Secondary | ICD-10-CM

## 2014-07-03 DIAGNOSIS — D649 Anemia, unspecified: Secondary | ICD-10-CM | POA: Diagnosis not present

## 2014-07-03 DIAGNOSIS — I739 Peripheral vascular disease, unspecified: Secondary | ICD-10-CM

## 2014-07-03 DIAGNOSIS — L97509 Non-pressure chronic ulcer of other part of unspecified foot with unspecified severity: Secondary | ICD-10-CM | POA: Diagnosis not present

## 2014-07-03 DIAGNOSIS — Z681 Body mass index (BMI) 19 or less, adult: Secondary | ICD-10-CM

## 2014-07-03 DIAGNOSIS — M810 Age-related osteoporosis without current pathological fracture: Secondary | ICD-10-CM | POA: Diagnosis present

## 2014-07-03 DIAGNOSIS — L97511 Non-pressure chronic ulcer of other part of right foot limited to breakdown of skin: Secondary | ICD-10-CM

## 2014-07-03 DIAGNOSIS — I10 Essential (primary) hypertension: Secondary | ICD-10-CM | POA: Diagnosis not present

## 2014-07-03 DIAGNOSIS — Z85038 Personal history of other malignant neoplasm of large intestine: Secondary | ICD-10-CM | POA: Diagnosis not present

## 2014-07-03 DIAGNOSIS — N183 Chronic kidney disease, stage 3 unspecified: Secondary | ICD-10-CM | POA: Diagnosis present

## 2014-07-03 DIAGNOSIS — Z87891 Personal history of nicotine dependence: Secondary | ICD-10-CM | POA: Diagnosis not present

## 2014-07-03 DIAGNOSIS — E785 Hyperlipidemia, unspecified: Secondary | ICD-10-CM | POA: Diagnosis present

## 2014-07-03 DIAGNOSIS — M1990 Unspecified osteoarthritis, unspecified site: Secondary | ICD-10-CM | POA: Diagnosis not present

## 2014-07-03 DIAGNOSIS — R5381 Other malaise: Secondary | ICD-10-CM | POA: Diagnosis present

## 2014-07-03 DIAGNOSIS — I70209 Unspecified atherosclerosis of native arteries of extremities, unspecified extremity: Secondary | ICD-10-CM | POA: Diagnosis present

## 2014-07-03 DIAGNOSIS — R293 Abnormal posture: Secondary | ICD-10-CM | POA: Diagnosis not present

## 2014-07-03 DIAGNOSIS — I498 Other specified cardiac arrhythmias: Secondary | ICD-10-CM | POA: Diagnosis not present

## 2014-07-03 DIAGNOSIS — I4510 Unspecified right bundle-branch block: Secondary | ICD-10-CM | POA: Diagnosis not present

## 2014-07-03 DIAGNOSIS — M79609 Pain in unspecified limb: Secondary | ICD-10-CM | POA: Diagnosis not present

## 2014-07-03 DIAGNOSIS — R636 Underweight: Secondary | ICD-10-CM | POA: Diagnosis present

## 2014-07-03 DIAGNOSIS — L98499 Non-pressure chronic ulcer of skin of other sites with unspecified severity: Secondary | ICD-10-CM

## 2014-07-03 DIAGNOSIS — Z01810 Encounter for preprocedural cardiovascular examination: Secondary | ICD-10-CM

## 2014-07-03 DIAGNOSIS — Z09 Encounter for follow-up examination after completed treatment for conditions other than malignant neoplasm: Secondary | ICD-10-CM | POA: Diagnosis not present

## 2014-07-03 DIAGNOSIS — R64 Cachexia: Secondary | ICD-10-CM | POA: Diagnosis present

## 2014-07-03 DIAGNOSIS — M6281 Muscle weakness (generalized): Secondary | ICD-10-CM | POA: Diagnosis not present

## 2014-07-03 DIAGNOSIS — Z01818 Encounter for other preprocedural examination: Secondary | ICD-10-CM

## 2014-07-03 DIAGNOSIS — E871 Hypo-osmolality and hyponatremia: Secondary | ICD-10-CM | POA: Diagnosis present

## 2014-07-03 DIAGNOSIS — R52 Pain, unspecified: Secondary | ICD-10-CM | POA: Diagnosis present

## 2014-07-03 HISTORY — PX: ABDOMINAL AORTAGRAM: SHX5454

## 2014-07-03 HISTORY — DX: Other atrioventricular block: I44.39

## 2014-07-03 LAB — POCT I-STAT, CHEM 8
BUN: 33 mg/dL — ABNORMAL HIGH (ref 6–23)
Calcium, Ion: 1.07 mmol/L — ABNORMAL LOW (ref 1.13–1.30)
Chloride: 108 mEq/L (ref 96–112)
Creatinine, Ser: 1.5 mg/dL — ABNORMAL HIGH (ref 0.50–1.10)
Glucose, Bld: 114 mg/dL — ABNORMAL HIGH (ref 70–99)
HCT: 38 % (ref 36.0–46.0)
Hemoglobin: 12.9 g/dL (ref 12.0–15.0)
POTASSIUM: 2.9 meq/L — AB (ref 3.7–5.3)
SODIUM: 129 meq/L — AB (ref 137–147)
TCO2: 23 mmol/L (ref 0–100)

## 2014-07-03 SURGERY — ABDOMINAL AORTAGRAM
Anesthesia: LOCAL

## 2014-07-03 MED ORDER — HEPARIN (PORCINE) IN NACL 2-0.9 UNIT/ML-% IJ SOLN
INTRAMUSCULAR | Status: AC
  Filled 2014-07-03: qty 1000

## 2014-07-03 MED ORDER — LIDOCAINE HCL (PF) 1 % IJ SOLN
INTRAMUSCULAR | Status: AC
  Filled 2014-07-03: qty 30

## 2014-07-03 MED ORDER — SODIUM CHLORIDE 0.9 % IV SOLN
1.0000 mL/kg/h | INTRAVENOUS | Status: DC
  Administered 2014-07-04: 1 mL/kg/h via INTRAVENOUS

## 2014-07-03 MED ORDER — MORPHINE SULFATE 2 MG/ML IJ SOLN
2.0000 mg | INTRAMUSCULAR | Status: DC | PRN
  Administered 2014-07-03 – 2014-07-05 (×7): 2 mg via INTRAVENOUS
  Filled 2014-07-03 (×7): qty 1

## 2014-07-03 MED ORDER — MORPHINE SULFATE 2 MG/ML IJ SOLN
INTRAMUSCULAR | Status: AC
  Filled 2014-07-03: qty 1

## 2014-07-03 MED ORDER — MORPHINE SULFATE 2 MG/ML IJ SOLN
INTRAMUSCULAR | Status: AC
  Administered 2014-07-03 (×2): 2 mg via INTRAVENOUS
  Filled 2014-07-03: qty 1

## 2014-07-03 MED ORDER — POTASSIUM CHLORIDE CRYS ER 20 MEQ PO TBCR
40.0000 meq | EXTENDED_RELEASE_TABLET | Freq: Once | ORAL | Status: AC
  Administered 2014-07-03: 40 meq via ORAL

## 2014-07-03 MED ORDER — POTASSIUM CHLORIDE CRYS ER 20 MEQ PO TBCR
EXTENDED_RELEASE_TABLET | ORAL | Status: AC
  Filled 2014-07-03: qty 2

## 2014-07-03 MED ORDER — ACETAMINOPHEN 325 MG PO TABS: 650.0000 mg | ORAL_TABLET | ORAL | Status: DC | PRN

## 2014-07-03 NOTE — Interval H&P Note (Signed)
History and Physical Interval Note:  07/03/2014 11:43 AM  Mary [name]  has presented today for surgery, with the diagnosis of pvd  The various methods of treatment have been discussed with the patient and family. After consideration of risks, benefits and other options for treatment, the patient has consented to  Procedure(s): ABDOMINAL AORTAGRAM (N/A) as a surgical intervention .  The patient's history has been reviewed, patient examined, no change in status, stable for surgery.  I have reviewed the patient's chart and labs.  Questions were answered to the patient's satisfaction.     Nyshaun Standage LIANG-YU

## 2014-07-03 NOTE — Progress Notes (Signed)
CLIENT CALLED ME INTO HER ROOM AND C/O 10/10 RIGHT FOOT PAIN; PAGED DR Bridgett Larsson; NO ANSWER FROM DR Bridgett Larsson

## 2014-07-03 NOTE — H&P (View-Only) (Signed)
Referred by:  Curlene Labrum, MD [address] [place], [place] [address]  Reason for referral: right leg pain  History of Present Illness  Mary [name] is a 80 y.o. ([birth year]) female who presents with chief complaint: right leg and foot pain.  Onset of symptom occurred January 15 but has worsened since April.  Pain is described as sharp and crampy, severity 3-6/10, and associated with short distance ambulation and occasionally at rest.  Patient has attempted to treat this pain with rest.  The patient has intermittent rest pain symptoms also and few ischemic areas on R foot for tight shoe.  Atherosclerotic risk factors include: HTN, former smoker.  Past Medical History  Diagnosis Date  . Hypertension   . Osteoporosis   . Pneumonia   . Anemia   . Arthritis   . Cancer     colon cancer  . Peripheral vascular disease     Past Surgical History  Procedure Laterality Date  . Cholecystectomy    . Colon surgery      colon cancer  . Tubal ligation    . Colonoscopy  03/19/2012    Procedure: COLONOSCOPY;  Surgeon: Jerene Bears, MD;  Location: [facility];  Service: Gastroenterology;  Laterality: N/A;  . Esophagogastroduodenoscopy  03/19/2012    Procedure: ESOPHAGOGASTRODUODENOSCOPY (EGD);  Surgeon: Jerene Bears, MD;  Location: [facility];  Service: Gastroenterology;  Laterality: N/A;  . Esophagogastroduodenoscopy  03/20/2012    Procedure: ESOPHAGOGASTRODUODENOSCOPY (EGD);  Surgeon: Jerene Bears, MD;  Location: [facility];  Service: Gastroenterology;  Laterality: N/A;  . Colonoscopy  03/20/2012    Procedure: COLONOSCOPY;  Surgeon: Jerene Bears, MD;  Location: [facility];  Service: Gastroenterology;  Laterality: N/A;  . Spine surgery  06-19-14    History   Social History  . Marital Status: Widowed    Spouse Name: N/A    Number of Children: N/A  . Years of Education: N/A   Occupational History  . Not on file.   Social History Main Topics  . Smoking status: Former  Smoker    Quit date: 03/19/1979  . Smokeless tobacco: Never Used  . Alcohol Use: No  . Drug Use: No  . Sexual Activity: Not Currently   Other Topics Concern  . Not on file   Social History Narrative  . No narrative on file    Family History  Problem Relation Age of Onset  . Stroke Mother   . Hypertension Sister   . Cancer Brother   . Heart attack Brother   . Cancer Daughter   . Hyperlipidemia Daughter   . Cancer Son   . Cancer Daughter   . Cancer Son      Current Outpatient Prescriptions on File Prior to Visit  Medication Sig Dispense Refill  . Calcium Carbonate-Vitamin D (CALCIUM + D PO) Take 1 tablet by mouth 2 (two) times daily.      Marland Kitchen losartan-hydrochlorothiazide (HYZAAR) 100-25 MG per tablet Take 1 tablet by mouth daily.      . Red Yeast Rice 600 MG TABS Take 1 tablet by mouth daily.      Marland Kitchen amoxicillin-clarithromycin-lansoprazole (PREVPAC) combo pack Take by mouth 2 (two) times daily. Follow package directions.  1 kit  0  . diltiazem (DILACOR XR) 240 MG 24 hr capsule Take 240 mg by mouth daily.      Marland Kitchen docusate sodium (COLACE) 100 MG capsule Take 100 mg by mouth 2 (two) times daily. Stool softener      .  Heparin Lock Flush (HEPARIN FLUSH) 10 UNIT/ML injection 4,000 Units once.      . pantoprazole (PROTONIX) 40 MG tablet Take 1 tablet (40 mg total) by mouth 2 (two) times daily before a meal.  60 tablet  2   No current facility-administered medications on file prior to visit.    No Known Allergies   REVIEW OF SYSTEMS:  (Positives checked otherwise negative)  CARDIOVASCULAR:  [] chest pain, [] chest pressure, [] palpitations, [] shortness of breath when laying flat, [] shortness of breath with exertion,  [x] pain in feet when walking, [x] pain in feet when laying flat, [] history of blood clot in veins (DVT), [] history of phlebitis, [] swelling in legs, [] varicose veins  PULMONARY:  [] productive cough, [] asthma, [] wheezing  NEUROLOGIC:  [x] weakness in arms or  legs, [] numbness in arms or legs, [] difficulty speaking or slurred speech, [] temporary loss of vision in one eye, [] dizziness  HEMATOLOGIC:  [] bleeding problems, [] problems with blood clotting too easily  MUSCULOSKEL:  [] joint pain, [] joint swelling  GASTROINTEST:  [] vomiting blood, [] blood in stool     GENITOURINARY:  [] burning with urination, [] blood in urine  PSYCHIATRIC:  [] history of major depression  INTEGUMENTARY:  [] rashes, [x] ulcers  CONSTITUTIONAL:  [] fever, [] chills  For VQI Use Only  PRE-ADM LIVING: Nursing home  AMB STATUS: Ambulatory  CAD Sx: None  PRIOR CHF: None  STRESS TEST: [x] No, [ ] Normal, [ ] + ischemia, [ ] + MI, [ ] Both   Physical Examination  Filed Vitals:   06/30/14 1510  BP: 127/77  Pulse: 78  Resp: 16  Height: 5' (1.524 m)  Weight: 95 lb 8 oz (43.319 kg)  SpO2: 98%   Body mass index is 18.65 kg/(m^2).  General: A&O x 3, WD, elderly, emaciated   Head: [place]/AT, Temporalis wasting,   Ear/Nose/Throat: Hearing grossly intact, nares w/o erythema or drainage, oropharynx w/o Erythema/Exudate, Mallampati score: 2  Eyes: PERRLA, EOMI  Neck: Supple, no nuchal rigidity, no palpable LAD  Pulmonary: Sym exp, good air movt, CTAB, no rales, rhonchi, & wheezing  Cardiac: RRR, with extrabeats, Nl S1, S2, + Murmur, splitting sound at MV listening area  Vascular: Vessel Right Left  Radial Palpable Palpable  Brachial Palpable Palpable  Carotid Palpable, without bruit Palpable, without bruit  Aorta Not palpable N/A  Femoral Palpable Palpable  Popliteal Not palpable Not palpable  PT Palpable Palpable  DP Palpable Palpable   Gastrointestinal: soft, NTND, -G/R, - HSM, - masses, - CVAT B  Musculoskeletal: M/S 5/5 throughout including DF/PF in R foot, muscle wasting in all extremities, Extremities without ischemic changes except  R foot with dependent rubor and two small areas of ischemic skin over 1st MT and great  toe  Neurologic: CN 2-12 intact , Pain and light touch intact in extremities , Motor exam as listed above  Psychiatric: Judgment intact, Mood & affect appropriate for pt's clinical situation  Dermatologic: See M/S exam for extremity exam, no rashes otherwise noted  Lymph : No Cervical, Axillary, or Inguinal lymphadenopathy   Non-Invasive Vascular Imaging Outside ABI (Date: 06/12/14)  R: 0.08  L: 0.83  BLE arterial duplex (06/30/2014)   R: monophasic flow throughout, likely R iliac occlusion, only peroneal visualized distally  L: Triphasic through except AT monophasic  Outside Studies/Documentation 15 pages of outside documents were reviewed including: outpatient PCP charts, neurosurgery charts,  and outside ABI.   Medical Decision Making  Mary [name] is a 80 y.o. female who presents with: RLE critical limb ischemia, mild-mod LLE PAD   The patient has intact motor and sensation, so her likely R iliac occlusion is likely chronic.  She has no evidence of threatened limb, but she might already have early onset of dry gangrene in the R foot.  I discussed with the patient the natural history of critical limb ischemia: 25% require amputation in one year, 50% are able to maintain their limbs in one year, and 25-30% die in one year due to comorbidities.  Given the limb threatening status of this patient, I recommend an aggressive work up including proceeding with an: Aortogram, Bilateral runoff and intervention R leg I discussed with the patient the nature of angiographic procedures, especially the limited patencies of any endovascular intervention. The patient is aware of that the risks of an angiographic procedure include but are not limited to: bleeding, infection, access site complications, embolization, rupture of treated vessel, dissection, possible need for emergent surgical intervention, and possible need for surgical procedures to treat the patient's pathology. The patient is  aware of the risks and agrees to proceed.  The procedure is scheduled for: 24 AUG 15.  I discussed in depth with the patient the nature of atherosclerosis, and emphasized the importance of maximal medical management including strict control of blood pressure, blood glucose, and lipid levels, antiplatelet agents, obtaining regular exercise, and cessation of smoking.  The patient is aware that without maximal medical management the underlying atherosclerotic disease process will progress, limiting the benefit of any interventions. The patient is currently not on a statin or anti-platelet. This will be addressed post-procedure.  Thank you for allowing [place] to participate in this patient's care.  Adele Barthel, MD [facility] Office: [phone] Pager: [phone]  06/30/2014, 3:32 PM

## 2014-07-03 NOTE — Op Note (Signed)
OPERATIVE NOTE   PROCEDURE: 1.  Bilateral common femoral artery cannulation under ultrasound guidance 2.  Placement catheter in aorta 3.  Aortogram 4.  Right leg runoff  PRE-OPERATIVE DIAGNOSIS: Right foot critical limb ischemia   POST-OPERATIVE DIAGNOSIS: same as above   SURGEON: Adele Barthel, MD  ANESTHESIA: conscious sedation  ESTIMATED BLOOD LOSS: 30 cc  CONTRAST: 60 cc  FINDING(S):  Aorta: Patent, diffusely diseased with diffuse calcification, <30% stenoses x 2  Superior mesenteric artery: not seen Celiac artery: Patent   Right Left  RA Patent with nephrogram Patent without nephrogram  CIA Occluded shortly after bifurcation Patent  EIA Patent with small lumen ~3 mm Patent  IIA Occluded Patent  CFA Patent and small, near occluding stenosis present, extensive collaterals around common femoral artery  Patent  SFA Patent, miniscule lumen, long proximal stenosis <30%   PFA Patent, miniscule lumen   Pop Patent, miniscule lumen   Trif Patent, miniscule tibioperoneal trunk    AT Occluded   Pero Patent, faintly evident distal runoff   PT Occluded    SPECIMEN(S):  none  INDICATIONS:   Mary [name] is a 80 y.o. female who presents with early right dry gangrene.  The patient presents for: aortogram, right leg runoff, and possible intervention.  I discussed with the patient the nature of angiographic procedures, especially the limited patencies of any endovascular intervention.  The patient is aware of that the risks of an angiographic procedure include but are not limited to: bleeding, infection, access site complications, renal failure, embolization, rupture of vessel, dissection, possible need for emergent surgical intervention, possible need for surgical procedures to treat the patient's pathology, and stroke and death.  The patient is aware of the risks and agrees to proceed.  DESCRIPTION: After full informed consent was obtained from the patient, the patient was  brought back to the angiography suite.  The patient was placed supine upon the angiography table and connected to monitoring equipment.  The patient was then given conscious sedation, the amounts of which are documented in the patient's chart.  The patient was prepped and drape in the standard fashion for an angiographic procedure.  At this point, attention was turned to the left groin.  Under ultrasound guidance, the left common femoral artery was cannulated with a micropuncture needle.  The microwire was advanced into the iliac arterial system.  The needle was exchanged for a microsheath, which was loaded into the common femoral artery over the wire.  The microwire was exchanged for a [facility] wire which was advanced into the left common iliac artery.  The microsheath was then exchanged for a 5-Fr sheath which was loaded into the common femoral artery.  The Britta Mccreedy was advanced into the aorta with some difficulty.  The Omniflush catheter was then loaded over the wire up to the level of L1.  The catheter was connected to the power injector circuit.  After de-airring and de-clotting the circuit, a power injector aortogram was completed.  The findings are listed above.  I felt that an attempt at intervention was indicated.  Under ultrasound guidance, the right common femoral artery was cannulated with a micropuncture needle.  The microwire was advanced into the iliac arterial system.  The needle was exchanged for a microsheath, which was loaded into the common femoral artery over the wire.  The microwire was exchanged for a [facility] wire which was advanced into the aorta.  The microsheath was then exchanged for a 5-Fr sheath which was loaded into  the common femoral artery.  I did a hand injection which demonstrated the right common iliac artery was calcified and occluded.  I placed a Glidewire through the sheath and then placed an end-hole catheter over the wire.  Using these two, I tried to cross the chronic total  occlusion of the right common iliac artery, but I was unable to cross this occlusion.    The right sheath was aspirated.  No clots were present and the sheath was reloaded with heparinized saline.  The sheath was connected to the power injector circuit.  An automated right leg runoff was completed.  The findings are listed above.    Based on the images, this patient needs a right femoral artery endarterectomy with bovine patch angioplasty with left-to-right femorofemoral bypass vs. Conservative mangement.  With common femoral artery compromise, I suspect even an above-knee amputation would have difficulties healing.  COMPLICATIONS: none  CONDITION: stable  Adele Barthel, MD [facility] Office: [phone] Pager: [phone]  07/03/2014, 1:36 PM

## 2014-07-03 NOTE — Progress Notes (Signed)
NO DOPPLER PULSE RIGHT LEG;ABBIE,RN FROM CATH LAB IN AND CHECKED WITH ME; DR FIELDS NOTIFIED AND CLIENT'S FAMILY WANTS CLIENT ADMITTED FOR PAIN CONTROL; RIGHT FOOT MOTTLED

## 2014-07-03 NOTE — Progress Notes (Signed)
PAGED DR FIELDS AGAIN AND ORDER RECEIVED FOR PAIN MED

## 2014-07-03 NOTE — Progress Notes (Signed)
PTs potassium was 2.9 according to her I stat 8.  Dr Bridgett Larsson was notified and orders was given for 4meq of [place].  Potassium was given per MD order.  PT tolerated well.  Will continue to monitor closely

## 2014-07-03 NOTE — Progress Notes (Signed)
PAGED DR FIELDS AFTER CALLING OFFICE AND NO ANSWER FROM DR Bridgett Larsson AND PER DR FIELDS WILL CALL DR Epimenio Foot;

## 2014-07-03 NOTE — Progress Notes (Signed)
REPORT CALLED TO RN FOR 3-W-39

## 2014-07-03 NOTE — Progress Notes (Addendum)
   I had a long discussion with this patient's family.  Reportedly this patient when to OSH yesterday with rest pain that requiring IV narcotics for management.  Pt is interested in revascularization due to severe right leg pain.   - will obtain ASAP cardiology optimization.  I would not postpone the procedure for cardiology optimization - will aim for 2 SEP 15 R femoral endarterectomy with bovine patch angioplasty and Left-to-right femorofemoral bypass - to try to limit her risk, I will have Dr. Kellie Simmering involved to expedite the procedure with two surgeons - The risk, benefits, and alternative for bypass operations were discussed with the patient.  The patient is aware the risks include but are not limited to: bleeding, infection, myocardial infarction, stroke, limb loss, nerve damage, need for additional procedures in the future, wound complications, and inability to complete the bypass.   - The family is aware the patient is at increased risk for mortality.  The patient is aware of these risks and agreed to proceed. - In regards to lack of R PT signal, she had an ABI of 0 as an outpatient so this is unchanged.  Adele Barthel, MD [facility] Office: [phone] Pager: [phone]  07/03/2014, 2:17 PM

## 2014-07-03 NOTE — Discharge Instructions (Signed)
Angiogram, Care After Refer to this sheet in the next few weeks. These instructions provide you with information on caring for yourself after your procedure. Your health care provider may also give you more specific instructions. Your treatment has been planned according to current medical practices, but problems sometimes occur. Call your health care provider if you have any problems or questions after your procedure.  WHAT TO EXPECT AFTER THE PROCEDURE After your procedure, it is typical to have the following sensations:  Minor discomfort or tenderness and a small bump at the catheter insertion site. The bump should usually decrease in size and tenderness within 1 to 2 weeks.  Any bruising will usually fade within 2 to 4 weeks. HOME CARE INSTRUCTIONS   You may need to keep taking blood thinners if they were prescribed for you. Take medicines only as directed by your health care provider.  Do not apply powder or lotion to the site.  Do not take baths, swim, or use a hot tub until your health care provider approves.  You may shower 24 hours after the procedure. Remove the bandage (dressing) and gently wash the site with plain soap and water. Gently pat the site dry.  Inspect the site at least twice daily.  Limit your activity for the first 48 hours. Do not bend, squat, or lift anything over 10 lb (9 kg) or as directed by your health care provider.  Plan to have someone take you home after the procedure. Follow instructions about when you can drive or return to work. SEEK MEDICAL CARE IF:  You get light-headed when standing up.  You have drainage (other than a small amount of blood on the dressing).  You have chills.  You have a fever.  You have redness, warmth, swelling, or pain at the insertion site. SEEK IMMEDIATE MEDICAL CARE IF:   You develop chest pain or shortness of breath, feel faint, or pass out.  You have bleeding, swelling larger than a walnut, or drainage from the  catheter insertion site.  You develop pain, discoloration, coldness, or severe bruising in the leg or arm that held the catheter.  You develop bleeding from any other place, such as the bowels. You may see bright red blood in your urine or stools, or your stools may appear black and tarry.  You have heavy bleeding from the site. If this happens, hold pressure on the site. MAKE SURE YOU:  Understand these instructions.  Will watch your condition.  Will get help right away if you are not doing well or get worse. Document Released: 05/15/2005 Document Revised: 03/13/2014 Document Reviewed: 03/21/2013 [facility] Patient Information 2015 [place], [place]. This information is not intended to replace advice given to you by your health care provider. Make sure you discuss any questions you have with your health care provider.

## 2014-07-03 NOTE — Progress Notes (Addendum)
Site area: right groin Site Prior to Removal:  Level 0 Pressure Applied For: 20 minutes Manual:   yes Patient Status During Pull:  Stable Post Pull Site:  Level 0 Post Pull Instructions Given:  yes Post Pull Pulses Present:  no pusles in right foot pre or post sheath pull; Dr Bridgett Larsson aware Dressing Applied:  tegaderm Bedrest begins @ 14:15:00 Comments:no complications   Left groin sheath pulled by Antony Salmon, RN with complication. Left Pt pulses remained  present with doppler.

## 2014-07-04 ENCOUNTER — Telehealth: Payer: Self-pay | Admitting: Vascular Surgery

## 2014-07-04 ENCOUNTER — Encounter (HOSPITAL_COMMUNITY): Payer: Self-pay | Admitting: Cardiology

## 2014-07-04 DIAGNOSIS — Z87891 Personal history of nicotine dependence: Secondary | ICD-10-CM

## 2014-07-04 DIAGNOSIS — Z01810 Encounter for preprocedural cardiovascular examination: Secondary | ICD-10-CM

## 2014-07-04 DIAGNOSIS — Z681 Body mass index (BMI) 19 or less, adult: Secondary | ICD-10-CM | POA: Diagnosis not present

## 2014-07-04 DIAGNOSIS — E785 Hyperlipidemia, unspecified: Secondary | ICD-10-CM | POA: Diagnosis present

## 2014-07-04 DIAGNOSIS — I447 Left bundle-branch block, unspecified: Secondary | ICD-10-CM | POA: Diagnosis not present

## 2014-07-04 DIAGNOSIS — Z5189 Encounter for other specified aftercare: Secondary | ICD-10-CM | POA: Diagnosis not present

## 2014-07-04 DIAGNOSIS — R293 Abnormal posture: Secondary | ICD-10-CM | POA: Diagnosis not present

## 2014-07-04 DIAGNOSIS — I498 Other specified cardiac arrhythmias: Secondary | ICD-10-CM | POA: Diagnosis not present

## 2014-07-04 DIAGNOSIS — I70209 Unspecified atherosclerosis of native arteries of extremities, unspecified extremity: Secondary | ICD-10-CM | POA: Diagnosis present

## 2014-07-04 DIAGNOSIS — Z48812 Encounter for surgical aftercare following surgery on the circulatory system: Secondary | ICD-10-CM | POA: Diagnosis not present

## 2014-07-04 DIAGNOSIS — I708 Atherosclerosis of other arteries: Secondary | ICD-10-CM | POA: Diagnosis present

## 2014-07-04 DIAGNOSIS — I129 Hypertensive chronic kidney disease with stage 1 through stage 4 chronic kidney disease, or unspecified chronic kidney disease: Secondary | ICD-10-CM | POA: Diagnosis present

## 2014-07-04 DIAGNOSIS — L97509 Non-pressure chronic ulcer of other part of unspecified foot with unspecified severity: Secondary | ICD-10-CM | POA: Diagnosis not present

## 2014-07-04 DIAGNOSIS — M629 Disorder of muscle, unspecified: Secondary | ICD-10-CM | POA: Diagnosis not present

## 2014-07-04 DIAGNOSIS — E876 Hypokalemia: Secondary | ICD-10-CM | POA: Diagnosis present

## 2014-07-04 DIAGNOSIS — N183 Chronic kidney disease, stage 3 unspecified: Secondary | ICD-10-CM

## 2014-07-04 DIAGNOSIS — R627 Adult failure to thrive: Secondary | ICD-10-CM | POA: Diagnosis present

## 2014-07-04 DIAGNOSIS — Z09 Encounter for follow-up examination after completed treatment for conditions other than malignant neoplasm: Secondary | ICD-10-CM | POA: Diagnosis not present

## 2014-07-04 DIAGNOSIS — I739 Peripheral vascular disease, unspecified: Secondary | ICD-10-CM | POA: Diagnosis not present

## 2014-07-04 DIAGNOSIS — E871 Hypo-osmolality and hyponatremia: Secondary | ICD-10-CM

## 2014-07-04 DIAGNOSIS — R636 Underweight: Secondary | ICD-10-CM | POA: Diagnosis present

## 2014-07-04 DIAGNOSIS — I454 Nonspecific intraventricular block: Secondary | ICD-10-CM | POA: Diagnosis present

## 2014-07-04 DIAGNOSIS — I70269 Atherosclerosis of native arteries of extremities with gangrene, unspecified extremity: Secondary | ICD-10-CM | POA: Diagnosis not present

## 2014-07-04 DIAGNOSIS — I70229 Atherosclerosis of native arteries of extremities with rest pain, unspecified extremity: Secondary | ICD-10-CM | POA: Diagnosis not present

## 2014-07-04 DIAGNOSIS — D72829 Elevated white blood cell count, unspecified: Secondary | ICD-10-CM | POA: Diagnosis not present

## 2014-07-04 DIAGNOSIS — M6281 Muscle weakness (generalized): Secondary | ICD-10-CM | POA: Diagnosis not present

## 2014-07-04 DIAGNOSIS — I4510 Unspecified right bundle-branch block: Secondary | ICD-10-CM | POA: Diagnosis not present

## 2014-07-04 DIAGNOSIS — I4439 Other atrioventricular block: Secondary | ICD-10-CM

## 2014-07-04 DIAGNOSIS — Z85038 Personal history of other malignant neoplasm of large intestine: Secondary | ICD-10-CM | POA: Diagnosis not present

## 2014-07-04 DIAGNOSIS — M129 Arthropathy, unspecified: Secondary | ICD-10-CM | POA: Diagnosis not present

## 2014-07-04 DIAGNOSIS — E43 Unspecified severe protein-calorie malnutrition: Secondary | ICD-10-CM | POA: Diagnosis not present

## 2014-07-04 DIAGNOSIS — R64 Cachexia: Secondary | ICD-10-CM | POA: Diagnosis present

## 2014-07-04 DIAGNOSIS — M1990 Unspecified osteoarthritis, unspecified site: Secondary | ICD-10-CM | POA: Diagnosis not present

## 2014-07-04 DIAGNOSIS — I10 Essential (primary) hypertension: Secondary | ICD-10-CM

## 2014-07-04 DIAGNOSIS — M79609 Pain in unspecified limb: Secondary | ICD-10-CM | POA: Diagnosis present

## 2014-07-04 DIAGNOSIS — L98499 Non-pressure chronic ulcer of skin of other sites with unspecified severity: Secondary | ICD-10-CM

## 2014-07-04 DIAGNOSIS — M810 Age-related osteoporosis without current pathological fracture: Secondary | ICD-10-CM | POA: Diagnosis present

## 2014-07-04 DIAGNOSIS — R5381 Other malaise: Secondary | ICD-10-CM | POA: Diagnosis present

## 2014-07-04 DIAGNOSIS — D649 Anemia, unspecified: Secondary | ICD-10-CM | POA: Diagnosis not present

## 2014-07-04 DIAGNOSIS — Z9889 Other specified postprocedural states: Secondary | ICD-10-CM | POA: Diagnosis not present

## 2014-07-04 DIAGNOSIS — Z79899 Other long term (current) drug therapy: Secondary | ICD-10-CM | POA: Diagnosis not present

## 2014-07-04 HISTORY — DX: Nonspecific intraventricular block: I45.4

## 2014-07-04 LAB — BASIC METABOLIC PANEL
ANION GAP: 12 (ref 5–15)
BUN: 23 mg/dL (ref 6–23)
CO2: 25 mEq/L (ref 19–32)
Calcium: 8.7 mg/dL (ref 8.4–10.5)
Chloride: 100 mEq/L (ref 96–112)
Creatinine, Ser: 0.85 mg/dL (ref 0.50–1.10)
GFR calc non Af Amer: 59 mL/min — ABNORMAL LOW (ref >90)
GFR, EST AFRICAN AMERICAN: 69 mL/min — AB (ref >90)
Glucose, Bld: 92 mg/dL (ref 70–99)
POTASSIUM: 3.8 meq/L (ref 3.7–5.3)
Sodium: 137 mEq/L (ref 137–147)

## 2014-07-04 MED ORDER — SODIUM CHLORIDE 0.9 % IV SOLN
INTRAVENOUS | Status: DC
  Administered 2014-07-04 – 2014-07-05 (×3): via INTRAVENOUS

## 2014-07-04 MED ORDER — METOPROLOL SUCCINATE ER 50 MG PO TB24
50.0000 mg | ORAL_TABLET | Freq: Every day | ORAL | Status: DC
  Administered 2014-07-04: 50 mg via ORAL
  Filled 2014-07-04 (×2): qty 1

## 2014-07-04 MED ORDER — OXYCODONE-ACETAMINOPHEN 5-325 MG PO TABS
1.0000 | ORAL_TABLET | Freq: Every day | ORAL | Status: DC
  Administered 2014-07-04 (×4): 1 via ORAL
  Administered 2014-07-05: 2 via ORAL
  Filled 2014-07-04: qty 1
  Filled 2014-07-04: qty 2
  Filled 2014-07-04: qty 1
  Filled 2014-07-04: qty 2
  Filled 2014-07-04 (×2): qty 1

## 2014-07-04 MED ORDER — CHLORHEXIDINE GLUCONATE CLOTH 2 % EX PADS
6.0000 | MEDICATED_PAD | Freq: Once | CUTANEOUS | Status: AC
  Administered 2014-07-05: 6 via TOPICAL

## 2014-07-04 MED ORDER — POTASSIUM CHLORIDE CRYS ER 20 MEQ PO TBCR
40.0000 meq | EXTENDED_RELEASE_TABLET | Freq: Two times a day (BID) | ORAL | Status: AC
  Administered 2014-07-04 (×2): 40 meq via ORAL
  Filled 2014-07-04 (×2): qty 2

## 2014-07-04 MED ORDER — DEXTROSE 5 % IV SOLN
1.5000 g | INTRAVENOUS | Status: DC
  Filled 2014-07-04: qty 1.5

## 2014-07-04 MED ORDER — CHLORHEXIDINE GLUCONATE CLOTH 2 % EX PADS
6.0000 | MEDICATED_PAD | Freq: Once | CUTANEOUS | Status: AC
  Administered 2014-07-04: 6 via TOPICAL

## 2014-07-04 MED ORDER — LOSARTAN POTASSIUM-HCTZ 100-25 MG PO TABS: 1.0000 | ORAL_TABLET | Freq: Every day | ORAL | Status: DC

## 2014-07-04 MED ORDER — HYDROCHLOROTHIAZIDE 25 MG PO TABS
25.0000 mg | ORAL_TABLET | Freq: Every day | ORAL | Status: DC
  Filled 2014-07-04: qty 1

## 2014-07-04 MED ORDER — ENSURE COMPLETE PO LIQD
237.0000 mL | Freq: Two times a day (BID) | ORAL | Status: DC
  Administered 2014-07-04: 237 mL via ORAL

## 2014-07-04 MED ORDER — SODIUM CHLORIDE 0.9 % IV SOLN
INTRAVENOUS | Status: DC
  Administered 2014-07-04: 10:00:00 via INTRAVENOUS

## 2014-07-04 MED ORDER — LOSARTAN POTASSIUM 50 MG PO TABS
100.0000 mg | ORAL_TABLET | Freq: Every day | ORAL | Status: DC
  Administered 2014-07-04: 100 mg via ORAL
  Filled 2014-07-04 (×2): qty 2

## 2014-07-04 MED ORDER — CEFAZOLIN SODIUM-DEXTROSE 2-3 GM-% IV SOLR
2.0000 g | INTRAVENOUS | Status: AC
  Administered 2014-07-05: 2 g via INTRAVENOUS
  Filled 2014-07-04: qty 50

## 2014-07-04 NOTE — Consult Note (Signed)
Admit date: 07/03/2014 Referring Physician  Dr. Bridgett Larsson Primary Physician Gar Ponto, MD Primary Cardiologist  None Reason for Consultation  preoperative risk stratification  HPI: 80 year-old female with severe peripheral vascular disease with prior history of hypertension and former smoker, no prior cardiovascular history other than left bundle branch block (possibly rate related based upon note on 03/18/12) awaiting right leg urgent vascular surgery.  In review of Dr. Lianne Moris history and physical she has been having worsening symptoms since April described as sharp, crampy, moderate to severe in intensity associated with short distance ambulation and occasionally at rest.  She has been having rest pain symptoms and ischemic areas on the right foot.  She has been treated with Hyzaar and diltiazem for her hypertension. She also takes red yeast rice for hyperlipidemia. Consider statin placement.  She underwent angiogram which demonstrates high-grade stenosis, right leg in need of urgent right common femoral artery endarterectomy, left to right femoral/femoral bypass graft.  Her risk were discussed with her including myocardial infarction with mortality of 10%. The patient is willing to proceed.  Her potassium this morning is 2.9.  She denies any chest pain, angina, valvular abnormalities, syncope.  Her EKG now shows sinus rhythm with borderline LVH.    PMH:   Past Medical History  Diagnosis Date  . Hypertension   . Osteoporosis   . Pneumonia   . Anemia   . Arthritis   . Cancer     colon cancer  . Peripheral vascular disease     PSH:   Past Surgical History  Procedure Laterality Date  . Cholecystectomy    . Colon surgery      colon cancer  . Tubal ligation    . Colonoscopy  03/19/2012    Procedure: COLONOSCOPY;  Surgeon: Jerene Bears, MD;  Location: [place];  Service: Gastroenterology;  Laterality: N/A;  . Esophagogastroduodenoscopy  03/19/2012    Procedure:  ESOPHAGOGASTRODUODENOSCOPY (EGD);  Surgeon: Jerene Bears, MD;  Location: [place];  Service: Gastroenterology;  Laterality: N/A;  . Esophagogastroduodenoscopy  03/20/2012    Procedure: ESOPHAGOGASTRODUODENOSCOPY (EGD);  Surgeon: Jerene Bears, MD;  Location: [place];  Service: Gastroenterology;  Laterality: N/A;  . Colonoscopy  03/20/2012    Procedure: COLONOSCOPY;  Surgeon: Jerene Bears, MD;  Location: [place];  Service: Gastroenterology;  Laterality: N/A;  . Spine surgery  06-19-14   Allergies:  Review of patient's allergies indicates no known allergies. Prior to Admit Meds:   Prior to Admission medications   Medication Sig Start Date End Date Taking? Authorizing Provider  Calcium Carbonate-Vitamin D (CALCIUM + D PO) Take 1 tablet by mouth 2 (two) times daily.   Yes Historical Provider, MD  HYDROcodone-acetaminophen (NORCO/VICODIN) 5-325 MG per tablet Take 1 tablet by mouth every 6 (six) hours as needed for moderate pain.   Yes Historical Provider, MD  losartan-hydrochlorothiazide (HYZAAR) 100-25 MG per tablet Take 1 tablet by mouth daily.   Yes Historical Provider, MD  metoprolol succinate (TOPROL-XL) 50 MG 24 hr tablet Take 50 mg by mouth daily. Take with or immediately following a meal.   Yes Historical Provider, MD  Red Yeast Rice 600 MG TABS Take 1 tablet by mouth daily.   Yes Historical Provider, MD   Fam HX:    Family History  Problem Relation Age of Onset  . Stroke Mother   . Hypertension Sister   . Cancer Brother   . Heart attack Brother   . Cancer Daughter   .  Hyperlipidemia Daughter   . Cancer Son   . Cancer Daughter   . Cancer Son    Social HX:    History   Social History  . Marital Status: Widowed    Spouse Name: N/A    Number of Children: N/A  . Years of Education: N/A   Occupational History  . Not on file.   Social History Main Topics  . Smoking status: Former Smoker    Quit date: 03/19/1979  . Smokeless tobacco: Never Used  . Alcohol Use: No    . Drug Use: No  . Sexual Activity: Not Currently   Other Topics Concern  . Not on file   Social History Narrative  . No narrative on file     ROS:  All 11 ROS were addressed and are negative except what is stated in the HPI   Physical Exam: Blood pressure 119/57, pulse 62, temperature 98.6 F (37 C), temperature source Oral, resp. rate 18, height 5\' 1"  (1.549 m), weight 97 lb 1.6 oz (44.044 kg), SpO2 99.00%.   General: Elderly, in no acute distress Head: Eyes PERRLA, No xanthomas.   Normal cephalic and atramatic  Lungs:   Clear bilaterally to auscultation and percussion. Normal respiratory effort. No wheezes, no rales. Overall decreased air movement bilaterally.  Heart:   HRRR S1 S2 Pulses are 2+ & equal. Soft systolic LLSB murmur, rubs, gallops.  No carotid bruit. No JVD.  No abdominal bruits.  Abdomen: Bowel sounds are positive, abdomen soft and non-tender without masses. No hepatosplenomegaly. Thin.  Msk:  Back normal.  Extremities:  No clubbing, cyanosis or edema.  No right lower extremity pulses Neuro: Alert and oriented X 3, non-focal, MAE x 4 GU: Deferred Rectal: Deferred Psych:  Good affect, responds appropriately      Labs: Lab Results  Component Value Date   WBC 11.8* 03/23/2012   HGB 12.9 07/03/2014   HCT 38.0 07/03/2014   MCV 88.1 03/23/2012   PLT 305 03/23/2012     Recent Labs Lab 07/03/14 1123  NA 129*  K 2.9*  CL 108  BUN 33*  CREATININE 1.50*  GLUCOSE 114*    EKG:  Sinus rhythm, borderline LVH. Prior EKG from 2013 demonstrated rate related left bundle branch block. Personally viewed.   TELE: Rate related bundle branch block.   ASSESSMENT/PLAN:    80 year old female with hypertension, former smoker, with severe peripheral vascular disease needing to undergo urgent vascular procedure.  1. Preoperative risk stratification- hypokalemia is being repleted. She currently has an order for potassium supplementation. She does not have any cardiac  contraindications clinically to surgery (no evidence of current myocardial infarction, no acute heart failure, no severe valvular abnormalities, no high risk arrhythmias). She may proceed with surgery with moderate overall cardiovascular risk. Based upon her symptoms and possible loss of limb, surgery is viewed as urgent and by expert opinion of Dr. Bridgett Larsson, she should proceed. Please see his note for full details. No further cardiac testing at this time. Based on exam, no evidence of severe aortic stenosis. I had lengthy discussion with family.   2. Hypokalemia-currently being repleted. This will help to avoid adverse arrhythmias.  3. Possible chronic kidney disease stage III-prior creatinine was 0.73 on 03/23/12, currently 1.5. Monitor for signs of acute kidney injury postoperatively. Avoid NSAIDs. Recent IV dye administration noted.  4. Hyponatremia-previously normal serum sodium. With her elevated creatinine, may need gentle hydration. Monitor. HCTZ may be precipitating this as well. I will stop.  5. Severe peripheral vascular disease-would recommend statin therapy. She has tried in the past but developed myalgias. On red yeast rice currently. May try pravastatin post op.   6. Malnutrition - current weight is 97 pounds. She is at increased risk with her decreased BMI.  Please call us if further assistance is needed.   Candee Furbish, MD  07/04/2014  8:51 AM

## 2014-07-04 NOTE — Progress Notes (Signed)
INITIAL NUTRITION ASSESSMENT  DOCUMENTATION CODES Per approved criteria  -Severe malnutrition in the context of chronic illness -Underweight   INTERVENTION:  Ensure Complete PO BID, each supplement provides 350 kcal and 13 grams of protein  NUTRITION DIAGNOSIS: Increased nutrient needs related to foot wound as evidenced by estimated calorie and protein needs.   Goal: Intake to meet >90% of estimated nutrition needs.  Monitor:  PO intake, labs, weight trend.  Reason for Assessment: MST  80 y.o. female  Admitting Dx: Right leg pain/ischemia; PVD  ASSESSMENT: Patient presented with right leg and foot pain, worsening since April. S/P abdominal aortogram on 8/24.  Patient reports that she ate "just about all" of her lunch today. She has noticed some weight loss, but reports that she has been eating well. Likes Ensure.  Nutrition Focused Physical Exam:  Subcutaneous Fat:  Orbital Region: severe depletion Upper Arm Region: mild depletion Thoracic and Lumbar Region: mild depletion  Muscle:  Temple Region: mild depletion Clavicle Bone Region: severe depletion Clavicle and Acromion Bone Region: severe depletion Scapular Bone Region: mild depletion Dorsal Hand: mild depletion Patellar Region: severe depletion Anterior Thigh Region: severe depletion Posterior Calf Region: severe depletion  Edema: none  Pt meets criteria for severe MALNUTRITION in the context of chronic illness as evidenced by severe depletion of muscle and subcutaneous fat mass.   Height: Ht Readings from Last 1 Encounters:  07/03/14 5\' 1"  (1.549 m)    Weight: Wt Readings from Last 1 Encounters:  07/04/14 97 lb 1.6 oz (44.044 kg)    Ideal Body Weight: 47.7 kg  % Ideal Body Weight: 92%  Wt Readings from Last 10 Encounters:  07/04/14 97 lb 1.6 oz (44.044 kg)  07/04/14 97 lb 1.6 oz (44.044 kg)  06/30/14 95 lb 8 oz (43.319 kg)  03/22/12 103 lb 9.9 oz (47 kg)  03/22/12 103 lb 9.9 oz (47 kg)   03/22/12 103 lb 9.9 oz (47 kg)  03/22/12 103 lb 9.9 oz (47 kg)    Usual Body Weight: patient unsure  % Usual Body Weight: N/A  BMI:  Body mass index is 18.36 kg/(m^2). Underweight  Estimated Nutritional Needs: Kcal: 1300-1500 Protein: 65-80 gm Fluid: 1.3 L  Skin: back incision, right foot ischemia  Diet Order: Cardiac  EDUCATION NEEDS: -Education needs addressed   Intake/Output Summary (Last 24 hours) at 07/04/14 0924 Last data filed at 07/04/14 5784  Gross per 24 hour  Intake 1220.02 ml  Output      0 ml  Net 1220.02 ml    Last BM: 8/23   Labs:   Recent Labs Lab 07/03/14 1123  NA 129*  K 2.9*  CL 108  BUN 33*  CREATININE 1.50*  GLUCOSE 114*    CBG (last 3)  No results found for this basename: GLUCAP,  in the last 72 hours  Scheduled Meds: . [START ON 07/05/2014]  ceFAZolin (ANCEF) IV  2 g Intravenous On Call  . losartan  100 mg Oral Daily  . metoprolol succinate  50 mg Oral Daily  . oxyCODONE-acetaminophen  1-2 tablet Oral 6 X Daily    Continuous Infusions: . sodium chloride 1 mL/kg/hr (07/04/14 0615)    Past Medical History  Diagnosis Date  . Hypertension   . Osteoporosis   . Pneumonia   . Anemia   . Arthritis   . Cancer     colon cancer  . Peripheral vascular disease     Past Surgical History  Procedure Laterality Date  . Cholecystectomy    .  Colon surgery      colon cancer  . Tubal ligation    . Colonoscopy  03/19/2012    Procedure: COLONOSCOPY;  Surgeon: Jerene Bears, MD;  Location: [place];  Service: Gastroenterology;  Laterality: N/A;  . Esophagogastroduodenoscopy  03/19/2012    Procedure: ESOPHAGOGASTRODUODENOSCOPY (EGD);  Surgeon: Jerene Bears, MD;  Location: [place];  Service: Gastroenterology;  Laterality: N/A;  . Esophagogastroduodenoscopy  03/20/2012    Procedure: ESOPHAGOGASTRODUODENOSCOPY (EGD);  Surgeon: Jerene Bears, MD;  Location: [place];  Service: Gastroenterology;  Laterality: N/A;  . Colonoscopy   03/20/2012    Procedure: COLONOSCOPY;  Surgeon: Jerene Bears, MD;  Location: [place];  Service: Gastroenterology;  Laterality: N/A;  . Spine surgery  06-19-14     Molli Barrows, [place], [place], [place] Pager [phone] After Hours Pager [phone]

## 2014-07-04 NOTE — Progress Notes (Addendum)
Subjective  - My foot is hurting so much that I only slept for 1 hour last night.  I'm ready to have surgery.   Objective 119/57 62 98.6 F (37 C) (Oral) 18 99%  Intake/Output Summary (Last 24 hours) at 07/04/14 0735 Last data filed at 07/04/14 4431  Gross per 24 hour  Intake 1220.02 ml  Output      0 ml  Net 1220.02 ml    Active range of motion of the right foot, sensation grossly intact. Right foot with dusky discoloration 2 small areas ulcers 1st MTP  Left groin soft without hematoma  Assessment/Planning: POD #1 PROCEDURE:  1. Bilateral common femoral artery cannulation under ultrasound guidance  2. Placement catheter in aorta  3. Aortogram  4. Right leg runoff  Right foot critical limb ischemia   Based on the images of the angiography yesterday, this patient needs a right femoral artery endarterectomy with bovine patch angioplasty with left-to-right femorofemoral bypass.   In regards to lack of R PT signal, she had an ABI of 0 as an outpatient so this is unchanged.  We will obtain ASAP cardiology optimization. I would not postpone the procedure for cardiology optimization   She reports that her pain is sever and wants to proceed with surgery as soon as possible.  [name] 07/04/2014 7:35 AM --  Laboratory Lab Results:  Recent Labs  07/03/14 1123  HGB 12.9  HCT 38.0   BMET  Recent Labs  07/03/14 1123  NA 129*  K 2.9*  CL 108  GLUCOSE 114*  BUN 33*  CREATININE 1.50*    COAG Lab Results  Component Value Date   INR 1.08 03/18/2012   No results found for this basename: PTT    Addendum  I have independently interviewed and examined the patient, and I agree with the physician assistant's findings.  This patient pre-procedure had a ABI essentially 0.  The angiogram demonstrates R CIA CTO and R CFA high grade stenosis with diminutive arteries in right leg likely due to poor inflow.  The patient's pain could not be controlled  with oral agents so she was admitted overnight.  This AM her right foot is still viable with intact motor and sensation.  - Will move up her scheduled operation to tomorrow: R CFA EA with BPA, L to R fem-fem BPG. - The risk, benefits, and alternative for bypass operations were discussed with the patient.  The patient is aware the risks include but are not limited to: bleeding, infection, myocardial infarction, stroke, limb loss, nerve damage, need for additional procedures in the future, wound complications, and inability to complete the bypass.   -  I have reiterated to the patient and family repeated that she is at increased risk for death and complications given her debilitated baseline and advanced again.  I am citing a mortality rate of 10%.   - The patient is aware of these risks and agreed to proceed. - Will get Cardiology to see her to see if any additional optimization possible.  I would not delay her procedure as she has a tenuous situation due to tandem lesions in R leg inflow.  Additional medical problems: Past Medical History  Diagnosis Date  . Hypertension   . Osteoporosis   . Pneumonia   . Anemia   . Arthritis   . Cancer     colon cancer  . Peripheral vascular disease     Chronic kidney disease stage III: I suspect  the patient's L kidney is no longer functioning as there is a left renal artery visualized without any nephrogram   Hyponatremia: continue some gentle preop hydration  Hypokalemia: KDur 40 meq x 2  Adele Barthel, MD [facility] Office: [phone] Pager: [phone]  07/04/2014, 7:54 AM

## 2014-07-04 NOTE — Progress Notes (Signed)
Not VT on tele. Actually, rate related bundle branch block. Continue with metoprolol.  Candee Furbish, MD

## 2014-07-04 NOTE — Progress Notes (Signed)
Pt had 7 bt run of V-tach.  Samantha, PA informed and stated she will get back to me.  Will continue to monitor.

## 2014-07-04 NOTE — Telephone Encounter (Addendum)
Message copied by Doristine Section on Tue Jul 04, 2014  9:17 AM ------      Message from: Peter Minium K      Created: Mon Jul 03, 2014  1:53 PM      Regarding: Schedule                   ----- Message -----         From: Conrad [place], MD         Sent: 07/03/2014   1:49 PM           To: [facility] [address]            MAYLEE BARE      [id number]      03/25/1923                  PROCEDURE:      1.  Bilateral common femoral artery cannulation under ultrasound guidance      2.  Placement catheter in aorta      3.  Aortogram      4.  Right leg runoff            Follow-up: 3 weeks            Orders(s) for follow-up: ASAP cardiac work-up for fem-fem bypass       ------  notified patients daughter,  patricia, of appt. with dr. Domenic Polite on 07-11-14 at 9 am for cardiac clearance

## 2014-07-05 ENCOUNTER — Encounter (HOSPITAL_COMMUNITY)
Admission: RE | Disposition: A | Discharge status: Skilled Nursing Facility | Payer: Self-pay | Source: Ambulatory Visit | Attending: Vascular Surgery

## 2014-07-05 ENCOUNTER — Inpatient Hospital Stay (HOSPITAL_COMMUNITY): Payer: Medicare Other | Admitting: Anesthesiology

## 2014-07-05 ENCOUNTER — Inpatient Hospital Stay (HOSPITAL_COMMUNITY): Payer: Medicare Other

## 2014-07-05 ENCOUNTER — Encounter (HOSPITAL_COMMUNITY): Payer: Self-pay

## 2014-07-05 ENCOUNTER — Inpatient Hospital Stay (HOSPITAL_COMMUNITY): Admission: RE | Admit: 2014-07-05 | Payer: Medicare Other | Source: Ambulatory Visit | Admitting: Vascular Surgery

## 2014-07-05 ENCOUNTER — Encounter (HOSPITAL_COMMUNITY): Payer: Medicare Other | Admitting: Anesthesiology

## 2014-07-05 DIAGNOSIS — I739 Peripheral vascular disease, unspecified: Secondary | ICD-10-CM

## 2014-07-05 HISTORY — PX: PATCH ANGIOPLASTY: SHX6230

## 2014-07-05 HISTORY — PX: ENDARTERECTOMY FEMORAL: SHX5804

## 2014-07-05 HISTORY — PX: FEMORAL-FEMORAL BYPASS GRAFT: SHX936

## 2014-07-05 LAB — COMPREHENSIVE METABOLIC PANEL
ALT: 13 U/L (ref 0–35)
AST: 22 U/L (ref 0–37)
Albumin: 2.3 g/dL — ABNORMAL LOW (ref 3.5–5.2)
Alkaline Phosphatase: 49 U/L (ref 39–117)
Anion gap: 12 (ref 5–15)
BUN: 22 mg/dL (ref 6–23)
CALCIUM: 8.3 mg/dL — AB (ref 8.4–10.5)
CO2: 21 meq/L (ref 19–32)
Chloride: 105 mEq/L (ref 96–112)
Creatinine, Ser: 0.91 mg/dL (ref 0.50–1.10)
GFR, EST AFRICAN AMERICAN: 63 mL/min — AB (ref >90)
GFR, EST NON AFRICAN AMERICAN: 55 mL/min — AB (ref >90)
GLUCOSE: 102 mg/dL — AB (ref 70–99)
Potassium: 5.8 mEq/L — ABNORMAL HIGH (ref 3.7–5.3)
Sodium: 138 mEq/L (ref 137–147)
Total Bilirubin: <0.2 mg/dL — ABNORMAL LOW (ref 0.3–1.2)
Total Protein: 5.8 g/dL — ABNORMAL LOW (ref 6.0–8.3)

## 2014-07-05 LAB — URINE MICROSCOPIC-ADD ON

## 2014-07-05 LAB — URINALYSIS, ROUTINE W REFLEX MICROSCOPIC
Bilirubin Urine: NEGATIVE
Glucose, UA: NEGATIVE mg/dL
Ketones, ur: NEGATIVE mg/dL
Leukocytes, UA: NEGATIVE
Nitrite: NEGATIVE
Protein, ur: NEGATIVE mg/dL
Specific Gravity, Urine: 1.011 (ref 1.005–1.030)
Urobilinogen, UA: 0.2 mg/dL (ref 0.0–1.0)
pH: 5.5 (ref 5.0–8.0)

## 2014-07-05 LAB — TYPE AND SCREEN
ABO/RH(D): A POS
Antibody Screen: NEGATIVE

## 2014-07-05 LAB — CBC
HCT: 28.1 % — ABNORMAL LOW (ref 36.0–46.0)
HCT: 28.2 % — ABNORMAL LOW (ref 36.0–46.0)
HEMOGLOBIN: 9.1 g/dL — AB (ref 12.0–15.0)
Hemoglobin: 9.3 g/dL — ABNORMAL LOW (ref 12.0–15.0)
MCH: 31.7 pg (ref 26.0–34.0)
MCH: 30.8 pg (ref 26.0–34.0)
MCHC: 32.4 g/dL (ref 30.0–36.0)
MCHC: 33 g/dL (ref 30.0–36.0)
MCV: 95.3 fL (ref 78.0–100.0)
MCV: 96.2 fL (ref 78.0–100.0)
PLATELETS: 345 10*3/uL (ref 150–400)
Platelets: 316 10*3/uL (ref 150–400)
RBC: 2.93 MIL/uL — AB (ref 3.87–5.11)
RBC: 2.95 MIL/uL — ABNORMAL LOW (ref 3.87–5.11)
RDW: 15 % (ref 11.5–15.5)
RDW: 15 % (ref 11.5–15.5)
WBC: 10.6 10*3/uL — AB (ref 4.0–10.5)
WBC: 8.3 10*3/uL (ref 4.0–10.5)

## 2014-07-05 LAB — PROTIME-INR
INR: 1.15 (ref 0.00–1.49)
Prothrombin Time: 14.7 seconds (ref 11.6–15.2)

## 2014-07-05 LAB — CREATININE, SERUM
CREATININE: 0.72 mg/dL (ref 0.50–1.10)
GFR calc Af Amer: 86 mL/min — ABNORMAL LOW (ref >90)
GFR, EST NON AFRICAN AMERICAN: 74 mL/min — AB (ref >90)

## 2014-07-05 LAB — SURGICAL PCR SCREEN
MRSA, PCR: NEGATIVE
Staphylococcus aureus: NEGATIVE

## 2014-07-05 LAB — APTT: aPTT: 40 s — ABNORMAL HIGH (ref 24–37)

## 2014-07-05 SURGERY — ENDARTERECTOMY, FEMORAL
Anesthesia: General | Site: Leg Upper | Laterality: Right

## 2014-07-05 MED ORDER — DEXTROSE 5 % IV SOLN
1.5000 g | Freq: Two times a day (BID) | INTRAVENOUS | Status: AC
  Administered 2014-07-05: 1.5 g via INTRAVENOUS
  Filled 2014-07-05 (×2): qty 1.5

## 2014-07-05 MED ORDER — ROCURONIUM BROMIDE 100 MG/10ML IV SOLN
INTRAVENOUS | Status: DC | PRN
  Administered 2014-07-05: 35 mg via INTRAVENOUS

## 2014-07-05 MED ORDER — DOCUSATE SODIUM 100 MG PO CAPS
100.0000 mg | ORAL_CAPSULE | Freq: Every day | ORAL | Status: DC
  Administered 2014-07-06 – 2014-07-10 (×4): 100 mg via ORAL
  Filled 2014-07-05 (×4): qty 1

## 2014-07-05 MED ORDER — PHENYLEPHRINE HCL 10 MG/ML IJ SOLN
10.0000 mg | INTRAVENOUS | Status: DC | PRN
  Administered 2014-07-05: 80 ug/min via INTRAVENOUS

## 2014-07-05 MED ORDER — LIDOCAINE HCL (CARDIAC) 20 MG/ML IV SOLN
INTRAVENOUS | Status: DC | PRN
  Administered 2014-07-05: 30 mg via INTRAVENOUS

## 2014-07-05 MED ORDER — ALUM & MAG HYDROXIDE-SIMETH 200-200-20 MG/5ML PO SUSP: 15.0000 mL | ORAL | Status: DC | PRN

## 2014-07-05 MED ORDER — POTASSIUM CHLORIDE CRYS ER 20 MEQ PO TBCR: 20.0000 meq | EXTENDED_RELEASE_TABLET | Freq: Every day | ORAL | Status: DC | PRN

## 2014-07-05 MED ORDER — SENNOSIDES-DOCUSATE SODIUM 8.6-50 MG PO TABS
1.0000 | ORAL_TABLET | Freq: Every evening | ORAL | Status: DC | PRN
  Administered 2014-07-07: 1 via ORAL
  Filled 2014-07-05: qty 1

## 2014-07-05 MED ORDER — PANTOPRAZOLE SODIUM 40 MG PO TBEC
40.0000 mg | DELAYED_RELEASE_TABLET | Freq: Every day | ORAL | Status: DC
  Administered 2014-07-06 – 2014-07-10 (×5): 40 mg via ORAL
  Filled 2014-07-05 (×5): qty 1

## 2014-07-05 MED ORDER — THROMBIN 20000 UNITS EX SOLR
CUTANEOUS | Status: DC | PRN
  Administered 2014-07-05: 11:00:00 via TOPICAL

## 2014-07-05 MED ORDER — FENTANYL CITRATE 0.05 MG/ML IJ SOLN: 25.0000 ug | INTRAMUSCULAR | Status: DC | PRN

## 2014-07-05 MED ORDER — PHENYLEPHRINE 40 MCG/ML (10ML) SYRINGE FOR IV PUSH (FOR BLOOD PRESSURE SUPPORT)
PREFILLED_SYRINGE | INTRAVENOUS | Status: AC
  Filled 2014-07-05: qty 10

## 2014-07-05 MED ORDER — ONDANSETRON HCL 4 MG/2ML IJ SOLN
INTRAMUSCULAR | Status: AC
  Filled 2014-07-05: qty 2

## 2014-07-05 MED ORDER — LIDOCAINE HCL (CARDIAC) 20 MG/ML IV SOLN
INTRAVENOUS | Status: AC
  Filled 2014-07-05: qty 5

## 2014-07-05 MED ORDER — DROPERIDOL 2.5 MG/ML IJ SOLN
0.6250 mg | INTRAMUSCULAR | Status: DC | PRN
  Filled 2014-07-05: qty 0.25

## 2014-07-05 MED ORDER — FENTANYL CITRATE 0.05 MG/ML IJ SOLN
INTRAMUSCULAR | Status: AC
  Filled 2014-07-05: qty 5

## 2014-07-05 MED ORDER — ROCURONIUM BROMIDE 50 MG/5ML IV SOLN
INTRAVENOUS | Status: AC
  Filled 2014-07-05: qty 1

## 2014-07-05 MED ORDER — GLYCOPYRROLATE 0.2 MG/ML IJ SOLN
INTRAMUSCULAR | Status: DC | PRN
  Administered 2014-07-05: 0.1 mg via INTRAVENOUS
  Administered 2014-07-05: 0.4 mg via INTRAVENOUS
  Administered 2014-07-05: 0.1 mg via INTRAVENOUS

## 2014-07-05 MED ORDER — GUAIFENESIN-DM 100-10 MG/5ML PO SYRP: 15.0000 mL | ORAL_SOLUTION | ORAL | Status: DC | PRN

## 2014-07-05 MED ORDER — METOPROLOL TARTRATE 1 MG/ML IV SOLN: 2.0000 mg | INTRAVENOUS | Status: DC | PRN

## 2014-07-05 MED ORDER — OXYCODONE-ACETAMINOPHEN 5-325 MG PO TABS
1.0000 | ORAL_TABLET | ORAL | Status: DC | PRN
  Administered 2014-07-06 – 2014-07-07 (×2): 1 via ORAL
  Filled 2014-07-05 (×2): qty 1

## 2014-07-05 MED ORDER — PROPOFOL 10 MG/ML IV BOLUS
INTRAVENOUS | Status: AC
  Filled 2014-07-05: qty 20

## 2014-07-05 MED ORDER — PHENYLEPHRINE HCL 10 MG/ML IJ SOLN
INTRAMUSCULAR | Status: DC | PRN
  Administered 2014-07-05 (×2): 80 ug via INTRAVENOUS

## 2014-07-05 MED ORDER — IOHEXOL 300 MG/ML  SOLN
INTRAMUSCULAR | Status: DC | PRN
  Administered 2014-07-05: 45 mL via INTRAVENOUS

## 2014-07-05 MED ORDER — GLYCOPYRROLATE 0.2 MG/ML IJ SOLN
INTRAMUSCULAR | Status: AC
  Filled 2014-07-05: qty 3

## 2014-07-05 MED ORDER — SODIUM CHLORIDE 0.9 % IV SOLN: INTRAVENOUS | Status: DC

## 2014-07-05 MED ORDER — ENOXAPARIN SODIUM 30 MG/0.3ML ~~LOC~~ SOLN
30.0000 mg | SUBCUTANEOUS | Status: DC
  Administered 2014-07-06 – 2014-07-10 (×5): 30 mg via SUBCUTANEOUS
  Filled 2014-07-05 (×7): qty 0.3

## 2014-07-05 MED ORDER — LABETALOL HCL 5 MG/ML IV SOLN
10.0000 mg | INTRAVENOUS | Status: DC | PRN
  Filled 2014-07-05: qty 4

## 2014-07-05 MED ORDER — 0.9 % SODIUM CHLORIDE (POUR BTL) OPTIME
TOPICAL | Status: DC | PRN
  Administered 2014-07-05: 2000 mL
  Administered 2014-07-05: 500 mL

## 2014-07-05 MED ORDER — SODIUM CHLORIDE 0.9 % IV SOLN: 500.0000 mL | Freq: Once | INTRAVENOUS | Status: AC | PRN

## 2014-07-05 MED ORDER — ONDANSETRON HCL 4 MG/2ML IJ SOLN
INTRAMUSCULAR | Status: DC | PRN
  Administered 2014-07-05: 4 mg via INTRAVENOUS

## 2014-07-05 MED ORDER — PHENOL 1.4 % MT LIQD: 1.0000 | OROMUCOSAL | Status: DC | PRN

## 2014-07-05 MED ORDER — THROMBIN 20000 UNITS EX SOLR
CUTANEOUS | Status: AC
  Filled 2014-07-05: qty 20000

## 2014-07-05 MED ORDER — MAGNESIUM SULFATE 40 MG/ML IJ SOLN
2.0000 g | Freq: Every day | INTRAMUSCULAR | Status: DC | PRN
  Filled 2014-07-05: qty 50

## 2014-07-05 MED ORDER — SODIUM POLYSTYRENE SULFONATE 15 GM/60ML PO SUSP
30.0000 g | Freq: Once | ORAL | Status: AC
  Administered 2014-07-05: 30 g via RECTAL
  Filled 2014-07-05: qty 120

## 2014-07-05 MED ORDER — MORPHINE SULFATE 2 MG/ML IJ SOLN
2.0000 mg | INTRAMUSCULAR | Status: DC | PRN
  Administered 2014-07-05 (×4): 2 mg via INTRAVENOUS
  Administered 2014-07-05 – 2014-07-06 (×2): 4 mg via INTRAVENOUS
  Filled 2014-07-05 (×2): qty 1
  Filled 2014-07-05 (×2): qty 2
  Filled 2014-07-05 (×2): qty 1

## 2014-07-05 MED ORDER — ONDANSETRON HCL 4 MG/2ML IJ SOLN: 4.0000 mg | Freq: Four times a day (QID) | INTRAMUSCULAR | Status: DC | PRN

## 2014-07-05 MED ORDER — FENTANYL CITRATE 0.05 MG/ML IJ SOLN
INTRAMUSCULAR | Status: DC | PRN
  Administered 2014-07-05: 25 ug via INTRAVENOUS
  Administered 2014-07-05: 50 ug via INTRAVENOUS
  Administered 2014-07-05 (×2): 25 ug via INTRAVENOUS
  Administered 2014-07-05: 50 ug via INTRAVENOUS

## 2014-07-05 MED ORDER — ASPIRIN 81 MG PO CHEW
81.0000 mg | CHEWABLE_TABLET | Freq: Every day | ORAL | Status: DC
  Administered 2014-07-06 – 2014-07-10 (×5): 81 mg via ORAL
  Filled 2014-07-05 (×5): qty 1

## 2014-07-05 MED ORDER — NEOSTIGMINE METHYLSULFATE 10 MG/10ML IV SOLN
INTRAVENOUS | Status: AC
  Filled 2014-07-05: qty 1

## 2014-07-05 MED ORDER — HYDRALAZINE HCL 20 MG/ML IJ SOLN: 10.0000 mg | INTRAMUSCULAR | Status: DC | PRN

## 2014-07-05 MED ORDER — HEPARIN SODIUM (PORCINE) 1000 UNIT/ML IJ SOLN
INTRAMUSCULAR | Status: DC | PRN
  Administered 2014-07-05: 2000 [IU] via INTRAVENOUS
  Administered 2014-07-05: 4000 [IU] via INTRAVENOUS

## 2014-07-05 MED ORDER — SODIUM CHLORIDE 0.9 % IR SOLN
Status: DC | PRN
  Administered 2014-07-05: 08:00:00

## 2014-07-05 MED ORDER — PROPOFOL 10 MG/ML IV BOLUS
INTRAVENOUS | Status: DC | PRN
  Administered 2014-07-05: 10 mg via INTRAVENOUS
  Administered 2014-07-05: 60 mg via INTRAVENOUS

## 2014-07-05 MED ORDER — ACETAMINOPHEN 325 MG PO TABS: 325.0000 mg | ORAL_TABLET | ORAL | Status: DC | PRN

## 2014-07-05 MED ORDER — BISACODYL 10 MG RE SUPP
10.0000 mg | Freq: Every day | RECTAL | Status: DC | PRN
  Administered 2014-07-08: 10 mg via RECTAL
  Filled 2014-07-05: qty 1

## 2014-07-05 MED ORDER — NEOSTIGMINE METHYLSULFATE 10 MG/10ML IV SOLN
INTRAVENOUS | Status: DC | PRN
  Administered 2014-07-05: 3 mg via INTRAVENOUS

## 2014-07-05 MED ORDER — ACETAMINOPHEN 650 MG RE SUPP: 325.0000 mg | RECTAL | Status: DC | PRN

## 2014-07-05 MED ORDER — DOPAMINE-DEXTROSE 3.2-5 MG/ML-% IV SOLN: 3.0000 ug/kg/min | INTRAVENOUS | Status: DC

## 2014-07-05 SURGICAL SUPPLY — 62 items
ADH SKN CLS APL DERMABOND .7 (GAUZE/BANDAGES/DRESSINGS) ×3
ATTRACTOMAT 16X20 MAGNETIC DRP (DRAPES) ×2 IMPLANT
BANDAGE ELASTIC 4 VELCRO ST LF (GAUZE/BANDAGES/DRESSINGS) IMPLANT
BLADE 10 SAFETY STRL DISP (BLADE) ×5 IMPLANT
CANISTER SUCTION 2500CC (MISCELLANEOUS) ×5 IMPLANT
CLIP TI MEDIUM 24 (CLIP) ×5 IMPLANT
CLIP TI WIDE RED SMALL 24 (CLIP) ×5 IMPLANT
COVER SURGICAL LIGHT HANDLE (MISCELLANEOUS) ×5 IMPLANT
DERMABOND ADVANCED (GAUZE/BANDAGES/DRESSINGS) ×2
DERMABOND ADVANCED .7 DNX12 (GAUZE/BANDAGES/DRESSINGS) IMPLANT
DRAIN CHANNEL 15F RND FF W/TCR (WOUND CARE) IMPLANT
DRAPE C-ARM 42X72 X-RAY (DRAPES) ×2 IMPLANT
DRAPE WARM FLUID 44X44 (DRAPE) ×5 IMPLANT
DRSG COVADERM 4X6 (GAUZE/BANDAGES/DRESSINGS) ×2 IMPLANT
DRSG COVADERM 4X8 (GAUZE/BANDAGES/DRESSINGS) ×2 IMPLANT
ELECT CAUTERY BLADE 6.4 (BLADE) ×2 IMPLANT
ELECT REM PT RETURN 9FT ADLT (ELECTROSURGICAL) ×5
ELECTRODE REM PT RTRN 9FT ADLT (ELECTROSURGICAL) ×3 IMPLANT
EVACUATOR SILICONE 100CC (DRAIN) IMPLANT
GAUZE SPONGE 4X4 16PLY XRAY LF (GAUZE/BANDAGES/DRESSINGS) IMPLANT
GLOVE BIO SURGEON STRL SZ7 (GLOVE) ×9 IMPLANT
GLOVE BIOGEL PI IND STRL 7.5 (GLOVE) ×3 IMPLANT
GLOVE BIOGEL PI INDICATOR 7.5 (GLOVE) ×6
GLOVE ECLIPSE 6.5 STRL STRAW (GLOVE) ×4 IMPLANT
GLOVE SS BIOGEL STRL SZ 7 (GLOVE) IMPLANT
GLOVE SUPERSENSE BIOGEL SZ 7 (GLOVE) ×4
GOWN STRL REUS W/ TWL LRG LVL3 (GOWN DISPOSABLE) ×9 IMPLANT
GOWN STRL REUS W/TWL LRG LVL3 (GOWN DISPOSABLE) ×15
GRAFT HEMASHIELD 8MM (Vascular Products) ×5 IMPLANT
GRAFT VASC STRG 30X8KNIT (Vascular Products) IMPLANT
INSERT FOGARTY SM (MISCELLANEOUS) ×5 IMPLANT
KIT BASIN OR (CUSTOM PROCEDURE TRAY) ×5 IMPLANT
KIT ROOM TURNOVER OR (KITS) ×5 IMPLANT
NS IRRIG 1000ML POUR BTL (IV SOLUTION) ×10 IMPLANT
PACK PERIPHERAL VASCULAR (CUSTOM PROCEDURE TRAY) ×5 IMPLANT
PAD ARMBOARD 7.5X6 YLW CONV (MISCELLANEOUS) ×10 IMPLANT
PATCH VASCULAR VASCU GUARD 1X6 (Vascular Products) ×4 IMPLANT
PENCIL BUTTON HOLSTER BLD 10FT (ELECTRODE) ×2 IMPLANT
PROBE PENCIL 8 MHZ STRL DISP (MISCELLANEOUS) ×2 IMPLANT
SET MICROPUNCTURE 5F STIFF (MISCELLANEOUS) ×2 IMPLANT
SPONGE INTESTINAL PEANUT (DISPOSABLE) ×2 IMPLANT
SPONGE SURGIFOAM ABS GEL 100 (HEMOSTASIS) IMPLANT
STAPLER VISISTAT 35W (STAPLE) IMPLANT
STOPCOCK 4 WAY LG BORE MALE ST (IV SETS) ×2 IMPLANT
SUT MNCRL AB 4-0 PS2 18 (SUTURE) ×10 IMPLANT
SUT PROLENE 5 0 C 1 24 (SUTURE) ×12 IMPLANT
SUT PROLENE 6 0 BV (SUTURE) ×6 IMPLANT
SUT PROLENE 7 0 BV 1 (SUTURE) ×2 IMPLANT
SUT PROLENE BLUE 7 0 (SUTURE) ×2 IMPLANT
SUT SILK 2 0 FS (SUTURE) IMPLANT
SUT SILK 3 0 (SUTURE) ×5
SUT SILK 3-0 18XBRD TIE 12 (SUTURE) IMPLANT
SUT VIC AB 2-0 CT1 27 (SUTURE) ×10
SUT VIC AB 2-0 CT1 TAPERPNT 27 (SUTURE) ×6 IMPLANT
SUT VIC AB 3-0 SH 27 (SUTURE) ×10
SUT VIC AB 3-0 SH 27X BRD (SUTURE) ×6 IMPLANT
TOWEL OR 17X24 6PK STRL BLUE (TOWEL DISPOSABLE) ×10 IMPLANT
TOWEL OR 17X26 10 PK STRL BLUE (TOWEL DISPOSABLE) ×5 IMPLANT
TRAY FOLEY CATH 16FRSI W/METER (SET/KITS/TRAYS/PACK) ×5 IMPLANT
TUBING EXTENTION W/L.L. (IV SETS) ×2 IMPLANT
UNDERPAD 30X30 INCONTINENT (UNDERPADS AND DIAPERS) ×5 IMPLANT
WATER STERILE IRR 1000ML POUR (IV SOLUTION) ×5 IMPLANT

## 2014-07-05 NOTE — Progress Notes (Signed)
Dr.Dickson notified about patient's episode of confusion. Will continue to monitor.

## 2014-07-05 NOTE — Progress Notes (Signed)
Patient potassium 5.8, Dr. Scot Dock made aware, ordered to give 30 mg kayexalate enema. Kayexalate given to patient and tolerated well. Will continue to monitor patient.

## 2014-07-05 NOTE — Clinical Documentation Improvement (Signed)
  Per eval by Registered Dietician 8/25 "meets criteria for Severe malnutrition in the context of chronic illness as evidenced by severe depletion of muscle and subcutaneous fat mass".   Possible Clinical Conditions? - Severe Protein Calorie Malnutrition - Other Condition  Supporting Information: - Subcutaneous Fat:  Orbital Region: severe depletion  Upper Arm Region: mild depletion  Thoracic and Lumbar Region: mild depletion  - Muscle:  Temple Region: mild depletion  Clavicle Bone Region: severe depletion  Clavicle and Acromion Bone Region: severe depletion  Scapular Bone Region: mild depletion  Dorsal Hand: mild depletion  Patellar Region: severe depletion  Anterior Thigh Region: severe depletion  Posterior Calf Region: severe depletion  Thank You, Ezekiel Ina ,RN Clinical Documentation Specialist:  [phone]  [place] Information Management

## 2014-07-05 NOTE — Anesthesia Procedure Notes (Signed)
Procedure Name: Intubation Date/Time: 07/05/2014 8:38 AM Performed by: Terrill Mohr Pre-anesthesia Checklist: Patient identified, Emergency Drugs available, Suction available and Patient being monitored Patient Re-evaluated:Patient Re-evaluated prior to inductionOxygen Delivery Method: Circle system utilized Preoxygenation: Pre-oxygenation with 100% oxygen Intubation Type: IV induction Ventilation: Mask ventilation without difficulty Laryngoscope Size: Mac and 3 Tube size: 7.0 mm Number of attempts: 1 Airway Equipment and Method: Stylet Placement Confirmation: ETT inserted through vocal cords under direct vision,  breath sounds checked- equal and bilateral and positive ETCO2 Secured at: 21 (cm at teeth) cm Tube secured with: Tape Dental Injury: Teeth and Oropharynx as per pre-operative assessment

## 2014-07-05 NOTE — Anesthesia Preprocedure Evaluation (Addendum)
Anesthesia Evaluation  Patient identified by MRN, date of birth, ID band Patient awake    Reviewed: Allergy & Precautions, H&P , NPO status , Patient's Chart, lab work & pertinent test results, reviewed documented beta blocker date and time   History of Anesthesia Complications Negative for: history of anesthetic complications  Airway Mallampati: I TM Distance: >3 FB Neck ROM: Full    Dental  (+) Loose, Dental Advisory Given   Pulmonary COPDformer smoker (quit 30 years),  breath sounds clear to auscultation        Cardiovascular hypertension, Pt. on medications and Pt. on home beta blockers - angina+ Peripheral Vascular Disease Rhythm:Regular Rate:Normal     Neuro/Psych negative neurological ROS     GI/Hepatic negative GI ROS, Neg liver ROS,   Endo/Other  negative endocrine ROS  Renal/GU negative Renal ROS     Musculoskeletal   Abdominal   Peds  Hematology  (+) Blood dyscrasia (Hb 9.1), anemia ,   Anesthesia Other Findings   Reproductive/Obstetrics                         Anesthesia Physical Anesthesia Plan  ASA: III  Anesthesia Plan: General   Post-op Pain Management:    Induction: Intravenous  Airway Management Planned: Oral ETT  Additional Equipment:   Intra-op Plan:   Post-operative Plan: Extubation in OR  Informed Consent: I have reviewed the patients History and Physical, chart, labs and discussed the procedure including the risks, benefits and alternatives for the proposed anesthesia with the patient or authorized representative who has indicated his/her understanding and acceptance.   Dental advisory given  Plan Discussed with: CRNA and Surgeon  Anesthesia Plan Comments: (Plan routine monitors, GETA)        Anesthesia Quick Evaluation

## 2014-07-05 NOTE — Progress Notes (Signed)
Patient refusing to keep telemetry monitor on, hitting nurses with monitor, CCMD notified. Will continue to monitor patient.

## 2014-07-05 NOTE — Transfer of Care (Signed)
Immediate Anesthesia Transfer of Care Note  Patient: Mary [name]  Procedure(s) Performed: Procedure(s): ENDARTERECTOMY FEMORAL (Right) BYPASS GRAFT FEMORAL-FEMORAL ARTERY (Bilateral) ANGIOGRAM RIGHT Femoral (Right)  Patient Location: PACU  Anesthesia Type:General  Level of Consciousness: awake, alert  and patient cooperative  Airway & Oxygen Therapy: Patient Spontanous Breathing and Patient connected to nasal cannula oxygen  Post-op Assessment: Report given to PACU RN and Post -op Vital signs reviewed and stable  Post vital signs: Reviewed and stable  Complications: No apparent anesthesia complications

## 2014-07-05 NOTE — Progress Notes (Signed)
       Patient resting comfortably Right PT doppler signal Toes "pink" BP stable  Aviyana Sonntag MAUREEN PA-C

## 2014-07-05 NOTE — Op Note (Signed)
OPERATIVE NOTE   PROCEDURE: 1.  Right iliofemoral endarterectomy with bovine patch angioplasty 2.  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 Tinnie Gens; Gerri Lins, [facility]   ANESTHESIA: general  ESTIMATED BLOOD LOSS: 100 cc  FINDING(S): 1. Non-flow limiting left posterior distal external iliac artery plaque 2. Near occluding common femoral artery and distal external iliac artery iliac artery calcific plaque 3. One out of two profunda arteries chronically occluded 4. Near occluding, calcific proximal right superficial femoral artery plaque 5. At end of case, widely patent femorofemoral graft with runoff via right groin via profunda femoral artery and superficial femoral artery.  Distal runoff via peroneal >anterior tibial artery.  Distal peroneal supplies posterior tibial artery.  Distal anterior tibial artery attentuates.  SPECIMEN(S):  none  INDICATIONS:   Mary [name] is a 80 y.o. female who  presents with right foot ischemic skin changes and rest pain.  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  Due to the severity of her symptoms, I offered her a right femoral endarterectomy and left to right femorofemoral bypass.  The risk, benefits, and alternative for bypass operations were discussed with the patient.  The patient is aware the risks include but are not limited to: bleeding, infection, myocardial infarction, stroke, limb loss, nerve damage, need for additional procedures in the future, wound complications, and inability to complete the bypass.  The patient is aware of these risks and agreed to proceed.   DESCRIPTION: After obtaining full informed written consent, the patient was brought back to the operating  room and placed supine upon the operating table.  The patient received IV antibiotics prior to induction.  After obtaining adequate anesthesia, the patient was prepped and draped in the standard fashion for: a femorofemoral bypass.  Dr. Kellie Simmering made an longitudinal incision over the left common femoral artery and dissected it out from the femoral bifurcation to the inguinal ligament.  He had control of all branches with vessel loops.  There was posterior plaque present that was no circumferentially calcified, so I an endarterectomy was not going to be necessary on the left side.  I turned my attention to the right groin.  I made a longitudinal incision over the common femoral artery.  Using electrocautery, I dissected out the common femoral artery from the inguinal ligament down to the femoral bifurcation.  There was severe calcification in the proximal superficial femoral artery.  I extended the incision over the proximal superficial femoral artery until I found a softer area.  The superficial femoral artery was dissected out down to this level.    At this point, I dissected a subcutaneous tunnel just superficial to the fascia in the suprapubic level.  I passed a clamp through the tunnel and pulled a 8 mm Dacron through the tunnel to facilitate the eventually femorofemoral bypass.  The patient was given 4000 units of Heparin intravenously, which was a therapeutic bolus.  In total, 6000 units of Heparin was administrated to achieve and maintain a therapeutic level of anticoagulation.  At this point, I clamped the distal right external iliac artery and placed the superficial femoral artery and both branches of the profunda femoral artery under tension with vessel loops.  All circumflex branches were placed under tension with vessel loops.  I made  an arteriotomy with a 11-blade and extended it proximally and distally with a Pott's scissor.  Immediately, two severely calcified near occluding femoral plaques were  evident.  Using a Penfield, I dissected the plaque loose in the common femoral artery and transected the plaque.  I then carried the dissection into the distal right external iliac artery.  I then carried the dissection into the femoral bifurcation.  This plaque feathered out in this segment.  No plaque had to be extracted from the profunda femoral artery branches.  I briefly released tension on these branches: only one had back-bleeding, suggesting chronic occlusion of the other one.  I then started the endarterectomy of the superficial femoral artery plaque.  This plaque was severely calcified and nearly lumen occluding.  I extended this dissection into the proximal 8 cm until I reach a segment where the disease was limited.  I was able to terminate the endarterectomy and tack some the lumen down with 7-0 interrupted sutures.  I spent the next 15 minutes, extracting loose intimal flaps until the residual disease was adherent.  I backbled the superficial femoral artery and some residual blood flow was present.  I then backbled the patent profunda femoral artery and the backbleeding was vigorous.  I also allowed the right external iliac artery to bleed and some intact constant blood flow as present.  I washed out the right iliofemoral segment and then sewed two bovine pericardial patches onto this segment due to the length of the arteriotomy with two running stitches of 6-0 Prolene.  Prior to completing this patch angioplasty, I backbled all arteries again: no thrombus was present.  I completed the patch angioplasty in the usual fashion.  While I was completing this extended right iliofemoral endarterectomy, Dr. Kellie Simmering clamped the left common femoral artery proximally and distally and made an arteriotomy.  He extended it proximally and distally.  The left end of the graft was spatulated to meet the geometry of the arteriotomy.  The underlying lumen of the left common femoral artery did not appear compromise by her  residual posterior plaque.  The graft was sewn to the left common femoral artery in an end-to-side configuration with a running stitch of 5-0 Prolene.  Prior to completing the anastomosis, all vessels were backbled: good bleeding was present in all vessels without thrombus.  The anastomosis was completed in the usual fashion and the graft was clamped in this groin.  After I completed the bovine patch angioplasty, I allowed the graft to bleeding antegrade and no thrombus was present.  The graft bleeding was weakly pulsatile but a reasonable pulse in the graft.  I reclamped the graft, readjusting the tension on the graft in the process.  I clamped the right iliofemoral system as previously and made an incision in the bovine patch this was extended proximally and distally.  I spatulated the right end of the graft to the geometry of the incision in the bovine patch, adjusting the graft length in the process.  I sewed the graft to the right bovine patched common femoral artery in an end-to-side configuration with a running stitch of 5-0 Prolene.  Prior to completing this anastomosis, I backbled the artery and graft: no clot was present.  After releasing the clamps, immediately there were palpable superficial femoral artery and profunda femoral artery pulses in the right groin.  I turned my attention to the left groin.  I cannulated the femorofemoral graft with a micropuncture needle and loaded the microwire into the graft.  The needle was exchanged for a microsheath.  The wire was removed.  The sheath was attached to the IV tubing.  I completed a completion angiogram.  The findings are listed above.  Essentially, the femorofemoral graft has excellent flow without technical issues.  There is good runoff via the right profunda femoral artery and superficial femoral artery.  Distally this patient has peroneal runoff which gives off a posterior tibial artery.  There is an anterior tibial artery which also fills but  appears to diminish distally compared to the peroneal artery.   At this point, I sewed a u-stitch of 6-0 Prolene around the microsheath and removed it and tied the stitch.    Both groins were packed with thrombin and gelfoam.  After a few minutes, no further bleeding was present.  I packed both groins with dry dressings and no further bleeding was present.  Each groin was repaired with a double layer of 2-0 Vicryl and a double layer of 3-0 Vicryl, followed by a running subcuticular of 4-0 Monocryl.  The skin was cleaned, dried, and reinforced with Dermabond.    At the end of the case, I could doppler a Left posterior tibial artery and anterior tibial artery signal.  On the right, I could doppler a posterior tibial artery signal.  COMPLICATIONS: none  CONDITION: stable  Adele Barthel, MD [facility] Office: [phone] Pager: [phone]  07/05/2014, 12:20 PM

## 2014-07-05 NOTE — Progress Notes (Signed)
SUBJECTIVE:  She is not reporting chest pain or SOB   PHYSICAL EXAM Filed Vitals:   07/05/14 1300 07/05/14 1315 07/05/14 1330 07/05/14 1408  BP: 132/61 140/100 120/86 136/47  Pulse: 82   96  Temp:   98.3 F (36.8 C) 98.5 F (36.9 C)  TempSrc:    Oral  Resp: 21 19 20 25   Height:    5' (1.524 m)  Weight:    105 lb 2.6 oz (47.7 kg)  SpO2: 100% 100% 100% 96%   General:  No distress Lungs:  Clear Heart:  RRR Abdomen:  Positive bowel sounds, no rebound no guarding Extremities:  No edema   LABS:  Results for orders placed during the hospital encounter of 07/03/14 (from the past 24 hour(s))  APTT     Status: Abnormal   Collection Time    07/04/14 11:49 PM      Result Value Ref Range   aPTT 40 (*) 24 - 37 seconds  CBC     Status: Abnormal   Collection Time    07/04/14 11:49 PM      Result Value Ref Range   WBC 8.3  4.0 - 10.5 K/uL   RBC 2.95 (*) 3.87 - 5.11 MIL/uL   Hemoglobin 9.1 (*) 12.0 - 15.0 g/dL   HCT 28.1 (*) 36.0 - 46.0 %   MCV 95.3  78.0 - 100.0 fL   MCH 30.8  26.0 - 34.0 pg   MCHC 32.4  30.0 - 36.0 g/dL   RDW 15.0  11.5 - 15.5 %   Platelets 316  150 - 400 K/uL  COMPREHENSIVE METABOLIC PANEL     Status: Abnormal   Collection Time    07/04/14 11:49 PM      Result Value Ref Range   Sodium 138  137 - 147 mEq/L   Potassium 5.8 (*) 3.7 - 5.3 mEq/L   Chloride 105  96 - 112 mEq/L   CO2 21  19 - 32 mEq/L   Glucose, Bld 102 (*) 70 - 99 mg/dL   BUN 22  6 - 23 mg/dL   Creatinine, Ser 0.91  0.50 - 1.10 mg/dL   Calcium 8.3 (*) 8.4 - 10.5 mg/dL   Total Protein 5.8 (*) 6.0 - 8.3 g/dL   Albumin 2.3 (*) 3.5 - 5.2 g/dL   AST 22  0 - 37 U/L   ALT 13  0 - 35 U/L   Alkaline Phosphatase 49  39 - 117 U/L   Total Bilirubin <0.2 (*) 0.3 - 1.2 mg/dL   GFR calc non Af Amer 55 (*) >90 mL/min   GFR calc Af Amer 63 (*) >90 mL/min   Anion gap 12  5 - 15  PROTIME-INR     Status: None   Collection Time    07/04/14 11:49 PM      Result Value Ref Range   Prothrombin Time 14.7   11.6 - 15.2 seconds   INR 1.15  0.00 - 1.49  TYPE AND SCREEN     Status: None   Collection Time    07/04/14 11:49 PM      Result Value Ref Range   ABO/RH(D) A POS     Antibody Screen NEG     Sample Expiration 07/07/2014    URINALYSIS, ROUTINE W REFLEX MICROSCOPIC     Status: Abnormal   Collection Time    07/05/14  4:00 AM      Result Value Ref Range   Color, Urine YELLOW  YELLOW   APPearance CLEAR  CLEAR   Specific Gravity, Urine 1.011  1.005 - 1.030   pH 5.5  5.0 - 8.0   Glucose, UA NEGATIVE  NEGATIVE mg/dL   Hgb urine dipstick TRACE (*) NEGATIVE   Bilirubin Urine NEGATIVE  NEGATIVE   Ketones, ur NEGATIVE  NEGATIVE mg/dL   Protein, ur NEGATIVE  NEGATIVE mg/dL   Urobilinogen, UA 0.2  0.0 - 1.0 mg/dL   Nitrite NEGATIVE  NEGATIVE   Leukocytes, UA NEGATIVE  NEGATIVE  URINE MICROSCOPIC-ADD ON     Status: None   Collection Time    07/05/14  4:00 AM      Result Value Ref Range   Squamous Epithelial / LPF RARE  RARE   WBC, UA 0-2  <3 WBC/hpf   RBC / HPF 3-6  <3 RBC/hpf  SURGICAL PCR SCREEN     Status: None   Collection Time    07/05/14  5:22 AM      Result Value Ref Range   MRSA, PCR NEGATIVE  NEGATIVE   Staphylococcus aureus NEGATIVE  NEGATIVE    Intake/Output Summary (Last 24 hours) at 07/05/14 1458 Last data filed at 07/05/14 1400  Gross per 24 hour  Intake 1838.17 ml  Output   1925 ml  Net -86.83 ml    ASSESSMENT AND PLAN:  PAD:  Now post op. No obvious cardiac complications.   I did review the telemetry and she has PACs.  Of note, there is no atrial fib.     Minus Breeding 07/05/2014 2:58 PM

## 2014-07-05 NOTE — Interval H&P Note (Signed)
[facility]  History and Physical Update  The patient was interviewed and re-examined.  The patient's previous History and Physical has been reviewed and is unchanged from my previous consult.  The interval angiogram demonstrates a R CIA CTO that cannot be crossed and a R CFA near occlusion.  The risk, benefits, and alternative for bypass operations were discussed with the patient.  The patient is aware the risks include but are not limited to: bleeding, infection, myocardial infarction, stroke, limb loss, nerve damage, need for additional procedures in the future, wound complications, and inability to complete the bypass.  The patient is aware of these risks and agreed to proceed.    Adele Barthel, MD [facility] [place] Office: [phone] Pager: [phone]  07/05/2014, 8:23 AM

## 2014-07-05 NOTE — Anesthesia Postprocedure Evaluation (Signed)
  Anesthesia Post-op Note  Patient: Mary [name]  Procedure(s) Performed: Procedure(s): ENDARTERECTOMY FEMORAL (Right) BYPASS GRAFT FEMORAL-FEMORAL ARTERY (Bilateral) ANGIOGRAM RIGHT Femoral (Right) PATCH ANGIOPLASTY-Right Femoral Artery (Right)  Patient Location: PACU  Anesthesia Type:General  Level of Consciousness: awake, alert  and patient cooperative  Airway and Oxygen Therapy: Patient Spontanous Breathing and Patient connected to nasal cannula oxygen  Post-op Pain: none  Post-op Assessment: Post-op Vital signs reviewed, Patient's Cardiovascular Status Stable, Respiratory Function Stable, Patent Airway, No signs of Nausea or vomiting and Pain level controlled  Post-op Vital Signs: Reviewed and stable  Last Vitals:  Filed Vitals:   07/05/14 1330  BP: 120/86  Pulse:   Temp: 36.8 C  Resp: 20    Complications: No apparent anesthesia complications

## 2014-07-06 DIAGNOSIS — Z48812 Encounter for surgical aftercare following surgery on the circulatory system: Secondary | ICD-10-CM

## 2014-07-06 LAB — BASIC METABOLIC PANEL
ANION GAP: 17 — AB (ref 5–15)
BUN: 8 mg/dL (ref 6–23)
CALCIUM: 7.7 mg/dL — AB (ref 8.4–10.5)
CO2: 18 mEq/L — ABNORMAL LOW (ref 19–32)
Chloride: 107 mEq/L (ref 96–112)
Creatinine, Ser: 0.73 mg/dL (ref 0.50–1.10)
GFR calc Af Amer: 86 mL/min — ABNORMAL LOW (ref >90)
GFR, EST NON AFRICAN AMERICAN: 74 mL/min — AB (ref >90)
Glucose, Bld: 103 mg/dL — ABNORMAL HIGH (ref 70–99)
POTASSIUM: 4.2 meq/L (ref 3.7–5.3)
SODIUM: 142 meq/L (ref 137–147)

## 2014-07-06 LAB — CBC
HCT: 29.5 % — ABNORMAL LOW (ref 36.0–46.0)
HEMOGLOBIN: 9.6 g/dL — AB (ref 12.0–15.0)
MCH: 31.5 pg (ref 26.0–34.0)
MCHC: 32.5 g/dL (ref 30.0–36.0)
MCV: 96.7 fL (ref 78.0–100.0)
PLATELETS: 368 10*3/uL (ref 150–400)
RBC: 3.05 MIL/uL — AB (ref 3.87–5.11)
RDW: 15.1 % (ref 11.5–15.5)
WBC: 15.1 10*3/uL — ABNORMAL HIGH (ref 4.0–10.5)

## 2014-07-06 LAB — LIPID PANEL
CHOLESTEROL: 121 mg/dL (ref 0–200)
HDL: 34 mg/dL — ABNORMAL LOW (ref >39)
LDL Cholesterol: 66 mg/dL (ref 0–99)
Total CHOL/HDL Ratio: 3.6 RATIO
Triglycerides: 104 mg/dL (ref <150)
VLDL: 21 mg/dL (ref 0–40)

## 2014-07-06 MED ORDER — CEFAZOLIN SODIUM 1-5 GM-% IV SOLN
1.0000 g | Freq: Two times a day (BID) | INTRAVENOUS | Status: DC
  Administered 2014-07-06 – 2014-07-10 (×8): 1 g via INTRAVENOUS
  Filled 2014-07-06 (×13): qty 50

## 2014-07-06 MED ORDER — ENSURE COMPLETE PO LIQD
237.0000 mL | Freq: Two times a day (BID) | ORAL | Status: DC
  Administered 2014-07-06 – 2014-07-10 (×7): 237 mL via ORAL

## 2014-07-06 MED ORDER — ENSURE PUDDING PO PUDG
1.0000 | Freq: Three times a day (TID) | ORAL | Status: DC
  Administered 2014-07-06 – 2014-07-09 (×6): 1 via ORAL

## 2014-07-06 MED ORDER — CEFAZOLIN SODIUM 1 G IJ SOLR: 1.0000 g | Freq: Two times a day (BID) | INTRAMUSCULAR | Status: DC

## 2014-07-06 NOTE — Care Management Note (Signed)
    Page 1 of 1   07/10/2014     5:26:45 PM CARE MANAGEMENT NOTE 07/10/2014  Patient:  Mary [name], Mary [name]   Account Number:  [id number]  Date Initiated:  07/06/2014  Documentation initiated by:  Marvetta Gibbons  Subjective/Objective Assessment:   Pt admitted RLE critical limb ischemia     Action/Plan:   PTA pt lived at home alone- PT/OT evals   Anticipated DC Date:  07/07/2014   Anticipated DC Plan:  SKILLED NURSING FACILITY  In-house referral  Clinical Social Worker      DC Forensic scientist  CM consult      [facility] Choice  HOME HEALTH   Choice offered to / List presented to:             Status of service:  Completed, signed off Medicare Important Message given?  YES (If response is "NO", the following Medicare IM given date fields will be blank) Date Medicare IM given:  07/06/2014 Medicare IM given by:  Marvetta Gibbons Date Additional Medicare IM given:  07/10/2014 Additional Medicare IM given by:  Almyra Free Mickayla Trouten  Discharge Disposition:  [place]  Per UR Regulation:  Reviewed for med. necessity/level of care/duration of stay  If discussed at [place] of Stay Meetings, dates discussed:    Comments:  07/10/14 Ellan Lambert, RN, BSN [phone] Pt discharging to SNF today, per CSW arrangments.  07/06/14 1500- Marvetta Gibbons RN, BSN [phone] Spoke with pt, daughter and family at bedside regarding d/c plans- per PT/OT recommendations HH with 24 hr supervision- per conversation family states that they can provide some supervision but they work during day and would have to look into private pay as [name] possible avenue (list provided) pt stated that she lived alone and would feel better going to [name] for rehab until stronger then home with assistance- [facility] would be first preference- CSW consulted for possible ST[facility] placement- NCM will continue to follow as [name] backup - copy of mailed Medicare IM given to pt and signed copy placed in shadow chart

## 2014-07-06 NOTE — Evaluation (Signed)
Physical Therapy Evaluation Patient Details Name: Mary [name] MRN: [id number] DOB: August 10, 1923 Today's Date: 07/06/2014   History of Present Illness  Patient is an 80 yo female s/p Right iliofemoral endarterectomy with bovine patch angioplasty and  Left to right femorofemoral bypass.  Clinical Impression  Patient demonstrates deficits in functional mobility as indicated below. Will need continued skilled PT to address deficits and maximize function. Will see as indicated and progress as tolerated.     Follow Up Recommendations Home health PT;Supervision/Assistance - 24 hour    Equipment Recommendations  None recommended by PT    Recommendations for Other Services       Precautions / Restrictions Precautions Precautions: Fall Restrictions Weight Bearing Restrictions: No      Mobility  Bed Mobility               General bed mobility comments: pt received up in chair  Transfers Overall transfer level: Needs assistance Equipment used: Rolling walker (2 wheeled) Transfers: Sit to/from Stand Sit to Stand: Min assist;From elevated surface;+2 safety/equipment         General transfer comment: Pt with increased LOB posteriorly with initial standing.  Ambulation/Gait Ambulation/Gait assistance: Min assist Ambulation Distance (Feet): 60 Feet Assistive device: Rolling walker (2 wheeled) Gait Pattern/deviations: Step-through pattern;Decreased stride length;Drifts right/left;Trunk flexed;Narrow base of support Gait velocity: decreased Gait velocity interpretation: Below normal speed for age/gender General Gait Details: some instability noted with ambulation  Stairs            Wheelchair Mobility    Modified Rankin (Stroke Patients Only)       Balance Overall balance assessment: Needs assistance   Sitting balance-Leahy Scale: Good   Postural control: Posterior lean Standing balance support: Single extremity supported Standing balance-Leahy Scale:  Poor                               Pertinent Vitals/Pain Pain Assessment: No/denies pain    Home Living Family/patient expects to be discharged to:: Private residence Living Arrangements: Alone Available Help at Discharge: Family;Available 24 hours/day Type of Home: House Home Access: Stairs to enter Entrance Stairs-Rails: Left (wall on the right side) Entrance Stairs-Number of Steps: 3 in front 8 in the back Home Layout: One level Home Equipment: Walker - 4 wheels;Shower seat      Prior Function Level of Independence: Independent         Comments: did her own yard work and was completley independent     Journalist, newspaper   Dominant Hand: Right    Extremity/Trunk Assessment   Upper Extremity Assessment: Overall WFL for tasks assessed           Lower Extremity Assessment: Generalized weakness      Cervical / Trunk Assessment: Kyphotic  Communication   Communication: HOH  Cognition Arousal/Alertness: Awake/alert Behavior During Therapy: WFL for tasks assessed/performed Overall Cognitive Status: Within Functional Limits for tasks assessed (Pt not oriented to month stated August)                      General Comments      Exercises        Assessment/Plan    PT Assessment Patient needs continued PT services  PT Diagnosis Difficulty walking;Abnormality of gait;Generalized weakness;Acute pain   PT Problem List Decreased strength;Decreased range of motion;Decreased activity tolerance;Decreased balance;Decreased mobility;Decreased safety awareness;Pain  PT Treatment Interventions DME instruction;Gait training;Stair training;Functional mobility training;Therapeutic activities;Therapeutic exercise;Balance training;Patient/family  education   PT Goals (Current goals can be found in the Care Plan section) Acute Rehab PT Goals Patient Stated Goal: Pt wants to get back to taking care of herself. PT Goal Formulation: With patient/family Time For  Goal Achievement: 07/20/14 Potential to Achieve Goals: Good    Frequency Min 3X/week   Barriers to discharge        Co-evaluation   Reason for Co-Treatment: For patient/therapist safety           End of Session Equipment Utilized During Treatment: Gait belt Activity Tolerance: Patient tolerated treatment well;Patient limited by fatigue Patient left: in chair;with call bell/phone within reach;with family/visitor present Nurse Communication: Mobility status         Time: 9147-8295 PT Time Calculation (min): 31 min   Charges:   PT Evaluation $Initial PT Evaluation Tier I: 1 Procedure PT Treatments $Therapeutic Activity: 8-22 mins   PT G CodesDuncan Dull 07/06/2014, 2:17 PM Alben Deeds, [place] DPT  [phone]

## 2014-07-06 NOTE — Evaluation (Signed)
Occupational Therapy Evaluation Patient Details Name: Mary [name] MRN: [id number] DOB: Nov 09, 1923 Today's Date: 07/06/2014    History of Present Illness Mary [name] is a 80 y.o. (04-04-1923) female who presents with chief complaint: right leg and foot pain.  Onset of symptom occurred January 15 but has worsened since April.  Pain is described as sharp and crampy, severity 3-6/10, and associated with short distance ambulation and occasionally at rest  right femoral by-pass graft performed via Dr. Bridgett Larsson..   Clinical Impression   Pt is doing well from a selfcare stand point but still needs min assist for balance and safety.  Feel she will benefit from acute care OT to help progress back to modified independent level for discharge home.  She will likely still need supervision at discharge from acute care so recommend [place] as well.  Daughter and family will provide 24 hour supervision.    Follow Up Recommendations  Home health OT;Supervision/Assistance - 24 hour    Equipment Recommendations  None recommended by OT       Precautions / Restrictions Precautions Precautions: Fall Restrictions Weight Bearing Restrictions: No      Mobility Bed Mobility                  Transfers Overall transfer level: Needs assistance Equipment used: Rolling walker (2 wheeled) Transfers: Sit to/from Stand Sit to Stand: Min assist;From elevated surface;+2 safety/equipment         General transfer comment: Pt with increased LOB posteriorly with initial standing.    Balance Overall balance assessment: Needs assistance   Sitting balance-Leahy Scale: Good   Postural control: Posterior lean Standing balance support: Single extremity supported Standing balance-Leahy Scale: Poor                              ADL Overall ADL's : Needs assistance/impaired Eating/Feeding: Independent   Grooming: Wash/dry hands;Sitting;Supervision/safety   Upper Body Bathing:  Supervision/ safety;Sitting   Lower Body Bathing: Minimal assistance;Sit to/from stand   Upper Body Dressing : Set up;Sitting   Lower Body Dressing: Minimal assistance   Toilet Transfer: +2 for safety/equipment;Minimal assistance;Comfort height toilet Toilet Transfer Details (indicate cue type and reason): Pt needed hand held assist for transfer to the bathroom and utilized the RW for transfer back to the bedside chair. Toileting- Clothing Manipulation and Hygiene: Minimal assistance;Sit to/from stand       Functional mobility during ADLs: Minimal assistance;+2 for safety/equipment General ADL Comments: Pt with kyphotic posture in standing and with mobility.  Will have initial 24 hour supervision from family as well.        Perception Perception Perception Tested?: No   Praxis Praxis Praxis tested?: Within functional limits    Pertinent Vitals/Pain Pain Assessment: No/denies pain     Hand Dominance Right   Extremity/Trunk Assessment Upper Extremity Assessment Upper Extremity Assessment: Overall WFL for tasks assessed   Lower Extremity Assessment Lower Extremity Assessment: Defer to PT evaluation   Cervical / Trunk Assessment Cervical / Trunk Assessment: Kyphotic   Communication Communication Communication: HOH   Cognition Arousal/Alertness: Awake/alert Behavior During Therapy: WFL for tasks assessed/performed Overall Cognitive Status: Within Functional Limits for tasks assessed (Pt not oriented to month stated August)                                Home Living Family/patient expects to be discharged to:: Private  residence Living Arrangements: Alone Available Help at Discharge: Family;Available 24 hours/day Type of Home: House Home Access: Stairs to enter [employer] of Steps: 3 in front 8 in the back Entrance Stairs-Rails: Left (wall on the right side) Home Layout: One level     Bathroom Shower/Tub: Engineer, site: Standard Bathroom Accessibility: Yes   Home Equipment: Environmental consultant - 4 wheels;Shower seat          Prior Functioning/Environment Level of Independence: Independent        Comments: did her own yard work and was completley independent    OT Diagnosis: Generalized weakness   OT Problem List: Decreased strength;Impaired balance (sitting and/or standing)   OT Treatment/Interventions: Self-care/ADL training;Neuromuscular education;Therapeutic activities;DME and/or AE instruction;Balance training;Patient/family education    OT Goals(Current goals can be found in the care plan section) Acute Rehab OT Goals Patient Stated Goal: Pt wants to get back to taking care of herself. OT Goal Formulation: With patient/family Time For Goal Achievement: 07/20/14 Potential to Achieve Goals: Good  OT Frequency: Min 2X/week           Co-evaluation PT/OT/SLP Co-Evaluation/Treatment: Yes Reason for Co-Treatment: For patient/therapist safety          End of Session Equipment Utilized During Treatment: Gait belt Nurse Communication: Mobility status  Activity Tolerance: Patient tolerated treatment well Patient left: in chair;with call bell/phone within reach;with family/visitor present   Time: 0998-3382 OT Time Calculation (min): 31 min Charges:  OT General Charges $OT Visit: 1 Procedure OT Evaluation $Initial OT Evaluation Tier I: 1 Procedure OT Treatments $Self Care/Home Management : 8-22 mins G-Codes:    Lakyn Mantione July 11, 2014, 12:33 PM OTR/L

## 2014-07-06 NOTE — Progress Notes (Signed)
VASCULAR LAB PRELIMINARY  ARTERIAL  ABI completed:    RIGHT    LEFT    PRESSURE WAVEFORM  PRESSURE WAVEFORM  BRACHIAL 124 Triphasic BRACHIAL 111 Triphasic  DP  Unable to insonate DP 29 Dampened monophasic  AT   AT    PT 43 Monophasic PT 44 Monophasic  PER 48 Dampened monophasic PER    GREAT TOE  NA GREAT TOE  NA    RIGHT LEFT  ABI 0.39 0.35    Bilateral ABIs are suggestive of severe arterial insufficiency.  07/06/2014 3:11 PM Maudry Mayhew, RVT, RDCS, RDMS

## 2014-07-06 NOTE — Progress Notes (Addendum)
     Subjective  - Answers yes no questions oriented to place.  Pain has still been an issue from [place].  She answers yes when asked if her right foot feels better.   Objective 138/55 120 97.9 F (36.6 C) (Oral) 28 100%  Intake/Output Summary (Last 24 hours) at 07/06/14 0729 Last data filed at 07/06/14 0640  Gross per 24 hour  Intake 2578.17 ml  Output   2025 ml  Net 553.17 ml    Lumbar dressing changed, thin yellow drainage.  Not thick or purulent. Skin without erythema surrounding incision. Right foot pink/red. Doppler PT, active range of motion intact of foot and ankle. Groin dressing clean and dry.  Groins soft.    Assessment/Planning: POD #1 PROCEDURE:  1. Right iliofemoral endarterectomy with bovine patch angioplasty  2. Left to right femorofemoral bypass S/P lumbar surgery decompression 06/19/2014 WBC elevated 10.6 now 15.. Afebrile After pain medication at 0300 patient became combative and restraint mittens were placed.  -Severe malnutrition in the context of chronic illness  -Underweight  INTERVENTION:  Ensure Complete PO BID, each supplement provides 350 kcal and 13 grams of protein BP stable 138-141/53-55 HR 109-120 elevates when moved for repositioning Lovenox for DVT prophylaxis   [name] [facility] 07/06/2014 7:29 AM --  Laboratory Lab Results:  Recent Labs  07/05/14 1535 07/06/14 0341  WBC 10.6* 15.1*  HGB 9.3* 9.6*  HCT 28.2* 29.5*  PLT 345 368   BMET  Recent Labs  07/04/14 2349 07/05/14 1535 07/06/14 0341  NA 138  --  142  K 5.8*  --  4.2  CL 105  --  107  CO2 21  --  18*  GLUCOSE 102*  --  103*  BUN 22  --  8  CREATININE 0.91 0.72 0.73  CALCIUM 8.3*  --  7.7*    COAG Lab Results  Component Value Date   INR 1.15 07/04/2014   INR 1.08 03/18/2012   No results found for this basename: PTT   Addendum  I have independently interviewed and examined the patient, and I agree with the physician assistant's findings. Both  groin bandaged.  Both feet pink.  Easily dopplerable R PT: none previously.  Some residual eschar in right foot.  - R foot dry gangrene: appears to be mainly superficial ulcerated areas, will let her demarcate and resolve on her own if possible, ABI pending, Lipid profile does not justify statin, keep on ASA for now  - Debilitation/Failure to thrive; suspect SNF placement will be needed, awaiting PT/OT formal evaluation  - Cardiac: bradycardia periop then tachy with mobilization, Cardiology following, keep in stepdown ICU at least 24 hours for her cardiac status.  I am concerned she will bounce back to the ICU due to cardiac concerns.  - Protein malnutrition: Nutrition consulted, I have emphasized to the patient the importance of dietary intake.  Supplementation as recommended by Nutrition  - s/p back surgery: some serous drain from back surgery, will start some Ancef as a precaution given the graft presence and elevated WBC (partially reactive from surgery), pt has never complained of surgical incision pain in her back or had any elevated temperature.  - Elevated Cr: resolved, no left kidney nephrogram on angiogram    As a precaution will start some Ancef to cover for possible skin contamination from prior back surgery.    Adele Barthel, MD [facility] [place] Office: [phone] Pager: [phone]  07/06/2014, 12:13 PM

## 2014-07-07 ENCOUNTER — Encounter (HOSPITAL_COMMUNITY): Payer: Self-pay | Admitting: Vascular Surgery

## 2014-07-07 MED ORDER — MEGESTROL ACETATE 40 MG/ML PO SUSP
400.0000 mg | Freq: Every day | ORAL | Status: DC
  Administered 2014-07-07 – 2014-07-10 (×4): 400 mg via ORAL
  Filled 2014-07-07 (×4): qty 10

## 2014-07-07 NOTE — Clinical Social Work Placement (Addendum)
Clinical Social Work Department CLINICAL SOCIAL WORK PLACEMENT NOTE 07/07/2014  Patient:  Mary [name], Mary [name]  Account Number:  [id number] Admit date:  07/03/2014  Clinical Social Worker:  Paulette Blanch BIBBS, LCSWA  Date/time:  07/07/2014 02:08 PM  Clinical Social Work is seeking post-discharge placement for this patient at the following level of care:   SKILLED NURSING   (*CSW will update this form in Epic as items are completed)   07/07/2014  Patient/family provided with [place] Department of Clinical Social Work's list of facilities offering this level of care within the geographic area requested by the patient (or if unable, by the patient's family).  07/07/2014  Patient/family informed of their freedom to choose among providers that offer the needed level of care, that participate in Medicare, Medicaid or managed care program needed by the patient, have an available bed and are willing to accept the patient.  07/07/2014  Patient/family informed of [facility]' ownership interest in [facility], as well as of the fact that they are under no obligation to receive care at this facility.  PASARR submitted to EDS on 07/07/2014 PASARR number received on 07/07/2014  FL2 transmitted to all facilities in geographic area requested by pt/family on  07/07/2014 FL2 transmitted to all facilities within larger geographic area on 07/07/2014  Patient informed that his/her managed care company has contracts with or will negotiate with  certain facilities, including the following:     Patient/family informed of bed offers received:  07/10/2014 Patient chooses bed at  [facility] Physician recommends and patient chooses bed at    Patient to be transferred to [facility] on  07/10/2014   Patient to be transferred to facility by  [facility] Patient and family notified of transfer on 07/10/2014 Name of family member notified:   Mardene Celeste (Daughter at Bedside)  The following  physician request were entered in Epic:   Additional Comments:  Starr School, MSW, [place]

## 2014-07-07 NOTE — Clinical Social Work Psychosocial (Signed)
Clinical Social Work Department BRIEF PSYCHOSOCIAL ASSESSMENT 07/07/2014  Patient:  Mary [name], Mary [name]     Account Number:  [id number]     Admit date:  07/03/2014  Clinical Social Worker:  Marciano Sequin  Date/Time:  07/07/2014 01:59 PM  Referred by:  Physician  Date Referred:  07/07/2014 Referred for  SNF Placement   Other Referral:   Interview type:  Patient Other interview type:    PSYCHOSOCIAL DATA Living Status:  ALONE Admitted from facility:   Level of care:  Independent Living Primary support name:  Rupert Stacks, [phone] Primary support relationship to patient:  FAMILY Degree of support available:   Good    CURRENT CONCERNS  Other Concerns:    SOCIAL WORK ASSESSMENT / PLAN CSW met pt and family at bedside. CSW introude self and purpose of visit. Pt presented with a happy affect and cheerful mood. Pt reported that she lives alone and has one other family members who live in [place] with her. Pt reported having family in [place] also well. Pt reported that she woud like rehab because she does not have care at home. CSW and family discussed rehabs choices as [facility], [place] or [employer] [facility]. CSW explained the SNF process to the family. CSW provided family with contact information for further questions.   Assessment/plan status:  Information/Referral to [employer] Other assessment/ plan:   Information/referral to community resources:   SNF list    PATIENT'S/FAMILY'S RESPONSE TO PLAN OF CARE: agree    Greta Doom, MSW, [place]

## 2014-07-07 NOTE — Progress Notes (Signed)
Utilization review completed.  

## 2014-07-07 NOTE — Progress Notes (Signed)
Pt received into room 2w28, pt assisted to bed, tele placed on pt, family at bedside, pt oriented to room and call bell, will continue to monitor pt Rickard Rhymes, RN

## 2014-07-07 NOTE — Progress Notes (Signed)
Physical Therapy Treatment Patient Details Name: Mary [name] MRN: [id number] DOB: 1923/02/14 Today's Date: 07/07/2014    History of Present Illness Patient is an 80 yo female s/p Right iliofemoral endarterectomy with bovine patch angioplasty and  Left to right femorofemoral bypass.    PT Comments    Patient demonstrates improved stability with use of Rw today. Ambulated increased distance but did require several rest breaks secondary to increased heart rate and body tremors (pt reports this happens when she gets tired). Patient tolerated some basic there ex and performed some self care activities during session with education on safety with activities. Will continue to see and progress as tolerated.  Follow Up Recommendations  Home health PT;Supervision/Assistance - 24 hour     Equipment Recommendations  None recommended by PT    Recommendations for Other Services       Precautions / Restrictions Precautions Precautions: Fall Restrictions Weight Bearing Restrictions: No    Mobility  Bed Mobility               General bed mobility comments: pt received up in chair  Transfers Overall transfer level: Needs assistance Equipment used: Rolling walker (2 wheeled) Transfers: Sit to/from Stand Sit to Stand: Min guard         General transfer comment: VCs for hand placement, no physical assist needed (performed from multiple surfaces)  Ambulation/Gait Ambulation/Gait assistance: Min guard Ambulation Distance (Feet): 220 Feet Assistive device: Rolling walker (2 wheeled) Gait Pattern/deviations: Step-through pattern;Decreased stride length;Drifts right/left;Trunk flexed;Narrow base of support Gait velocity: decreased Gait velocity interpretation: Below normal speed for age/gender General Gait Details: imprved stability, increased time to perform secondary to rest breaks for increased HR (tachicardic 146)   Stairs            Wheelchair Mobility     Modified Rankin (Stroke Patients Only)       Balance     Sitting balance-Leahy Scale: Good       Standing balance-Leahy Scale: Fair                      Cognition Arousal/Alertness: Awake/alert Behavior During Therapy: WFL for tasks assessed/performed Overall Cognitive Status: Within Functional Limits for tasks assessed (Pt not oriented to month stated August)                      Exercises General Exercises - Lower Extremity Ankle Circles/Pumps: Both;20 reps Long Arc Quad: 10 reps;Both Hip Flexion/Marching: 10 reps;Both    General Comments        Pertinent Vitals/Pain      Home Living                      Prior Function            PT Goals (current goals can now be found in the care plan section) Acute Rehab PT Goals Patient Stated Goal: Pt wants to get back to taking care of herself. PT Goal Formulation: With patient/family Time For Goal Achievement: 07/20/14 Potential to Achieve Goals: Good Progress towards PT goals: Progressing toward goals    Frequency  Min 3X/week    PT Plan Current plan remains appropriate    Co-evaluation             End of Session Equipment Utilized During Treatment: Gait belt Activity Tolerance: Patient tolerated treatment well;Patient limited by fatigue Patient left: in chair;with call bell/phone within reach;with family/visitor present  Time: 0712-1975 PT Time Calculation (min): 23 min  Charges:  $Gait Training: 8-22 mins $Therapeutic Activity: 8-22 mins                    G CodesDuncan Dull 07-10-2014, 3:12 PM Alben Deeds, [place] DPT  [phone]

## 2014-07-07 NOTE — Progress Notes (Addendum)
     Subjective  - Doing better today.  Pain better controlled today with PO medications.   Objective 133/48 88 98.4 F (36.9 C) (Oral) 33 100%  Intake/Output Summary (Last 24 hours) at 07/07/14 0734 Last data filed at 07/07/14 0000  Gross per 24 hour  Intake    350 ml  Output    350 ml  Net      0 ml   ABI: Right 0.39 Left 0.35  Doppler DP/PT right foot.  Foot pink and warm well perfused Groin incisions clean and dry. Dry 4 x 4 applied to groin incisions     Assessment/Planning: POD #2 PROCEDURE:  1. Right iliofemoral endarterectomy with bovine patch angioplasty  2. Left to right femorofemoral bypass  S/P lumbar surgery decompression 06/19/2014   Afebrile will draw labs in the am After pain medication at 0300 patient became combative and restraint mittens were placed.  -Severe malnutrition in the context of chronic illness  -Underweight  PT/OT recommends home health right now for discharge planning Transfer to 2W She has PT/DP dopplers on the right which has improved since yesterday.   [name] [facility] 07/07/2014 7:34 AM --  Laboratory Lab Results:  Recent Labs  07/05/14 1535 07/06/14 0341  WBC 10.6* 15.1*  HGB 9.3* 9.6*  HCT 28.2* 29.5*  PLT 345 368   BMET  Recent Labs  07/04/14 2349 07/05/14 1535 07/06/14 0341  NA 138  --  142  K 5.8*  --  4.2  CL 105  --  107  CO2 21  --  18*  GLUCOSE 102*  --  103*  BUN 22  --  8  CREATININE 0.91 0.72 0.73  CALCIUM 8.3*  --  7.7*    COAG Lab Results  Component Value Date   INR 1.15 07/04/2014   INR 1.08 03/18/2012   No results found for this basename: PTT     Addendum  I have independently interviewed and examined the patient, and I agree with the physician assistant's findings.  Both feet well perfused with strong signals.  Both groin c/d/i. Incision extend pass groin crease bilateral so efforts to avoid maceration will be needed.  ABI findings do not correlate with clinical findings,  will repeat as outpatient.  Both PT and OT recommend: home PT/OT with 24 hour assistance.  Check labs tomorrow.  HR ok yesterday.  Cont abx  - Ok to tsfr to floor - Home once ambulating better - Consult SCW to evaluate if 24 hour assistance is available, if not SNF will be needed  Adele Barthel, MD [facility] [place] Office: [phone] Pager: [phone]  07/07/2014, 8:11 AM

## 2014-07-08 LAB — BASIC METABOLIC PANEL
Anion gap: 15 (ref 5–15)
BUN: 9 mg/dL (ref 6–23)
CO2: 24 mEq/L (ref 19–32)
Calcium: 8.4 mg/dL (ref 8.4–10.5)
Chloride: 104 mEq/L (ref 96–112)
Creatinine, Ser: 0.65 mg/dL (ref 0.50–1.10)
GFR, EST AFRICAN AMERICAN: 89 mL/min — AB (ref >90)
GFR, EST NON AFRICAN AMERICAN: 77 mL/min — AB (ref >90)
Glucose, Bld: 101 mg/dL — ABNORMAL HIGH (ref 70–99)
Potassium: 3.7 mEq/L (ref 3.7–5.3)
Sodium: 143 mEq/L (ref 137–147)

## 2014-07-08 LAB — CBC
HEMATOCRIT: 29.7 % — AB (ref 36.0–46.0)
Hemoglobin: 9.7 g/dL — ABNORMAL LOW (ref 12.0–15.0)
MCH: 30.3 pg (ref 26.0–34.0)
MCHC: 32.7 g/dL (ref 30.0–36.0)
MCV: 92.8 fL (ref 78.0–100.0)
Platelets: 408 10*3/uL — ABNORMAL HIGH (ref 150–400)
RBC: 3.2 MIL/uL — ABNORMAL LOW (ref 3.87–5.11)
RDW: 15 % (ref 11.5–15.5)
WBC: 12.7 10*3/uL — ABNORMAL HIGH (ref 4.0–10.5)

## 2014-07-08 NOTE — Progress Notes (Addendum)
  [facility] Progress Note  07/08/2014 7:57 AM 3 Days Post-Op  Subjective:  Doing well today. Having minor pain that is well controlled.   Tmax 98.4 BP sys 120s-130s 02 95% RA  Filed Vitals:   07/08/14 0530  BP: 136/66  Pulse: 84  Temp: 98.4 F (36.9 C)  Resp: 20    Physical Exam: Incisions:  Bilateral groin incisions with minimal serous drainage. Gauze applied.  Extremities: Right foot with PT doppler signal.  Dry eschar over right 1st MT and great toe. No fluctuance or drainage. AT/PT doppler signals left foot.  CBC    Component Value Date/Time   WBC 12.7* 07/08/2014 0413   RBC 3.20* 07/08/2014 0413   RBC 3.22* 03/19/2012 0430   HGB 9.7* 07/08/2014 0413   HCT 29.7* 07/08/2014 0413   PLT 408* 07/08/2014 0413   MCV 92.8 07/08/2014 0413   MCH 30.3 07/08/2014 0413   MCHC 32.7 07/08/2014 0413   RDW 15.0 07/08/2014 0413    BMET    Component Value Date/Time   NA 143 07/08/2014 0413   K 3.7 07/08/2014 0413   CL 104 07/08/2014 0413   CO2 24 07/08/2014 0413   GLUCOSE 101* 07/08/2014 0413   BUN 9 07/08/2014 0413   CREATININE 0.65 07/08/2014 0413   CALCIUM 8.4 07/08/2014 0413   GFRNONAA 77* 07/08/2014 0413   GFRAA 89* 07/08/2014 0413    INR    Component Value Date/Time   INR 1.15 07/04/2014 2349     Intake/Output Summary (Last 24 hours) at 07/08/14 0757 Last data filed at 07/08/14 0626  Gross per 24 hour  Intake 1172.33 ml  Output   1050 ml  Net 122.33 ml     Assessment:  80 y.o. female is s/p:  1.Right iliofemoral endarterectomy with bovine patch angioplasty  2. Left to right femorofemoral bypass   3 Days Post-Op  Plan: -Groin incisions healing well. Continue meticulous wound care and apply dry gauze. -Leukocytosis: trending down today to 12.7. Was 15.1 on 8/27. Afebrile. Will continue to monitor.  -Hgb stable  -Creatine stable -Continue nutrition supplement shakes  -Continue mobilization. Ambulation improving.  -DVT prophylaxis:   Lovenox -Dispo: Patient requests SNF due to lack of care at home. CSW following. Anticipate d/c in next 1-2 days.    Virgina Jock, PA-C [facility] Office: [phone] Pager: [phone] 07/08/2014 7:57 AM     I have examined the patient, reviewed and agree with above. Comfortable up in chair. Groins healing nicely.  Adib Wahba, MD 07/08/2014 11:18 AM

## 2014-07-09 NOTE — Progress Notes (Addendum)
  [facility] Progress Note  07/09/2014 8:16 AM 4 Days Post-Op  Subjective:  Continuing to do well. No complaints.  Tmax 99 BP sys 110s-130s 02 96% 2L  Filed Vitals:   07/09/14 0538  BP: 134/52  Pulse: 86  Temp: 98.8 F (37.1 C)  Resp: 18    Physical Exam: Incisions:  Bilateral groin incisions are clean, dry and intact. Extremities:  DP/PT doppler signals bilaterally.   CBC    Component Value Date/Time   WBC 12.7* 07/08/2014 0413   RBC 3.20* 07/08/2014 0413   RBC 3.22* 03/19/2012 0430   HGB 9.7* 07/08/2014 0413   HCT 29.7* 07/08/2014 0413   PLT 408* 07/08/2014 0413   MCV 92.8 07/08/2014 0413   MCH 30.3 07/08/2014 0413   MCHC 32.7 07/08/2014 0413   RDW 15.0 07/08/2014 0413    BMET    Component Value Date/Time   NA 143 07/08/2014 0413   K 3.7 07/08/2014 0413   CL 104 07/08/2014 0413   CO2 24 07/08/2014 0413   GLUCOSE 101* 07/08/2014 0413   BUN 9 07/08/2014 0413   CREATININE 0.65 07/08/2014 0413   CALCIUM 8.4 07/08/2014 0413   GFRNONAA 77* 07/08/2014 0413   GFRAA 89* 07/08/2014 0413    INR    Component Value Date/Time   INR 1.15 07/04/2014 2349     Intake/Output Summary (Last 24 hours) at 07/09/14 0816 Last data filed at 07/08/14 0909  Gross per 24 hour  Intake    240 ml  Output    350 ml  Net   -110 ml     Assessment:  80 y.o. female is s/p:  1.Right iliofemoral endarterectomy with bovine patch angioplasty  2. Left to right femorofemoral bypass   4 Days Post-Op  Plan: -Groins healing well. Keep dry.  -Continue ambulation -Will check labs in am -DVT prophylaxis:  Lovenox -Dispo: Anticipate discharge to SNF tomorrow    Virgina Jock, PA-C [facility] Office: [phone] Pager: [phone] 07/09/2014 8:16 AM     I have examined the patient, reviewed and agree with above. Groin incisions healing nicely. Palpable fem-fem graft pulse.  Matea Stanard, MD 07/09/2014 9:37 AM

## 2014-07-09 NOTE — Discharge Summary (Signed)
[facility] Discharge Summary  Mary [name] Apr 03, 1923 80 y.o. female  [id number]  Admission Date: 07/03/2014  Discharge Date: 07/10/2014  Physician: Conrad [place], MD  Admission Diagnosis: RLE critical limb ischemia, mild-moderate LLE PAD  Discharge Diagnoses: 1. Acute on chronic right critical limb ischemia  2. Mild-moderate LLE peripheral arterial disease  3. Protein malnutrition 4. Bradycardia due to bund branch block   HPI:   This is a 80 y.o. female who presented with chief complaint: right leg and foot pain. Onset of symptom occurred January 15 but has worsened since April. Pain is described as sharp and crampy, severity 3-6/10, and associated with short distance ambulation and occasionally at rest. Patient has attempted to treat this pain with rest. The patient has intermittent rest pain symptoms also and few ischemic areas on R foot for tight shoe. Atherosclerotic risk factors include: HTN, former smoker.   Hospital Course:  The patient was admitted to the hospital and taken to the operating room on 07/03/2014 and underwent aortogram with right leg runoff.  On  07/05/2014 she underwent right iliofemoral endarterectomy with bovine patch angioplasty and left to right femorofemoral bypass.  The patients tolerated the procedures well and was transported to the PACU in stable condition. She had easily dopplerable right PT signal.   On POD 1, after administration of pain medication became combative and restraint mittens were placed. She had previous back surgery on 06/19/14 and there was serous drainage. She was afebrile but with elevation in WBC so she was started on ancef. She was bradycardic preoperatively but became tachycardic with mobilization. Her BP was stable. She was kept in the stepdown unit. She continued to have a dopplerable right PT signal. She was given nutrition supplements for severe malnutrition.   On POD 2, her pain was better and she was  transferred to the floor. Her groin incisions were clean and dry. Her lumbar dressing had some yellow drainage but without purulence.   On POD 3, her leukocytosis decreased to 12.7. She was afebrile. Her creatinine and hemoglobin were both stable. She remained stable on POD 4. DP/PT doppler signals were heard bilaterally. She had been ambulating in her room. Her groin incisions continued to heal well. She desired to go to SNF due to lack of care at home. Admissions were not available during the weekend, so she was kept in the hospital until POD 5.   On POD 5, she continued to improve and was ready for discharge. Her foot wounds remained stable and her ABIs did not correlate with her actual physical exam. She had good doppler flow bilaterally. She was discharged to SNF on POD 5 in good condition. She was continued on oral antibiotics for her back incision.   CBC    Component Value Date/Time   WBC 12.7* 07/08/2014 0413   RBC 3.20* 07/08/2014 0413   RBC 3.22* 03/19/2012 0430   HGB 9.7* 07/08/2014 0413   HCT 29.7* 07/08/2014 0413   PLT 408* 07/08/2014 0413   MCV 92.8 07/08/2014 0413   MCH 30.3 07/08/2014 0413   MCHC 32.7 07/08/2014 0413   RDW 15.0 07/08/2014 0413    BMET    Component Value Date/Time   NA 143 07/08/2014 0413   K 3.7 07/08/2014 0413   CL 104 07/08/2014 0413   CO2 24 07/08/2014 0413   GLUCOSE 101* 07/08/2014 0413   BUN 9 07/08/2014 0413   CREATININE 0.65 07/08/2014 0413   CALCIUM 8.4 07/08/2014 0413   GFRNONAA  77* 07/08/2014 0413   GFRAA 89* 07/08/2014 0413     Discharge Instructions:   The patient is discharged to SNF with extensive instructions on wound care and progressive ambulation. They are instructed not to drive or perform any heavy lifting until returning to see the physician in his office.   Discharge Diagnosis:  pvd  Secondary Diagnosis: Patient Active Problem List   Diagnosis Date Noted  . PAD (peripheral artery disease) 07/05/2014  . Atherosclerotic PVD with  ulceration 07/04/2014  . Hypokalemia 07/04/2014  . Hyposmolality and/or hyponatremia 07/04/2014  . Former smoker 07/04/2014  . Rate-related bundle branch block 07/04/2014  . Pain 07/03/2014  . Critical lower limb ischemia 06/30/2014  . Acute posthemorrhagic anemia 03/22/2012  . Post-polypectomy bleeding 03/20/2012  . Gastric ulcer, acute 03/19/2012  . Colon polyps 03/19/2012  . Colon ulcer 03/19/2012  . GI bleed 03/18/2012  . Anemia 03/18/2012  . Pneumonia 03/18/2012  . HTN (hypertension) 03/18/2012  . H/O colon cancer, stage I 03/18/2012  . Near syncope 03/18/2012   Past Medical History  Diagnosis Date  . Hypertension   . Osteoporosis   . Pneumonia   . Anemia   . Arthritis   . Cancer     colon cancer  . Peripheral vascular disease   . Rate-related bundle branch block 07/04/2014       Medication List         CALCIUM + D PO  Take 1 tablet by mouth 2 (two) times daily.     HYDROcodone-acetaminophen 5-325 MG per tablet  Commonly known as:  NORCO/VICODIN  Take 1 tablet by mouth every 6 (six) hours as needed for moderate pain.     losartan-hydrochlorothiazide 100-25 MG per tablet  Commonly known as:  HYZAAR  Take 1 tablet by mouth daily.     metoprolol succinate 50 MG 24 hr tablet  Commonly known as:  TOPROL-XL  Take 50 mg by mouth daily. Take with or immediately following a meal.     Red Yeast Rice 600 MG Tabs  Take 1 tablet by mouth daily.       Prescriptions Percocet #20 No Refill Keflex 500mg   Disposition: SNF  Patient's condition: is Good  Follow up: 1. Dr. Bridgett Larsson in 2 weeks   Virgina Jock, PA-C [facility] [phone] 07/09/2014  9:10 AM  Addendum  I have independently interviewed and examined the patient, and I agree with the physician assistant's findings.  This patient underwent an uneventful right iliofemoral endarterectomy with bovine patch angioplasty for severe calcific right iliofemoral atherosclerosis and right  superficial femoral artery calcific atherosclerosis.  He post-operative course has been relatively uneventful.  Her right foot demonstrates marked improvement with high probability of right foot salvage without any additional surgery.  Her hospital ABI are not consistent with her clinical findings.  The patient will be transferred to a SNF for continued rehab.  I will have he come back in 2 weeks for recheck on her groin incisions which have been clean and intact to date.  Adele Barthel, MD [facility] [place] Office: [phone] Pager: [phone]  07/10/2014, 2:09 PM    - For VQI Registry use --- Instructions: Press F2 to tab through selections.  Delete question if not applicable.   Post-op:  Wound infection: No  Graft infection: No  Transfusion: No   New Arrhythmia: No Ipsilateral amputation: No, [ ]  Minor, [ ]  BKA, [ ]  AKA Discharge patency: [ ]  Primary, [x]  Primary assisted, [ ]   Secondary, [ ]  Occluded Patency judged by: [ ]  Dopper only, [x ] Palpable graft pulse, [ ]  Palpable distal pulse, [ ]  ABI inc. > 0.15, [ ]  Duplex Discharge ABI: R 0.39, L 0.35 D/C Ambulatory Status: Ambulatory  Complications: MI: No, [ ]  Troponin only, [ ]  EKG or Clinical CHF: No Resp failure:No, [ ]  Pneumonia, [ ]  Ventilator Chg in renal function: No, [ ]  Inc. Cr > 0.5, [ ]  Temp. Dialysis, [ ]  Permanent dialysis Stroke: No, [ ]  Minor, [ ]  Major Return to OR: No   Discharge medications: Statin use:  no ASA use:  yes Plavix use:  no Beta blocker use: no Coumadin use: no

## 2014-07-10 ENCOUNTER — Telehealth: Payer: Self-pay | Admitting: Vascular Surgery

## 2014-07-10 DIAGNOSIS — M129 Arthropathy, unspecified: Secondary | ICD-10-CM | POA: Diagnosis not present

## 2014-07-10 DIAGNOSIS — Z9889 Other specified postprocedural states: Secondary | ICD-10-CM | POA: Diagnosis not present

## 2014-07-10 DIAGNOSIS — R293 Abnormal posture: Secondary | ICD-10-CM | POA: Diagnosis not present

## 2014-07-10 DIAGNOSIS — Z5189 Encounter for other specified aftercare: Secondary | ICD-10-CM | POA: Diagnosis not present

## 2014-07-10 DIAGNOSIS — E785 Hyperlipidemia, unspecified: Secondary | ICD-10-CM | POA: Diagnosis not present

## 2014-07-10 DIAGNOSIS — I10 Essential (primary) hypertension: Secondary | ICD-10-CM | POA: Diagnosis not present

## 2014-07-10 DIAGNOSIS — I739 Peripheral vascular disease, unspecified: Secondary | ICD-10-CM | POA: Diagnosis not present

## 2014-07-10 DIAGNOSIS — I4510 Unspecified right bundle-branch block: Secondary | ICD-10-CM | POA: Diagnosis not present

## 2014-07-10 DIAGNOSIS — M6281 Muscle weakness (generalized): Secondary | ICD-10-CM | POA: Diagnosis not present

## 2014-07-10 DIAGNOSIS — E876 Hypokalemia: Secondary | ICD-10-CM | POA: Diagnosis not present

## 2014-07-10 LAB — BASIC METABOLIC PANEL WITH GFR
Anion gap: 12 (ref 5–15)
BUN: 12 mg/dL (ref 6–23)
CO2: 24 meq/L (ref 19–32)
Calcium: 8.5 mg/dL (ref 8.4–10.5)
Chloride: 101 meq/L (ref 96–112)
Creatinine, Ser: 0.6 mg/dL (ref 0.50–1.10)
GFR calc Af Amer: >90 mL/min
GFR calc non Af Amer: 79 mL/min — ABNORMAL LOW
Glucose, Bld: 96 mg/dL (ref 70–99)
Potassium: 4 meq/L (ref 3.7–5.3)
Sodium: 137 meq/L (ref 137–147)

## 2014-07-10 LAB — CBC
HCT: 27.7 % — ABNORMAL LOW (ref 36.0–46.0)
Hemoglobin: 9.1 g/dL — ABNORMAL LOW (ref 12.0–15.0)
MCH: 30.8 pg (ref 26.0–34.0)
MCHC: 32.9 g/dL (ref 30.0–36.0)
MCV: 93.9 fL (ref 78.0–100.0)
Platelets: 457 10*3/uL — ABNORMAL HIGH (ref 150–400)
RBC: 2.95 MIL/uL — ABNORMAL LOW (ref 3.87–5.11)
RDW: 14.9 % (ref 11.5–15.5)
WBC: 11.1 10*3/uL — ABNORMAL HIGH (ref 4.0–10.5)

## 2014-07-10 MED ORDER — ASPIRIN 81 MG PO CHEW: 81.0000 mg | CHEWABLE_TABLET | Freq: Every day | ORAL | Status: DC

## 2014-07-10 MED ORDER — LABETALOL HCL 5 MG/ML IV SOLN
10.0000 mg | INTRAVENOUS | Status: DC | PRN
  Filled 2014-07-10: qty 4

## 2014-07-10 MED ORDER — OXYCODONE-ACETAMINOPHEN 5-325 MG PO TABS: 1.0000 | ORAL_TABLET | Freq: Four times a day (QID) | ORAL | Status: DC | PRN

## 2014-07-10 MED ORDER — CEPHALEXIN 500 MG PO CAPS: 500.0000 mg | ORAL_CAPSULE | Freq: Four times a day (QID) | ORAL | Status: DC

## 2014-07-10 MED ORDER — HYDRALAZINE HCL 20 MG/ML IJ SOLN: 10.0000 mg | INTRAMUSCULAR | Status: DC | PRN

## 2014-07-10 NOTE — Telephone Encounter (Signed)
Message copied by Gena Fray on Mon Jul 10, 2014  3:04 PM ------      Message from: Peter Minium K      Created: Mon Jul 10, 2014  1:58 PM      Regarding: Schedule                   ----- Message -----         From: Alvia Grove, PA-C         Sent: 07/10/2014  11:40 AM           To: [facility] Charge Pool            s/p      1.  Right iliofemoral endarterectomy with bovine patch angioplasty      2.  Left to right femorofemoral bypass      On 07/05/14            F/u with Dr. Bridgett Larsson in 2 weeks.            Thanks,       Maudie Mercury ------

## 2014-07-10 NOTE — Progress Notes (Signed)
07/10/2014 1330 Nursing note Discharge avs form, medications already taken today and those due this evening given and explained to patient and sister. Follow up appointments, activity restrictions and when to call MD reviewed. Attempted to call report to [facility]. Sent to voicemail. Unable to call report. D/c iv line. D/c tele. D/c snf per orders. Transported to facility by family.  Eda Magnussen, [place]

## 2014-07-10 NOTE — Progress Notes (Signed)
   Daily Progress Note  Assessment/Planning: POD #5 s/p s/p R iliofem EA w/ BPA, L to R fem-fem BPG   Ok to transfer to SNF  H/H stable and ok  Would finish an abx course as precaution  Subjective  - 5 Days Post-Op  No complains  Objective Filed Vitals:   07/09/14 0538 07/09/14 1400 07/09/14 2030 07/10/14 0448  BP: 134/52 134/53 128/53 158/63  Pulse: 86 91 87 91  Temp: 98.8 F (37.1 C) 98.5 F (36.9 C) 98.9 F (37.2 C) 99.5 F (37.5 C)  TempSrc: Oral Oral Oral Oral  Resp: 18 18 18 18   Height:      Weight:      SpO2: 96% 100% 99% 97%    Intake/Output Summary (Last 24 hours) at 07/10/14 0923 Last data filed at 07/10/14 0740  Gross per 24 hour  Intake    480 ml  Output      0 ml  Net    480 ml    PULM  CTAB CV  RRR GI  soft, NTND VASC  B feet warm and perfused  Laboratory CBC    Component Value Date/Time   WBC 11.1* 07/10/2014 0540   HGB 9.1* 07/10/2014 0540   HCT 27.7* 07/10/2014 0540   PLT 457* 07/10/2014 0540    BMET    Component Value Date/Time   NA 137 07/10/2014 0540   K 4.0 07/10/2014 0540   CL 101 07/10/2014 0540   CO2 24 07/10/2014 0540   GLUCOSE 96 07/10/2014 0540   BUN 12 07/10/2014 0540   CREATININE 0.60 07/10/2014 0540   CALCIUM 8.5 07/10/2014 0540   GFRNONAA 79* 07/10/2014 0540   GFRAA >90 07/10/2014 0540    Adele Barthel, MD [facility] [place] Office: [phone] Pager: [phone]  07/10/2014, 9:23 AM

## 2014-07-10 NOTE — Telephone Encounter (Signed)
Lm for pt re appt, dpm  °

## 2014-07-10 NOTE — Clinical Social Work Note (Signed)
Clinical Social Worker facilitated patient discharge including contacting patient family and facility to confirm patient discharge plans.  Clinical information faxed to facility and family agreeable with plan.  CSW arranged transport via family to [facility].  RN to call report prior to discharge.  Clinical Social Worker will sign off for now as social work intervention is no longer needed. Please consult [place] again if new need arises.  Barbette Or, [place]

## 2014-07-10 NOTE — Progress Notes (Signed)
Occupational Therapy Treatment Patient Details Name: Mary [name] MRN: [id number] DOB: 01/03/1923 Today's Date: 07/10/2014    History of present illness Patient is an 80 yo female s/p Right iliofemoral endarterectomy with bovine patch angioplasty and  Left to right femorofemoral bypass.   OT comments  Pt making progress with functional goals ad should continue with acute OT services to increase level of function and safety  Follow Up Recommendations  Supervision/Assistance - 24 hour;SNF (pt planning to d/c to SNF for rehab)    Equipment Recommendations  Other (comment) (TBD at next level of care)    Recommendations for Other Services      Precautions / Restrictions Precautions Precautions: Fall Restrictions Weight Bearing Restrictions: No       Mobility Bed Mobility Overal bed mobility: Modified Independent                Transfers Overall transfer level: Needs assistance Equipment used: Rolling walker (2 wheeled) Transfers: Sit to/from Stand Sit to Stand: Min guard         General transfer comment: VCs for hand placement    Balance Overall balance assessment: Needs assistance Sitting-balance support: No upper extremity supported;Feet supported Sitting balance-Leahy Scale: Good     Standing balance support: Single extremity supported;During functional activity Standing balance-Leahy Scale: Fair                     ADL Overall ADL's : Needs assistance/impaired     Grooming: Wash/dry hands;Standing;Min guard;Wash/dry face       Lower Body Bathing: Minimal assistance;Sit to/from stand;Min guard       Lower Body Dressing: Minimal assistance;Min guard   Toilet Transfer: Comfort height toilet;Min guard Toilet Transfer Details (indicate cue type and reason): VCs for hand placement Toileting- Clothing Manipulation and Hygiene: Sit to/from stand;Min guard   Tub/ Shower Transfer: Min guard;Minimal assistance;3 in 1   Functional mobility  during ADLs: Min guard General ADL Comments: pt planning to d/c to SNFfor short term rehab      Vision  wears reading glasses                   Perception Perception Perception Tested?: No   Praxis Praxis Praxis tested?: Within functional limits    Cognition   Behavior During Therapy: WFL for tasks assessed/performed Overall Cognitive Status: Within Functional Limits for tasks assessed                                                 General Comments  Pt very pleasant and cooperative    Pertinent Vitals/ Pain       Pain Assessment: No/denies pain                                                          Frequency Min 2X/week     Progress Toward Goals  OT Goals(current goals can now be found in the care plan section)  Progress towards OT goals: Progressing toward goals     Plan Discharge plan needs to be updated  End of Session     Activity Tolerance Patient tolerated treatment well   Patient Left with call bell/phone within reach;with family/visitor present;in bed             Time: 7846-9629 OT Time Calculation (min): 11 min  Charges: OT General Charges $OT Visit: 1 Procedure OT Treatments $Self Care/Home Management : 8-22 mins  Britt Bottom 07/10/2014, 12:50 PM

## 2014-07-11 ENCOUNTER — Ambulatory Visit: Payer: Medicare Other | Admitting: Cardiology

## 2014-07-17 DIAGNOSIS — I739 Peripheral vascular disease, unspecified: Secondary | ICD-10-CM | POA: Diagnosis not present

## 2014-07-26 DIAGNOSIS — I10 Essential (primary) hypertension: Secondary | ICD-10-CM | POA: Diagnosis not present

## 2014-07-26 DIAGNOSIS — Z85038 Personal history of other malignant neoplasm of large intestine: Secondary | ICD-10-CM | POA: Diagnosis not present

## 2014-07-26 DIAGNOSIS — Z48812 Encounter for surgical aftercare following surgery on the circulatory system: Secondary | ICD-10-CM | POA: Diagnosis not present

## 2014-07-26 DIAGNOSIS — Z4801 Encounter for change or removal of surgical wound dressing: Secondary | ICD-10-CM | POA: Diagnosis not present

## 2014-07-26 DIAGNOSIS — Z4800 Encounter for change or removal of nonsurgical wound dressing: Secondary | ICD-10-CM | POA: Diagnosis not present

## 2014-07-26 DIAGNOSIS — R262 Difficulty in walking, not elsewhere classified: Secondary | ICD-10-CM | POA: Diagnosis not present

## 2014-07-26 DIAGNOSIS — I739 Peripheral vascular disease, unspecified: Secondary | ICD-10-CM | POA: Diagnosis not present

## 2014-07-26 DIAGNOSIS — L89159 Pressure ulcer of sacral region, unspecified stage: Secondary | ICD-10-CM | POA: Diagnosis not present

## 2014-07-27 ENCOUNTER — Encounter: Payer: Self-pay | Admitting: Vascular Surgery

## 2014-07-28 ENCOUNTER — Encounter: Payer: Self-pay | Admitting: Vascular Surgery

## 2014-07-28 ENCOUNTER — Ambulatory Visit (INDEPENDENT_AMBULATORY_CARE_PROVIDER_SITE_OTHER): Payer: Medicare Other | Admitting: Vascular Surgery

## 2014-07-28 VITALS — BP 130/57 | HR 67 | Ht 60.0 in | Wt 97.8 lb

## 2014-07-28 DIAGNOSIS — Z4801 Encounter for change or removal of surgical wound dressing: Secondary | ICD-10-CM | POA: Diagnosis not present

## 2014-07-28 DIAGNOSIS — Z48812 Encounter for surgical aftercare following surgery on the circulatory system: Secondary | ICD-10-CM | POA: Diagnosis not present

## 2014-07-28 DIAGNOSIS — Z4800 Encounter for change or removal of nonsurgical wound dressing: Secondary | ICD-10-CM | POA: Diagnosis not present

## 2014-07-28 DIAGNOSIS — I739 Peripheral vascular disease, unspecified: Secondary | ICD-10-CM | POA: Diagnosis not present

## 2014-07-28 DIAGNOSIS — L89159 Pressure ulcer of sacral region, unspecified stage: Secondary | ICD-10-CM | POA: Diagnosis not present

## 2014-07-28 DIAGNOSIS — I998 Other disorder of circulatory system: Secondary | ICD-10-CM

## 2014-07-28 DIAGNOSIS — I70229 Atherosclerosis of native arteries of extremities with rest pain, unspecified extremity: Secondary | ICD-10-CM

## 2014-07-28 DIAGNOSIS — I999 Unspecified disorder of circulatory system: Secondary | ICD-10-CM

## 2014-07-28 DIAGNOSIS — I10 Essential (primary) hypertension: Secondary | ICD-10-CM | POA: Diagnosis not present

## 2014-07-28 NOTE — Progress Notes (Signed)
POST OPERATIVE OFFICE NOTE    CC:  F/u for surgery  HPI:  This is a 80 y.o. female who is s/p right iliofemoral endarterectomy with bovine patch angioplasty and left to right femorofemoral bypass on 07/05/14 by Dr. Bridgett Larsson.  She states that she is doing well and HH is changing her dressing in her groin to keep them dry.  She went home from the SNF on Tuesday.  She states that she has a spot on her right incision that has been draining a little bit.  She is no longer taking the ABx.   No Known Allergies  Current Outpatient Prescriptions  Medication Sig Dispense Refill  . aspirin 81 MG chewable tablet Chew 1 tablet (81 mg total) by mouth daily.      . Calcium Carbonate-Vitamin D (CALCIUM + D PO) Take 1 tablet by mouth 2 (two) times daily.      . cephALEXin (KEFLEX) 500 MG capsule Take 1 capsule (500 mg total) by mouth 4 (four) times daily.  20 capsule  0  . HYDROcodone-acetaminophen (NORCO/VICODIN) 5-325 MG per tablet Take 1 tablet by mouth every 6 (six) hours as needed for moderate pain.      Marland Kitchen losartan-hydrochlorothiazide (HYZAAR) 100-25 MG per tablet Take 1 tablet by mouth daily.      . metoprolol succinate (TOPROL-XL) 50 MG 24 hr tablet Take 50 mg by mouth daily. Take with or immediately following a meal.      . oxyCODONE-acetaminophen (PERCOCET/ROXICET) 5-325 MG per tablet Take 1-2 tablets by mouth every 6 (six) hours as needed for moderate pain.  20 tablet  0  . Red Yeast Rice 600 MG TABS Take 1 tablet by mouth daily.       No current facility-administered medications for this visit.     ROS:  See HPI  Physical Exam:  Filed Vitals:   07/28/14 1352  BP: 130/57  Pulse: 67    Incision:  Right groin incision with a small area of superficial, fibrinous exudate.  It does not track down with probing with a cotton tip applicator.  Her left groin is healing nicely. Extremities:  + palpable DP pulses bilaterally   A/P:  This is a 80 y.o. female here for f/u for right iliofemoral  endarterectomy with bovine patch angioplasty and left to right femorofemoral bypass grafting 07/05/14 by Dr. Bridgett Larsson.  -She does have an area of superficial fat necrosis that does not track deep with cotton tip applicator.  Dr. Bridgett Larsson debrided this area.  Will start with wet to dry dressing changes in the right groin.  Continue to keep the left groin dry.   -will have her f/u in one week with wound check.   -she already has [place] ordered, but will modify the order to have wet to dry dressing changes bid   Leontine Locket, PA-C [facility] [phone]  Clinic MD:  Pt seen and examined with Dr. Bridgett Larsson  Addendum  I have independently interviewed and examined the patient, and I agree with the physician assistant's findings.  Small amt of dried fat in inferior portion of R groin incision.  This is purely superficial with no track deeper.  I debrided the dissicated fat and placed a wet-to-dry dressing.  I have modified her home health order to daily wound care to R groin.  She will follow up for re-eval in one week.  Adele Barthel, MD [facility] [place] Office: [phone] Pager: [phone]  07/28/2014, 3:04 PM

## 2014-07-31 DIAGNOSIS — I10 Essential (primary) hypertension: Secondary | ICD-10-CM | POA: Diagnosis not present

## 2014-07-31 DIAGNOSIS — Z4800 Encounter for change or removal of nonsurgical wound dressing: Secondary | ICD-10-CM | POA: Diagnosis not present

## 2014-07-31 DIAGNOSIS — L89159 Pressure ulcer of sacral region, unspecified stage: Secondary | ICD-10-CM | POA: Diagnosis not present

## 2014-07-31 DIAGNOSIS — Z48812 Encounter for surgical aftercare following surgery on the circulatory system: Secondary | ICD-10-CM | POA: Diagnosis not present

## 2014-07-31 DIAGNOSIS — Z23 Encounter for immunization: Secondary | ICD-10-CM | POA: Diagnosis not present

## 2014-07-31 DIAGNOSIS — Z4801 Encounter for change or removal of surgical wound dressing: Secondary | ICD-10-CM | POA: Diagnosis not present

## 2014-07-31 DIAGNOSIS — I739 Peripheral vascular disease, unspecified: Secondary | ICD-10-CM | POA: Diagnosis not present

## 2014-08-02 DIAGNOSIS — Z48812 Encounter for surgical aftercare following surgery on the circulatory system: Secondary | ICD-10-CM | POA: Diagnosis not present

## 2014-08-02 DIAGNOSIS — I739 Peripheral vascular disease, unspecified: Secondary | ICD-10-CM | POA: Diagnosis not present

## 2014-08-02 DIAGNOSIS — L89159 Pressure ulcer of sacral region, unspecified stage: Secondary | ICD-10-CM | POA: Diagnosis not present

## 2014-08-02 DIAGNOSIS — Z4800 Encounter for change or removal of nonsurgical wound dressing: Secondary | ICD-10-CM | POA: Diagnosis not present

## 2014-08-02 DIAGNOSIS — I10 Essential (primary) hypertension: Secondary | ICD-10-CM | POA: Diagnosis not present

## 2014-08-02 DIAGNOSIS — Z4801 Encounter for change or removal of surgical wound dressing: Secondary | ICD-10-CM | POA: Diagnosis not present

## 2014-08-03 ENCOUNTER — Encounter: Payer: Self-pay | Admitting: Vascular Surgery

## 2014-08-04 ENCOUNTER — Ambulatory Visit (INDEPENDENT_AMBULATORY_CARE_PROVIDER_SITE_OTHER): Payer: Medicare Other | Admitting: Vascular Surgery

## 2014-08-04 ENCOUNTER — Encounter: Payer: Self-pay | Admitting: Vascular Surgery

## 2014-08-04 VITALS — BP 153/79 | HR 55 | Resp 16 | Ht 61.0 in | Wt 98.0 lb

## 2014-08-04 DIAGNOSIS — I999 Unspecified disorder of circulatory system: Secondary | ICD-10-CM

## 2014-08-04 DIAGNOSIS — I998 Other disorder of circulatory system: Secondary | ICD-10-CM

## 2014-08-04 DIAGNOSIS — I70229 Atherosclerosis of native arteries of extremities with rest pain, unspecified extremity: Secondary | ICD-10-CM

## 2014-08-04 MED ORDER — COLLAGENASE 250 UNIT/GM EX OINT: 1.0000 "application " | TOPICAL_OINTMENT | Freq: Every day | CUTANEOUS | Status: DC

## 2014-08-04 NOTE — Progress Notes (Signed)
Talked to Eustaquio Maize [facility],The nurse at [facility], She will get [facility] tomorrow and send a nurse out to Mary [name]'s house to do wound teaching and apply Santly /wet to dry dressing as per Dr. Lianne Moris orders today.  I called patient and told her to expect [facility] nurse tomorrow.

## 2014-08-04 NOTE — Progress Notes (Signed)
   POST OPERATIVE OFFICE NOTE   CC: F/u for surgery   HPI: This is a 80 y.o. female who is s/p right iliofemoral endarterectomy with bovine patch angioplasty and left to right femorofemoral bypass on 07/05/14 by Dr. Bridgett Larsson. Pt denies fever or chills.  She denies drainage from right groin.  She can't seem to remember if they are doing daily dressing changes on the right groin.    Physical Exam:   Filed Vitals:   08/04/14 1132  BP: 153/79  Pulse: 55  Resp: 16  Height: 5\' 1"  (1.549 m)  Weight: 98 lb (44.453 kg)   R groin: area of superficial separation somewhat larger with fibrinous exudate, good granulation tissue is evident underneath  Extremities: + palpable DP pulses bilaterally   A/P: This is a 80 y.o. female here for f/u for right iliofemoral endarterectomy with bovine patch angioplasty and left to right femorofemoral bypass grafting 07/05/14 by Dr. Bridgett Larsson.   - Santyl ointment to R groin daily with wet-to-dry dressing on top daily - Follow up in one week - After cleaning up the incision with Santyl, I expect this patient to heal up easily  Mary Barthel, MD [facility] [place] Office: [phone] Pager: [phone]  08/04/2014, 11:55 AM

## 2014-08-05 DIAGNOSIS — Z4800 Encounter for change or removal of nonsurgical wound dressing: Secondary | ICD-10-CM | POA: Diagnosis not present

## 2014-08-05 DIAGNOSIS — I10 Essential (primary) hypertension: Secondary | ICD-10-CM | POA: Diagnosis not present

## 2014-08-05 DIAGNOSIS — L89159 Pressure ulcer of sacral region, unspecified stage: Secondary | ICD-10-CM | POA: Diagnosis not present

## 2014-08-05 DIAGNOSIS — Z48812 Encounter for surgical aftercare following surgery on the circulatory system: Secondary | ICD-10-CM | POA: Diagnosis not present

## 2014-08-05 DIAGNOSIS — Z4801 Encounter for change or removal of surgical wound dressing: Secondary | ICD-10-CM | POA: Diagnosis not present

## 2014-08-05 DIAGNOSIS — I739 Peripheral vascular disease, unspecified: Secondary | ICD-10-CM | POA: Diagnosis not present

## 2014-08-06 DIAGNOSIS — L89159 Pressure ulcer of sacral region, unspecified stage: Secondary | ICD-10-CM | POA: Diagnosis not present

## 2014-08-06 DIAGNOSIS — Z4801 Encounter for change or removal of surgical wound dressing: Secondary | ICD-10-CM | POA: Diagnosis not present

## 2014-08-06 DIAGNOSIS — I739 Peripheral vascular disease, unspecified: Secondary | ICD-10-CM | POA: Diagnosis not present

## 2014-08-06 DIAGNOSIS — Z4800 Encounter for change or removal of nonsurgical wound dressing: Secondary | ICD-10-CM | POA: Diagnosis not present

## 2014-08-06 DIAGNOSIS — Z48812 Encounter for surgical aftercare following surgery on the circulatory system: Secondary | ICD-10-CM | POA: Diagnosis not present

## 2014-08-06 DIAGNOSIS — I10 Essential (primary) hypertension: Secondary | ICD-10-CM | POA: Diagnosis not present

## 2014-08-07 DIAGNOSIS — I739 Peripheral vascular disease, unspecified: Secondary | ICD-10-CM | POA: Diagnosis not present

## 2014-08-07 DIAGNOSIS — Z48812 Encounter for surgical aftercare following surgery on the circulatory system: Secondary | ICD-10-CM | POA: Diagnosis not present

## 2014-08-07 DIAGNOSIS — Z4800 Encounter for change or removal of nonsurgical wound dressing: Secondary | ICD-10-CM | POA: Diagnosis not present

## 2014-08-07 DIAGNOSIS — L89159 Pressure ulcer of sacral region, unspecified stage: Secondary | ICD-10-CM | POA: Diagnosis not present

## 2014-08-07 DIAGNOSIS — I10 Essential (primary) hypertension: Secondary | ICD-10-CM | POA: Diagnosis not present

## 2014-08-07 DIAGNOSIS — Z4801 Encounter for change or removal of surgical wound dressing: Secondary | ICD-10-CM | POA: Diagnosis not present

## 2014-08-08 DIAGNOSIS — Z48812 Encounter for surgical aftercare following surgery on the circulatory system: Secondary | ICD-10-CM | POA: Diagnosis not present

## 2014-08-08 DIAGNOSIS — Z4800 Encounter for change or removal of nonsurgical wound dressing: Secondary | ICD-10-CM | POA: Diagnosis not present

## 2014-08-08 DIAGNOSIS — L89159 Pressure ulcer of sacral region, unspecified stage: Secondary | ICD-10-CM | POA: Diagnosis not present

## 2014-08-08 DIAGNOSIS — Z4801 Encounter for change or removal of surgical wound dressing: Secondary | ICD-10-CM | POA: Diagnosis not present

## 2014-08-08 DIAGNOSIS — I10 Essential (primary) hypertension: Secondary | ICD-10-CM | POA: Diagnosis not present

## 2014-08-08 DIAGNOSIS — I739 Peripheral vascular disease, unspecified: Secondary | ICD-10-CM | POA: Diagnosis not present

## 2014-08-09 DIAGNOSIS — Z4800 Encounter for change or removal of nonsurgical wound dressing: Secondary | ICD-10-CM | POA: Diagnosis not present

## 2014-08-09 DIAGNOSIS — Z48812 Encounter for surgical aftercare following surgery on the circulatory system: Secondary | ICD-10-CM | POA: Diagnosis not present

## 2014-08-09 DIAGNOSIS — I10 Essential (primary) hypertension: Secondary | ICD-10-CM | POA: Diagnosis not present

## 2014-08-09 DIAGNOSIS — Z4801 Encounter for change or removal of surgical wound dressing: Secondary | ICD-10-CM | POA: Diagnosis not present

## 2014-08-09 DIAGNOSIS — L89159 Pressure ulcer of sacral region, unspecified stage: Secondary | ICD-10-CM | POA: Diagnosis not present

## 2014-08-09 DIAGNOSIS — I739 Peripheral vascular disease, unspecified: Secondary | ICD-10-CM | POA: Diagnosis not present

## 2014-08-10 ENCOUNTER — Encounter: Payer: Self-pay | Admitting: Vascular Surgery

## 2014-08-11 ENCOUNTER — Ambulatory Visit (INDEPENDENT_AMBULATORY_CARE_PROVIDER_SITE_OTHER): Payer: Self-pay | Admitting: Vascular Surgery

## 2014-08-11 ENCOUNTER — Encounter: Payer: Self-pay | Admitting: Vascular Surgery

## 2014-08-11 VITALS — BP 160/60 | HR 65 | Resp 14 | Ht 61.0 in | Wt 98.0 lb

## 2014-08-11 DIAGNOSIS — I70229 Atherosclerosis of native arteries of extremities with rest pain, unspecified extremity: Secondary | ICD-10-CM

## 2014-08-11 DIAGNOSIS — Z4801 Encounter for change or removal of surgical wound dressing: Secondary | ICD-10-CM | POA: Diagnosis not present

## 2014-08-11 DIAGNOSIS — I10 Essential (primary) hypertension: Secondary | ICD-10-CM | POA: Diagnosis not present

## 2014-08-11 DIAGNOSIS — L89159 Pressure ulcer of sacral region, unspecified stage: Secondary | ICD-10-CM | POA: Diagnosis not present

## 2014-08-11 DIAGNOSIS — Z48812 Encounter for surgical aftercare following surgery on the circulatory system: Secondary | ICD-10-CM | POA: Diagnosis not present

## 2014-08-11 DIAGNOSIS — Z4800 Encounter for change or removal of nonsurgical wound dressing: Secondary | ICD-10-CM | POA: Diagnosis not present

## 2014-08-11 DIAGNOSIS — I739 Peripheral vascular disease, unspecified: Secondary | ICD-10-CM | POA: Diagnosis not present

## 2014-08-11 DIAGNOSIS — I998 Other disorder of circulatory system: Secondary | ICD-10-CM

## 2014-08-11 NOTE — Progress Notes (Signed)
   POST OPERATIVE OFFICE NOTE  CC: F/u for surgery   HPI: This is a 80 y.o. female who is s/p right iliofemoral endarterectomy with bovine patch angioplasty and left to right femorofemoral bypass on 07/05/14 by Dr. Bridgett Larsson. Pt denies fever or chills. She denies drainage from right groin. Home health nursing is doing wound care on the R groin: Santyl with wet-to-dry dressings  Physical Exam:  Filed Vitals:   08/11/14 1515  BP: 160/60  Pulse: 65  Resp: 14  Height: 5\' 1"  (1.549 m)  Weight: 98 lb (44.453 kg)   R groin: Superior 5 mm opening in fat, prior area superficial separation larger with near resolution of underlying fibrinous exudate, good granulation tissue is evident underneath , no graft exposed  Extremities: + palpable DP pulses bilaterally   A/P: This is a 80 y.o. female here for f/u for right iliofemoral endarterectomy with bovine patch angioplasty and left to right femorofemoral bypass grafting 07/05/14 by Dr. Bridgett Larsson.  - Santyl ointment to R groin daily with wet-to-dry dressing on top daily  - Follow up in two weeks   Adele Barthel, MD [facility] [place]: [phone] Pager: [phone]  08/11/2014, 3:42 PM

## 2014-08-14 DIAGNOSIS — Z48812 Encounter for surgical aftercare following surgery on the circulatory system: Secondary | ICD-10-CM | POA: Diagnosis not present

## 2014-08-14 DIAGNOSIS — L89159 Pressure ulcer of sacral region, unspecified stage: Secondary | ICD-10-CM | POA: Diagnosis not present

## 2014-08-14 DIAGNOSIS — Z4801 Encounter for change or removal of surgical wound dressing: Secondary | ICD-10-CM | POA: Diagnosis not present

## 2014-08-14 DIAGNOSIS — I10 Essential (primary) hypertension: Secondary | ICD-10-CM | POA: Diagnosis not present

## 2014-08-14 DIAGNOSIS — I739 Peripheral vascular disease, unspecified: Secondary | ICD-10-CM | POA: Diagnosis not present

## 2014-08-14 DIAGNOSIS — Z4800 Encounter for change or removal of nonsurgical wound dressing: Secondary | ICD-10-CM | POA: Diagnosis not present

## 2014-08-16 DIAGNOSIS — Z4801 Encounter for change or removal of surgical wound dressing: Secondary | ICD-10-CM | POA: Diagnosis not present

## 2014-08-16 DIAGNOSIS — Z48812 Encounter for surgical aftercare following surgery on the circulatory system: Secondary | ICD-10-CM | POA: Diagnosis not present

## 2014-08-16 DIAGNOSIS — L89159 Pressure ulcer of sacral region, unspecified stage: Secondary | ICD-10-CM | POA: Diagnosis not present

## 2014-08-16 DIAGNOSIS — I739 Peripheral vascular disease, unspecified: Secondary | ICD-10-CM | POA: Diagnosis not present

## 2014-08-16 DIAGNOSIS — I10 Essential (primary) hypertension: Secondary | ICD-10-CM | POA: Diagnosis not present

## 2014-08-16 DIAGNOSIS — Z4800 Encounter for change or removal of nonsurgical wound dressing: Secondary | ICD-10-CM | POA: Diagnosis not present

## 2014-08-21 DIAGNOSIS — I10 Essential (primary) hypertension: Secondary | ICD-10-CM | POA: Diagnosis not present

## 2014-08-21 DIAGNOSIS — L89159 Pressure ulcer of sacral region, unspecified stage: Secondary | ICD-10-CM | POA: Diagnosis not present

## 2014-08-21 DIAGNOSIS — Z48812 Encounter for surgical aftercare following surgery on the circulatory system: Secondary | ICD-10-CM | POA: Diagnosis not present

## 2014-08-21 DIAGNOSIS — I739 Peripheral vascular disease, unspecified: Secondary | ICD-10-CM | POA: Diagnosis not present

## 2014-08-21 DIAGNOSIS — Z4801 Encounter for change or removal of surgical wound dressing: Secondary | ICD-10-CM | POA: Diagnosis not present

## 2014-08-21 DIAGNOSIS — Z4800 Encounter for change or removal of nonsurgical wound dressing: Secondary | ICD-10-CM | POA: Diagnosis not present

## 2014-08-23 DIAGNOSIS — I739 Peripheral vascular disease, unspecified: Secondary | ICD-10-CM | POA: Diagnosis not present

## 2014-08-23 DIAGNOSIS — Z4801 Encounter for change or removal of surgical wound dressing: Secondary | ICD-10-CM | POA: Diagnosis not present

## 2014-08-23 DIAGNOSIS — L89159 Pressure ulcer of sacral region, unspecified stage: Secondary | ICD-10-CM | POA: Diagnosis not present

## 2014-08-23 DIAGNOSIS — Z48812 Encounter for surgical aftercare following surgery on the circulatory system: Secondary | ICD-10-CM | POA: Diagnosis not present

## 2014-08-23 DIAGNOSIS — Z4800 Encounter for change or removal of nonsurgical wound dressing: Secondary | ICD-10-CM | POA: Diagnosis not present

## 2014-08-23 DIAGNOSIS — I10 Essential (primary) hypertension: Secondary | ICD-10-CM | POA: Diagnosis not present

## 2014-08-24 ENCOUNTER — Encounter: Payer: Self-pay | Admitting: Vascular Surgery

## 2014-08-24 ENCOUNTER — Ambulatory Visit (INDEPENDENT_AMBULATORY_CARE_PROVIDER_SITE_OTHER): Payer: Self-pay | Admitting: Vascular Surgery

## 2014-08-24 VITALS — BP 184/75 | HR 66 | Temp 97.0°F | Resp 14 | Ht 61.0 in | Wt 104.4 lb

## 2014-08-24 DIAGNOSIS — Q600 Renal agenesis, unilateral: Secondary | ICD-10-CM | POA: Diagnosis not present

## 2014-08-24 DIAGNOSIS — I70269 Atherosclerosis of native arteries of extremities with gangrene, unspecified extremity: Secondary | ICD-10-CM

## 2014-08-24 DIAGNOSIS — I739 Peripheral vascular disease, unspecified: Secondary | ICD-10-CM | POA: Diagnosis not present

## 2014-08-24 MED ORDER — CIPROFLOXACIN HCL 500 MG PO TABS: 500.0000 mg | ORAL_TABLET | Freq: Two times a day (BID) | ORAL | Status: DC

## 2014-08-24 NOTE — Progress Notes (Signed)
    POST OPERATIVE OFFICE NOTE   CC: F/u for surgery   HPI: This is a 80 y.o. female who is s/p right iliofemoral endarterectomy with bovine patch angioplasty and left to right femorofemoral bypass on 07/05/14 by Dr. Bridgett Larsson. Mary [name] denies fever or chills.  Home health nursing is doing wound care on the R groin: Santyl with wet-to-dry dressings.  Reportedly green drainage from wounds.  Physical Exam:   Filed Vitals:   08/24/14 1616  BP: 184/75  Pulse: 66  Temp: 97 F (36.1 C)  Resp: 14  Height: 5\' 1"  (1.549 m)  Weight: 104 lb 6.4 oz (47.356 kg)    R groin: Superior: 5 mm opening in fat, Inferior: 1.5 cm opening in subcut., resolution of underlying fibrinous exudate, good granulation tissue is evident underneath , no graft exposed; no drainage for either opening   Extremities: + palpable DP pulses bilaterally   A/P: Mary [name] is a 80 y.o. female  S/p right iliofemoral endarterectomy with bovine patch angioplasty and left to right femorofemoral bypass grafting 07/05/14 by Dr. Bridgett Larsson.   - Unclear if Mary [name] truly is having drainage based on exam but history is suggestive of some sort of superificial wound infection.  Mary [name] was given a course of Keflex immediately post-op so will try: Cipro 500 mg 1 po BID x 10 days. - Santyl ointment to R groin daily with wet-to-dry dressing on top daily  - Follow up in two weeks   Adele Barthel, MD [facility] [place]: [phone] Pager: [phone]  08/24/2014, 4:43 PM

## 2014-08-25 ENCOUNTER — Ambulatory Visit: Payer: Medicare Other | Admitting: Vascular Surgery

## 2014-08-28 DIAGNOSIS — Z4801 Encounter for change or removal of surgical wound dressing: Secondary | ICD-10-CM | POA: Diagnosis not present

## 2014-08-28 DIAGNOSIS — Z48812 Encounter for surgical aftercare following surgery on the circulatory system: Secondary | ICD-10-CM | POA: Diagnosis not present

## 2014-08-28 DIAGNOSIS — L89159 Pressure ulcer of sacral region, unspecified stage: Secondary | ICD-10-CM | POA: Diagnosis not present

## 2014-08-28 DIAGNOSIS — I10 Essential (primary) hypertension: Secondary | ICD-10-CM | POA: Diagnosis not present

## 2014-08-28 DIAGNOSIS — Z4800 Encounter for change or removal of nonsurgical wound dressing: Secondary | ICD-10-CM | POA: Diagnosis not present

## 2014-08-28 DIAGNOSIS — I739 Peripheral vascular disease, unspecified: Secondary | ICD-10-CM | POA: Diagnosis not present

## 2014-08-30 DIAGNOSIS — L89159 Pressure ulcer of sacral region, unspecified stage: Secondary | ICD-10-CM | POA: Diagnosis not present

## 2014-08-30 DIAGNOSIS — I10 Essential (primary) hypertension: Secondary | ICD-10-CM | POA: Diagnosis not present

## 2014-08-30 DIAGNOSIS — I739 Peripheral vascular disease, unspecified: Secondary | ICD-10-CM | POA: Diagnosis not present

## 2014-08-30 DIAGNOSIS — Z4800 Encounter for change or removal of nonsurgical wound dressing: Secondary | ICD-10-CM | POA: Diagnosis not present

## 2014-08-30 DIAGNOSIS — Z4801 Encounter for change or removal of surgical wound dressing: Secondary | ICD-10-CM | POA: Diagnosis not present

## 2014-08-30 DIAGNOSIS — Z48812 Encounter for surgical aftercare following surgery on the circulatory system: Secondary | ICD-10-CM | POA: Diagnosis not present

## 2014-09-04 DIAGNOSIS — I10 Essential (primary) hypertension: Secondary | ICD-10-CM | POA: Diagnosis not present

## 2014-09-04 DIAGNOSIS — Z4800 Encounter for change or removal of nonsurgical wound dressing: Secondary | ICD-10-CM | POA: Diagnosis not present

## 2014-09-04 DIAGNOSIS — Z48812 Encounter for surgical aftercare following surgery on the circulatory system: Secondary | ICD-10-CM | POA: Diagnosis not present

## 2014-09-04 DIAGNOSIS — I739 Peripheral vascular disease, unspecified: Secondary | ICD-10-CM | POA: Diagnosis not present

## 2014-09-04 DIAGNOSIS — L89159 Pressure ulcer of sacral region, unspecified stage: Secondary | ICD-10-CM | POA: Diagnosis not present

## 2014-09-04 DIAGNOSIS — Z4801 Encounter for change or removal of surgical wound dressing: Secondary | ICD-10-CM | POA: Diagnosis not present

## 2014-09-07 ENCOUNTER — Encounter: Payer: Self-pay | Admitting: Vascular Surgery

## 2014-09-07 DIAGNOSIS — H35361 Drusen (degenerative) of macula, right eye: Secondary | ICD-10-CM | POA: Diagnosis not present

## 2014-09-07 DIAGNOSIS — H35362 Drusen (degenerative) of macula, left eye: Secondary | ICD-10-CM | POA: Diagnosis not present

## 2014-09-07 DIAGNOSIS — H35372 Puckering of macula, left eye: Secondary | ICD-10-CM | POA: Diagnosis not present

## 2014-09-07 DIAGNOSIS — H43821 Vitreomacular adhesion, right eye: Secondary | ICD-10-CM | POA: Diagnosis not present

## 2014-09-08 ENCOUNTER — Ambulatory Visit (INDEPENDENT_AMBULATORY_CARE_PROVIDER_SITE_OTHER): Payer: Medicare Other | Admitting: Vascular Surgery

## 2014-09-08 ENCOUNTER — Encounter: Payer: Self-pay | Admitting: Vascular Surgery

## 2014-09-08 VITALS — BP 101/84 | HR 70 | Ht 61.0 in | Wt 102.4 lb

## 2014-09-08 DIAGNOSIS — I70229 Atherosclerosis of native arteries of extremities with rest pain, unspecified extremity: Secondary | ICD-10-CM

## 2014-09-08 DIAGNOSIS — Z48812 Encounter for surgical aftercare following surgery on the circulatory system: Secondary | ICD-10-CM

## 2014-09-08 DIAGNOSIS — I998 Other disorder of circulatory system: Secondary | ICD-10-CM | POA: Insufficient documentation

## 2014-09-08 NOTE — Addendum Note (Signed)
Addended by: Mena Goes on: 09/08/2014 10:48 AM   Modules accepted: Orders

## 2014-09-08 NOTE — Progress Notes (Signed)
   POST OPERATIVE OFFICE NOTE   CC: F/u for surgery  HPI: This is a 80 y.o. female who is s/p right iliofemoral endarterectomy with bovine patch angioplasty and left to right femorofemoral bypass on 07/05/14 by Dr. Bridgett Larsson. Pt denies fever or chills. Pt nearly healed in R groin.  Physical Exam:  Filed Vitals:   09/08/14 0913  BP: 101/84  Pulse: 70  Height: 5\' 1"  (1.549 m)  Weight: 102 lb 6.4 oz (46.448 kg)  SpO2: 100%   R groin: healed except <1 cm area of skin re-epithelization inferiorly, palpable fem-fem pulse  L groin: healed  Extremities: + palpable DP pulses bilaterally   A/P: Mary [name] is a 80 y.o. female S/p right iliofemoral endarterectomy with bovine patch angioplasty and left to right femorofemoral bypass grafting 07/05/14 by Dr. Bridgett Larsson.   - Wash right groin BID with soap and water and apply a bandage.  I expect her to heal in 1-2 weeks completely. - Pt on aspirin but no statin as not medically indicated. - Fem-fem duplex and ABI in 3 months - Follow up at that time.  Adele Barthel, MD [facility] [place] Office: [phone] Pager: [phone]  09/08/2014, 10:34 AM

## 2014-09-11 DIAGNOSIS — I739 Peripheral vascular disease, unspecified: Secondary | ICD-10-CM | POA: Diagnosis not present

## 2014-09-11 DIAGNOSIS — Z4801 Encounter for change or removal of surgical wound dressing: Secondary | ICD-10-CM | POA: Diagnosis not present

## 2014-09-11 DIAGNOSIS — Z4800 Encounter for change or removal of nonsurgical wound dressing: Secondary | ICD-10-CM | POA: Diagnosis not present

## 2014-09-11 DIAGNOSIS — Z48812 Encounter for surgical aftercare following surgery on the circulatory system: Secondary | ICD-10-CM | POA: Diagnosis not present

## 2014-09-11 DIAGNOSIS — L89159 Pressure ulcer of sacral region, unspecified stage: Secondary | ICD-10-CM | POA: Diagnosis not present

## 2014-09-11 DIAGNOSIS — I10 Essential (primary) hypertension: Secondary | ICD-10-CM | POA: Diagnosis not present

## 2014-09-18 DIAGNOSIS — L89159 Pressure ulcer of sacral region, unspecified stage: Secondary | ICD-10-CM | POA: Diagnosis not present

## 2014-09-18 DIAGNOSIS — I10 Essential (primary) hypertension: Secondary | ICD-10-CM | POA: Diagnosis not present

## 2014-09-18 DIAGNOSIS — Z4800 Encounter for change or removal of nonsurgical wound dressing: Secondary | ICD-10-CM | POA: Diagnosis not present

## 2014-09-18 DIAGNOSIS — Z48812 Encounter for surgical aftercare following surgery on the circulatory system: Secondary | ICD-10-CM | POA: Diagnosis not present

## 2014-09-18 DIAGNOSIS — I739 Peripheral vascular disease, unspecified: Secondary | ICD-10-CM | POA: Diagnosis not present

## 2014-09-18 DIAGNOSIS — Z4801 Encounter for change or removal of surgical wound dressing: Secondary | ICD-10-CM | POA: Diagnosis not present

## 2014-10-19 ENCOUNTER — Encounter (HOSPITAL_COMMUNITY): Payer: Self-pay | Admitting: Vascular Surgery

## 2014-11-30 DIAGNOSIS — E782 Mixed hyperlipidemia: Secondary | ICD-10-CM | POA: Diagnosis not present

## 2014-11-30 DIAGNOSIS — D649 Anemia, unspecified: Secondary | ICD-10-CM | POA: Diagnosis not present

## 2014-11-30 DIAGNOSIS — I10 Essential (primary) hypertension: Secondary | ICD-10-CM | POA: Diagnosis not present

## 2014-11-30 DIAGNOSIS — N183 Chronic kidney disease, stage 3 (moderate): Secondary | ICD-10-CM | POA: Diagnosis not present

## 2014-12-06 DIAGNOSIS — N183 Chronic kidney disease, stage 3 (moderate): Secondary | ICD-10-CM | POA: Diagnosis not present

## 2014-12-06 DIAGNOSIS — I739 Peripheral vascular disease, unspecified: Secondary | ICD-10-CM | POA: Diagnosis not present

## 2014-12-06 DIAGNOSIS — E782 Mixed hyperlipidemia: Secondary | ICD-10-CM | POA: Diagnosis not present

## 2014-12-06 DIAGNOSIS — I10 Essential (primary) hypertension: Secondary | ICD-10-CM | POA: Diagnosis not present

## 2014-12-06 DIAGNOSIS — Q600 Renal agenesis, unilateral: Secondary | ICD-10-CM | POA: Diagnosis not present

## 2014-12-06 DIAGNOSIS — Z9189 Other specified personal risk factors, not elsewhere classified: Secondary | ICD-10-CM | POA: Diagnosis not present

## 2014-12-06 DIAGNOSIS — D649 Anemia, unspecified: Secondary | ICD-10-CM | POA: Diagnosis not present

## 2014-12-07 ENCOUNTER — Encounter: Payer: Self-pay | Admitting: Vascular Surgery

## 2014-12-08 ENCOUNTER — Ambulatory Visit (HOSPITAL_COMMUNITY)
Admission: RE | Admit: 2014-12-08 | Discharge: 2014-12-08 | Disposition: A | Discharge status: Home / Self Care | Payer: Medicare Other | Source: Ambulatory Visit | Attending: Vascular Surgery | Admitting: Vascular Surgery

## 2014-12-08 ENCOUNTER — Encounter: Payer: Self-pay | Admitting: Vascular Surgery

## 2014-12-08 ENCOUNTER — Ambulatory Visit (INDEPENDENT_AMBULATORY_CARE_PROVIDER_SITE_OTHER)
Admission: RE | Admit: 2014-12-08 | Discharge: 2014-12-08 | Disposition: A | Discharge status: Home / Self Care | Payer: Medicare Other | Source: Ambulatory Visit | Attending: Vascular Surgery | Admitting: Vascular Surgery

## 2014-12-08 ENCOUNTER — Ambulatory Visit (INDEPENDENT_AMBULATORY_CARE_PROVIDER_SITE_OTHER): Payer: Medicare Other | Admitting: Vascular Surgery

## 2014-12-08 VITALS — BP 143/67 | HR 56 | Ht 61.0 in | Wt 100.6 lb

## 2014-12-08 DIAGNOSIS — I998 Other disorder of circulatory system: Secondary | ICD-10-CM

## 2014-12-08 DIAGNOSIS — I70229 Atherosclerosis of native arteries of extremities with rest pain, unspecified extremity: Secondary | ICD-10-CM

## 2014-12-08 DIAGNOSIS — I6523 Occlusion and stenosis of bilateral carotid arteries: Secondary | ICD-10-CM | POA: Diagnosis not present

## 2014-12-08 DIAGNOSIS — I70202 Unspecified atherosclerosis of native arteries of extremities, left leg: Secondary | ICD-10-CM | POA: Diagnosis not present

## 2014-12-08 DIAGNOSIS — M25551 Pain in right hip: Secondary | ICD-10-CM | POA: Insufficient documentation

## 2014-12-08 DIAGNOSIS — Z48812 Encounter for surgical aftercare following surgery on the circulatory system: Secondary | ICD-10-CM | POA: Diagnosis not present

## 2014-12-08 DIAGNOSIS — R200 Anesthesia of skin: Secondary | ICD-10-CM | POA: Diagnosis not present

## 2014-12-08 NOTE — Addendum Note (Signed)
Addended by: Mena Goes on: 12/08/2014 03:33 PM   Modules accepted: Orders

## 2014-12-08 NOTE — Progress Notes (Signed)
    Established Previous Bypass  History of Present Illness  Mary [name] is a 81 y.o. (12/23/1922) female who presents with chief complaint: routine follow-up.  Previous operation(s) complete on 07/05/14 include: Right iliofem EA w/ BPA, L to R fem-fem BPG.  The patient's symptoms have resolved.  The patient's symptoms are: mild arthritic pain with ambulation. The patient's treatment regimen currently included: maximal medical management.  The patient's PMH, PSH, SH, FamHx, Med, and Allergies are unchanged from 09/08/14.  On ROS today: no rest pain, no intermittent claudication , all wounds healed   Physical Examination  Filed Vitals:   12/08/14 1018  BP: 143/67  Pulse: 56  Height: 5\' 1"  (1.549 m)  Weight: 100 lb 9.6 oz (45.632 kg)  SpO2: 99%   Body mass index is 19.02 kg/(m^2).  General: A&O x 3, WD, thin  Pulmonary: Sym exp, good air movt, CTAB, no rales, rhonchi, & wheezing  Cardiac: RRR, Nl S1, S2, no Murmurs, rubs or gallops  Vascular: Vessel Right Left  Radial Palpable Palpable  Brachial Palpable Palpable  Carotid Palpable, without bruit Palpable, without bruit  Aorta Not palpable N/A  Femoral Palpable Palpable  Popliteal Not palpable Not palpable  PT Faintly Palpable Faintly Palpable  DP Faintly Palpable Faintly Palpable   Gastrointestinal: soft, NTND, -G/R, - HSM, - masses, - CVAT B  Musculoskeletal: M/S 5/5 throughout , Extremities without ischemic changes , palpable fem-fem pulse, both groins well healed  Neurologic: Pain and light touch intact in extremities , Motor exam as listed above  Non-Invasive Vascular Imaging ABI (Date: 12/08/2014) R: 0.83 (0.39), DP: bi, PT: bi, TBI: 0.21 L: 0.75 (0.35), DP: bi, PT: bi, TBI: 0.35  Aortoiliac Duplex (Date: 12/08/2014)  Ao: 94 c/s  R iliac: occluded  L iliac: 315 c/s in external (<3)  Widely patent fem-fem  Medical Decision Making  Mary [name] is a 81 y.o. female who presents with: s/p R  iliofem EA w/ BPA, L to R fem-fem BPG  .  Based on the patient's vascular studies and examination, I have offered the patient: BLE ABI, aortoiliac duplex q 6 months  I discussed in depth with the patient the nature of atherosclerosis, and emphasized the importance of maximal medical management including strict control of blood pressure, blood glucose, and lipid levels, obtaining regular exercise, and cessation of smoking.  The patient is aware that without maximal medical management the underlying atherosclerotic disease process will progress, limiting the benefit of any interventions.  I discussed in depth with the patient the need for long term surveillance to improve the primary assisted patency of his bypass.  The patient agrees to cooperate with such.  The patient is scheduled for the previous listed surveillance studies in 6 months. The patient is currently not on a statin as she is already at target. The patient is currently on an anti-platelet: ASA.  Thank you for allowing [place] to participate in this patient's care.  Adele Barthel, MD [facility] [place] Office: [phone] Pager: [phone]  12/08/2014, 10:31 AM

## 2014-12-20 DIAGNOSIS — M2011 Hallux valgus (acquired), right foot: Secondary | ICD-10-CM | POA: Diagnosis not present

## 2014-12-20 DIAGNOSIS — I739 Peripheral vascular disease, unspecified: Secondary | ICD-10-CM | POA: Diagnosis not present

## 2014-12-20 DIAGNOSIS — M2012 Hallux valgus (acquired), left foot: Secondary | ICD-10-CM | POA: Diagnosis not present

## 2014-12-20 DIAGNOSIS — B351 Tinea unguium: Secondary | ICD-10-CM | POA: Diagnosis not present

## 2015-03-14 DIAGNOSIS — I739 Peripheral vascular disease, unspecified: Secondary | ICD-10-CM | POA: Diagnosis not present

## 2015-03-14 DIAGNOSIS — B351 Tinea unguium: Secondary | ICD-10-CM | POA: Diagnosis not present

## 2015-03-14 DIAGNOSIS — L110 Acquired keratosis follicularis: Secondary | ICD-10-CM | POA: Diagnosis not present

## 2015-03-21 DIAGNOSIS — L03032 Cellulitis of left toe: Secondary | ICD-10-CM | POA: Diagnosis not present

## 2015-04-04 DIAGNOSIS — I10 Essential (primary) hypertension: Secondary | ICD-10-CM | POA: Diagnosis not present

## 2015-04-04 DIAGNOSIS — E782 Mixed hyperlipidemia: Secondary | ICD-10-CM | POA: Diagnosis not present

## 2015-04-11 DIAGNOSIS — E782 Mixed hyperlipidemia: Secondary | ICD-10-CM | POA: Diagnosis not present

## 2015-04-11 DIAGNOSIS — I10 Essential (primary) hypertension: Secondary | ICD-10-CM | POA: Diagnosis not present

## 2015-04-11 DIAGNOSIS — G6289 Other specified polyneuropathies: Secondary | ICD-10-CM | POA: Diagnosis not present

## 2015-04-11 DIAGNOSIS — N183 Chronic kidney disease, stage 3 (moderate): Secondary | ICD-10-CM | POA: Diagnosis not present

## 2015-04-11 DIAGNOSIS — I739 Peripheral vascular disease, unspecified: Secondary | ICD-10-CM | POA: Diagnosis not present

## 2015-05-24 DIAGNOSIS — I739 Peripheral vascular disease, unspecified: Secondary | ICD-10-CM | POA: Diagnosis not present

## 2015-05-24 DIAGNOSIS — B351 Tinea unguium: Secondary | ICD-10-CM | POA: Diagnosis not present

## 2015-05-24 DIAGNOSIS — L110 Acquired keratosis follicularis: Secondary | ICD-10-CM | POA: Diagnosis not present

## 2015-06-08 ENCOUNTER — Encounter (HOSPITAL_COMMUNITY): Payer: Medicare Other

## 2015-06-08 ENCOUNTER — Ambulatory Visit: Payer: Medicare Other | Admitting: Family

## 2015-06-29 NOTE — Patient Outreach (Signed)
[place] [facility]) Care Management  06/29/2015  CHENELLE BENNING 05-04-1923 [id number]   Referral from MD with notes, assigned Jacqlyn Larsen, RN.  Thanks, Ronnell Freshwater. [place], [place] Assistant Phone: [phone] Fax: [phone]

## 2015-07-02 ENCOUNTER — Other Ambulatory Visit: Payer: Self-pay | Admitting: *Deleted

## 2015-07-02 ENCOUNTER — Encounter: Payer: Self-pay | Admitting: *Deleted

## 2015-07-02 NOTE — Patient Outreach (Signed)
07/02/15-Telephone call to patient for initial screening, spoke with pt, HIPAA verified, RN CM explained [facility] program, pt reports she still drives and attends all appointments, has son and daughter that assist her if needed,  Pt has all medications and states she can afford them and takes as prescribed.  Pt reports blood pressure is managed well and says " Once in awhile it goes up or down, but mostly good"  Pt states she does not have any questions about health conditions and refuses [facility] program, states she does not want [facility] information sent in mail, Letter faxed to Dr. Quillian Quince informing pt refuses program.  Jacqlyn Larsen [facility] [place] Coordinator [phone]

## 2015-07-10 NOTE — Patient Outreach (Signed)
[place] [facility]) Care Management  07/10/2015  Mary [name] [birth year] [id number]   Notification from Jacqlyn Larsen, RN to close case due to patient refused [place] Management services.  Thanks, Ronnell Freshwater. [place], [place] Assistant Phone: [phone] Fax: [phone]

## 2015-07-12 ENCOUNTER — Other Ambulatory Visit: Payer: Self-pay | Admitting: Vascular Surgery

## 2015-07-12 ENCOUNTER — Encounter: Payer: Self-pay | Admitting: Family

## 2015-07-12 DIAGNOSIS — I6523 Occlusion and stenosis of bilateral carotid arteries: Secondary | ICD-10-CM

## 2015-07-12 DIAGNOSIS — I70229 Atherosclerosis of native arteries of extremities with rest pain, unspecified extremity: Secondary | ICD-10-CM

## 2015-07-12 DIAGNOSIS — I998 Other disorder of circulatory system: Secondary | ICD-10-CM

## 2015-07-13 ENCOUNTER — Ambulatory Visit (HOSPITAL_COMMUNITY)
Admission: RE | Admit: 2015-07-13 | Discharge: 2015-07-13 | Disposition: A | Discharge status: Home / Self Care | Payer: Medicare Other | Source: Ambulatory Visit | Attending: Vascular Surgery | Admitting: Vascular Surgery

## 2015-07-13 ENCOUNTER — Ambulatory Visit (INDEPENDENT_AMBULATORY_CARE_PROVIDER_SITE_OTHER): Payer: Medicare Other | Admitting: Family

## 2015-07-13 ENCOUNTER — Ambulatory Visit (INDEPENDENT_AMBULATORY_CARE_PROVIDER_SITE_OTHER)
Admission: RE | Admit: 2015-07-13 | Discharge: 2015-07-13 | Disposition: A | Discharge status: Home / Self Care | Payer: Medicare Other | Source: Ambulatory Visit | Attending: Vascular Surgery | Admitting: Vascular Surgery

## 2015-07-13 ENCOUNTER — Encounter: Payer: Self-pay | Admitting: Family

## 2015-07-13 VITALS — BP 198/83 | HR 56 | Temp 98.3°F | Resp 16 | Ht 61.0 in | Wt 106.0 lb

## 2015-07-13 DIAGNOSIS — Z48812 Encounter for surgical aftercare following surgery on the circulatory system: Secondary | ICD-10-CM

## 2015-07-13 DIAGNOSIS — I998 Other disorder of circulatory system: Secondary | ICD-10-CM | POA: Insufficient documentation

## 2015-07-13 DIAGNOSIS — Z87891 Personal history of nicotine dependence: Secondary | ICD-10-CM | POA: Diagnosis not present

## 2015-07-13 DIAGNOSIS — I6523 Occlusion and stenosis of bilateral carotid arteries: Secondary | ICD-10-CM

## 2015-07-13 DIAGNOSIS — Z9889 Other specified postprocedural states: Secondary | ICD-10-CM

## 2015-07-13 DIAGNOSIS — I779 Disorder of arteries and arterioles, unspecified: Secondary | ICD-10-CM

## 2015-07-13 DIAGNOSIS — Z95828 Presence of other vascular implants and grafts: Secondary | ICD-10-CM | POA: Insufficient documentation

## 2015-07-13 DIAGNOSIS — R0989 Other specified symptoms and signs involving the circulatory and respiratory systems: Secondary | ICD-10-CM

## 2015-07-13 DIAGNOSIS — I70229 Atherosclerosis of native arteries of extremities with rest pain, unspecified extremity: Secondary | ICD-10-CM

## 2015-07-13 NOTE — Progress Notes (Signed)
[facility] [place] HISTORY AND PHYSICAL   MRN : [id number]  History of Present Illness:   Mary [name] is a 81 y.o. female patient of Dr. Bridgett Larsson who presents with chief complaint: routine follow-up. Previous operation(s) complete on 07/05/14 include: Right iliofem EA w/ BPA, L to R fem-fem BPG. The patient's symptoms have resolved. The patient's symptoms are: mild arthritic pain with ambulation. She has some pain in left foot that remains, but the pain in her right calf has resolved. She has no claudication symptoms in her left LE. She denies non healing wounds. The patient's treatment regimen currently included: maximal medical management. Pt denies any history of stroke or TIA. Pt denies any new medical problems or new surgeries.  Pt Diabetic: No Pt smoker: former smoker, quit in the 1980's   Pt meds include: Statin :No, takes Red Yeast Rice Betablocker: Yes ASA: Yes Other anticoagulants/antiplatelets: no  Current Outpatient Prescriptions  Medication Sig Dispense Refill  . aspirin 81 MG chewable tablet Chew 1 tablet (81 mg total) by mouth daily.    . Calcium Carbonate-Vitamin D (CALCIUM + D PO) Take 1 tablet by mouth 2 (two) times daily.    Marland Kitchen losartan-hydrochlorothiazide (HYZAAR) 100-25 MG per tablet Take 1 tablet by mouth daily.    . metoprolol succinate (TOPROL-XL) 50 MG 24 hr tablet Take 50 mg by mouth daily. Take with or immediately following a meal.    . Red Yeast Rice 600 MG TABS Take 1 tablet by mouth daily.     No current facility-administered medications for this visit.    Past Medical History  Diagnosis Date  . Hypertension   . Osteoporosis   . Pneumonia   . Anemia   . Arthritis   . Cancer     colon cancer  . Peripheral vascular disease   . Rate-related bundle branch block 07/04/2014    Social History Social History  Substance Use Topics  . Smoking status: Former Smoker    Quit date: 03/19/1979  . Smokeless tobacco: Never Used   . Alcohol Use: No    Family History Family History  Problem Relation Age of Onset  . Stroke Mother   . Heart disease Mother   . Diabetes Mother   . Hypertension Mother   . Hypertension Sister   . Cancer Brother   . Heart attack Brother   . Cancer Daughter   . Hyperlipidemia Daughter   . Cancer Son   . Cancer Daughter   . Cancer Son     Surgical History Past Surgical History  Procedure Laterality Date  . Cholecystectomy    . Colon surgery      colon cancer  . Tubal ligation    . Colonoscopy  03/19/2012    Procedure: COLONOSCOPY;  Surgeon: Jerene Bears, MD;  Location: [place];  Service: Gastroenterology;  Laterality: N/A;  . Esophagogastroduodenoscopy  03/19/2012    Procedure: ESOPHAGOGASTRODUODENOSCOPY (EGD);  Surgeon: Jerene Bears, MD;  Location: [place];  Service: Gastroenterology;  Laterality: N/A;  . Esophagogastroduodenoscopy  03/20/2012    Procedure: ESOPHAGOGASTRODUODENOSCOPY (EGD);  Surgeon: Jerene Bears, MD;  Location: [place];  Service: Gastroenterology;  Laterality: N/A;  . Colonoscopy  03/20/2012    Procedure: COLONOSCOPY;  Surgeon: Jerene Bears, MD;  Location: [place];  Service: Gastroenterology;  Laterality: N/A;  . Spine surgery  06-19-14  . Endarterectomy femoral Right 07/05/2014    Procedure: ENDARTERECTOMY FEMORAL;  Surgeon: Conrad [place], MD;  Location: [place];  Service: Vascular;  Laterality: Right;  . Femoral-femoral bypass graft Bilateral 07/05/2014    Procedure: BYPASS GRAFT FEMORAL-FEMORAL ARTERY;  Surgeon: Conrad [place], MD;  Location: [place];  Service: Vascular;  Laterality: Bilateral;  . Patch angioplasty Right 07/05/2014    Procedure: PATCH ANGIOPLASTY-Right Femoral Artery;  Surgeon: Conrad Greenleaf, MD;  Location: [place];  Service: Vascular;  Laterality: Right;  . Abdominal aortagram N/A 07/03/2014    Procedure: ABDOMINAL Maxcine Ham;  Surgeon: Conrad Black Jack, MD;  Location: [facility] CATH LAB;  Service: Cardiovascular;  Laterality: N/A;     Allergies  Allergen Reactions  . Percocet [Oxycodone-Acetaminophen] Other (See Comments)    Severe HALLUCINATIONS    Current Outpatient Prescriptions  Medication Sig Dispense Refill  . aspirin 81 MG chewable tablet Chew 1 tablet (81 mg total) by mouth daily.    . Calcium Carbonate-Vitamin D (CALCIUM + D PO) Take 1 tablet by mouth 2 (two) times daily.    Marland Kitchen losartan-hydrochlorothiazide (HYZAAR) 100-25 MG per tablet Take 1 tablet by mouth daily.    . metoprolol succinate (TOPROL-XL) 50 MG 24 hr tablet Take 50 mg by mouth daily. Take with or immediately following a meal.    . Red Yeast Rice 600 MG TABS Take 1 tablet by mouth daily.     No current facility-administered medications for this visit.     REVIEW OF SYSTEMS: See HPI for pertinent positives and negatives.  Physical Examination Filed Vitals:   07/13/15 1110 07/13/15 1120  BP: 208/78 198/83  Pulse: 56 56  Temp: 98.3 F (36.8 C)   TempSrc: Oral   Resp: 16   Height: 5\' 1"  (1.549 m)   Weight: 106 lb (48.081 kg)   SpO2: 98%    Body mass index is 20.04 kg/(m^2).  General: A&O x 3, WD, thin  Pulmonary: Sym exp, good air movt, CTAB, no rales, rhonchi, & wheezing  Cardiac: RRR, Nl S1, S2, no detected murmur  Vascular: Vessel Right Left  Radial Palpable Palpable  Brachial Palpable Palpable  Carotid Palpable, with bruit Palpable, without bruit  Aorta Not palpable N/A  Femoral Palpable Palpable  Popliteal Not palpable Not palpable  PT not Palpable, audible by Doppler not Palpable, audible by Doppler  DP not Palpable, not Dopplerable not Palpable, not Dopplerable   Gastrointestinal: soft, NTND, -G/R, - HSM, - palpable masses, - CVAT B. Fem-fem bypass graft is palpable at pubis.  Musculoskeletal: M/S 5/5 throughout , Extremities without ischemic changes, both groins well healed  Neurologic: Pain and light touch intact in extremities , Motor exam as listed above           Non-Invasive Vascular Imaging (07/13/2015):   CEREBROVASCULAR DUPLEX EVALUATION    INDICATION: Bilateral carotid artery stenosis/occlusion.    PREVIOUS INTERVENTION(S):     DUPLEX EXAM:     RIGHT  LEFT  Peak Systolic Velocities (cm/s) End Diastolic Velocities (cm/s) Plaque LOCATION Peak Systolic Velocities (cm/s) End Diastolic Velocities (cm/s) Plaque  65 10  CCA PROXIMAL 105 17   97 20  CCA MID 117 19   59 13  CCA DISTAL 97 18   63 5  ECA 65 6 HT  56 13 HT ICA PROXIMAL 66 13   78 18  ICA MID 97 21   80 17  ICA DISTAL 84 17     0.9 ICA / CCA Ratio (PSV) 0.6  Antegrade Vertebral Flow Antegrade   Brachial Systolic Pressure (mmHg)   Triphasic (subclavian) Brachial Artery Waveforms Triphasic (subclavian)  Plaque Morphology:  HM = Homogeneous, HT = Heterogeneous, CP = Calcific Plaque, SP = Smooth Plaque, IP = Irregular Plaque  ADDITIONAL FINDINGS:     IMPRESSION: 1. 1-39% right internal carotid artery stenosis. 2. 1-39% left bulb/proximal ECA stenosis.      Compared to the previous exam:  There are no previous exam.     AORTO-ILIAC DUPLEX LIMITED EVALUATION  FEMORAL-FEMORAL BYPASS GRAFT    INDICATION: Follow up bypass graft    PREVIOUS INTERVENTION(S): Left to right femoral-femoral bypass graft on 07/05/14    DUPLEX EXAM:     LOCATION Velocities                (cm/s) Diameter AP                          (cm) Diameter Transverse (cm)  Inflow Artery        237    Anastomosis              162    Graft         36    Anastomosis          31    Outflow Artery      21/170       Right  Left  Today's ABI/TBI    Previous ABI/TBI   12/08/2014  0.83 0.75    If Ankle Brachial Index (ABI) or Toe Brachial Index (TBI) performed, please see complete report   ADDITIONAL FINDINGS: . See attached diagram for additional information.  . No internal vessel narrowing noted in the bypass graft or anastomoses. . Calcific walls versus plaque noted in the distal aorta and left  common and external iliac arteries with increased velocities in the left external iliac artery.    IMPRESSION: 1. Patent femoral-femoral bypass graft with no evidence of stenosis noted. 2. Doppler velocities suggest a greater than 50% stenosis of the left external iliac artery.    Compared to the previous exam:  No significant changes noted when compared to previous exam of 12/08/2014       ASSESSMENT:  DEBBORAH ALONGE is a 81 y.o. female who is s/p right iliofem EA w/ BPA, L to R fem-fem BPG on 07/05/14. The patient's symptoms have resolved. She has no signs of ischemia in her feet or legs. Today's aortoiliac Duplex suggests a patent femoral-femoral bypass graft with no evidence of stenosis and greater than 50% stenosis of the left external iliac artery. No significant changes noted when compared to previous exam of 12/08/2014.  ABI's not performed today. She has no history of stroke or TIA, but she does have a right carotid bruit. Carotid Duplex today suggests minimal bilateral ICA stenosis.   Her blood pressure is elevated now; pt is advised to see her PCP ASAP re this.  Face to face time with patient was 25 minutes. Over 50% of this time was spent on counseling and coordination of care.   PLAN:   Based on today's exam and non-invasive vascular lab results, the patient will follow up in 6 with the following tests: BLE ABI, aortoiliac duplex. Carotid Duplex 1 year. I discussed in depth with the patient the nature of atherosclerosis, and emphasized the importance of maximal medical management including strict control of blood pressure, blood glucose, and lipid levels, obtaining regular exercise, and cessation of smoking.  The patient is aware that without maximal medical management the underlying atherosclerotic disease process will progress, limiting the  benefit of any interventions.  The patient was given information about stroke prevention and what symptoms should prompt the  patient to seek immediate medical care.  The patient was given information about PAD including signs, symptoms, treatment, what symptoms should prompt the patient to seek immediate medical care, and risk reduction measures to take. Thank you for allowing [place] to participate in this patient's care.  Clemon Chambers, RN, MSN, FNP-C [facility] Office: [phone]  Clinic MD: Bridgett Larsson  07/13/2015 12:10 PM

## 2015-07-13 NOTE — Patient Instructions (Signed)

## 2015-07-17 DIAGNOSIS — N183 Chronic kidney disease, stage 3 (moderate): Secondary | ICD-10-CM | POA: Diagnosis not present

## 2015-07-17 DIAGNOSIS — E782 Mixed hyperlipidemia: Secondary | ICD-10-CM | POA: Diagnosis not present

## 2015-07-17 DIAGNOSIS — I10 Essential (primary) hypertension: Secondary | ICD-10-CM | POA: Diagnosis not present

## 2015-07-17 DIAGNOSIS — D649 Anemia, unspecified: Secondary | ICD-10-CM | POA: Diagnosis not present

## 2015-07-23 DIAGNOSIS — G6289 Other specified polyneuropathies: Secondary | ICD-10-CM | POA: Diagnosis not present

## 2015-07-23 DIAGNOSIS — Z23 Encounter for immunization: Secondary | ICD-10-CM | POA: Diagnosis not present

## 2015-07-23 DIAGNOSIS — E782 Mixed hyperlipidemia: Secondary | ICD-10-CM | POA: Diagnosis not present

## 2015-07-23 DIAGNOSIS — I10 Essential (primary) hypertension: Secondary | ICD-10-CM | POA: Diagnosis not present

## 2015-07-23 DIAGNOSIS — N183 Chronic kidney disease, stage 3 (moderate): Secondary | ICD-10-CM | POA: Diagnosis not present

## 2015-07-23 DIAGNOSIS — I739 Peripheral vascular disease, unspecified: Secondary | ICD-10-CM | POA: Diagnosis not present

## 2015-08-01 DIAGNOSIS — I10 Essential (primary) hypertension: Secondary | ICD-10-CM | POA: Diagnosis not present

## 2015-08-01 DIAGNOSIS — E780 Pure hypercholesterolemia: Secondary | ICD-10-CM | POA: Diagnosis not present

## 2015-08-01 DIAGNOSIS — Z87891 Personal history of nicotine dependence: Secondary | ICD-10-CM | POA: Diagnosis not present

## 2015-08-01 DIAGNOSIS — Z79899 Other long term (current) drug therapy: Secondary | ICD-10-CM | POA: Diagnosis not present

## 2015-08-01 DIAGNOSIS — C189 Malignant neoplasm of colon, unspecified: Secondary | ICD-10-CM | POA: Diagnosis not present

## 2015-08-01 DIAGNOSIS — M8589 Other specified disorders of bone density and structure, multiple sites: Secondary | ICD-10-CM | POA: Diagnosis not present

## 2015-08-01 DIAGNOSIS — Z90722 Acquired absence of ovaries, bilateral: Secondary | ICD-10-CM | POA: Diagnosis not present

## 2015-08-02 DIAGNOSIS — B351 Tinea unguium: Secondary | ICD-10-CM | POA: Diagnosis not present

## 2015-08-02 DIAGNOSIS — I739 Peripheral vascular disease, unspecified: Secondary | ICD-10-CM | POA: Diagnosis not present

## 2015-08-02 DIAGNOSIS — L110 Acquired keratosis follicularis: Secondary | ICD-10-CM | POA: Diagnosis not present

## 2015-08-22 DIAGNOSIS — I10 Essential (primary) hypertension: Secondary | ICD-10-CM | POA: Diagnosis not present

## 2015-09-28 IMAGING — RF IR CV CATH INJECTION
1 series · 15 of 19 positions shown · non-contrast
Comparison: None.

CLINICAL DATA: intra op Angiogram

EXAM:
CV CATH INJECTION

[Series 1: run · 5 acquisitions, 15 frames shown]
[im 1/5]
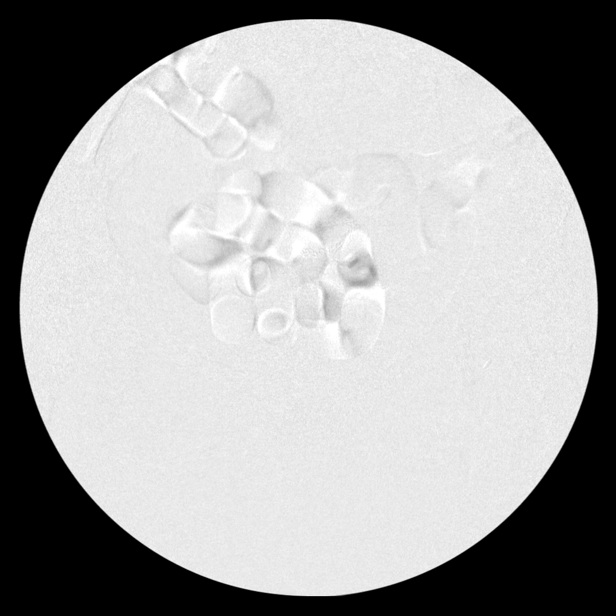
[im 1/5]
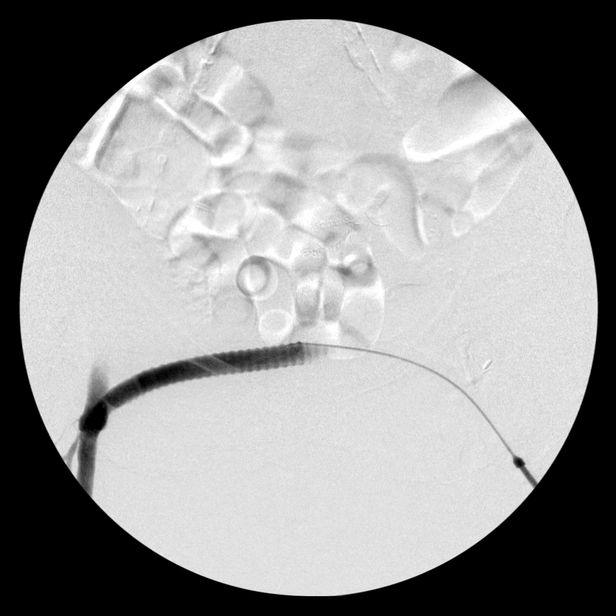
[im 1/5]
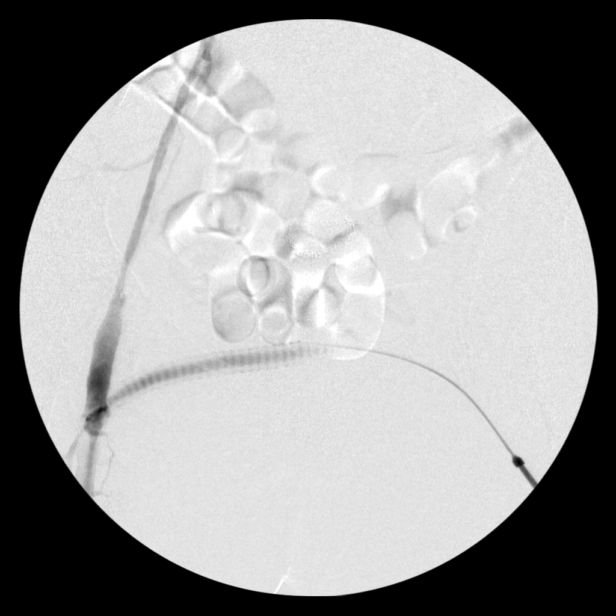
[im 2/5]
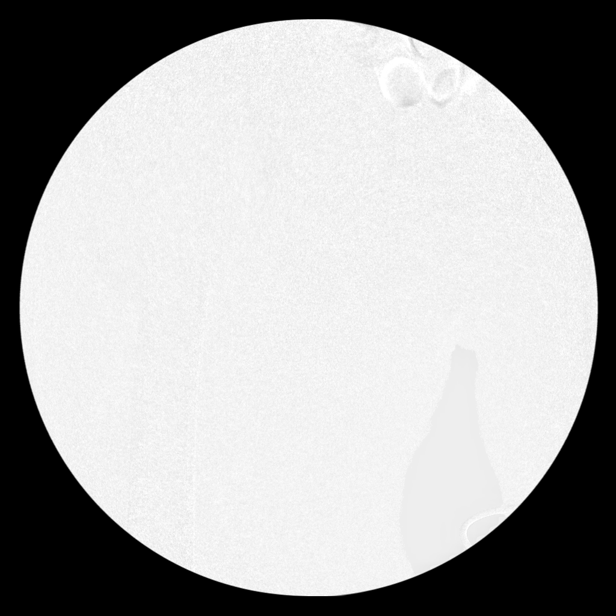
[im 2/5]
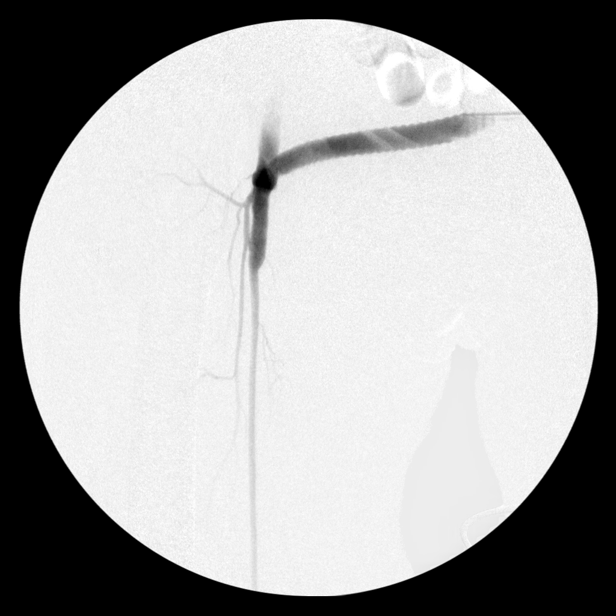
[im 2/5]
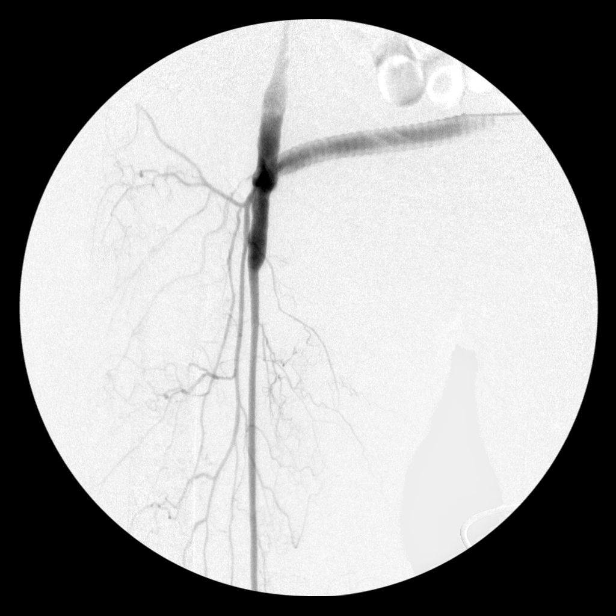
[im 3/5]
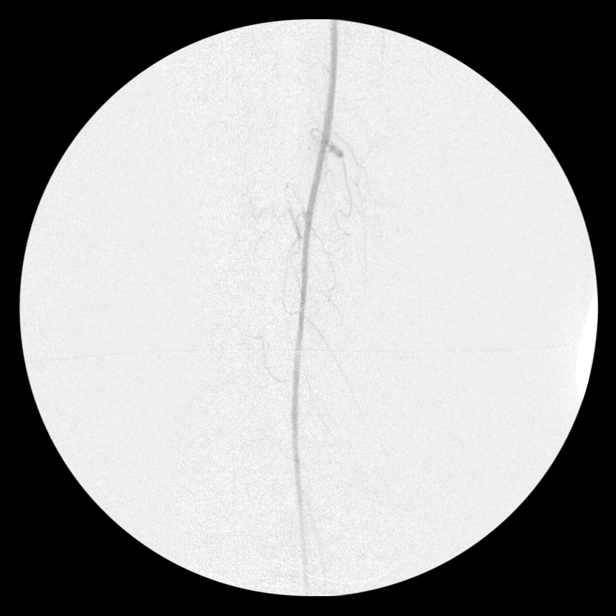
[im 3/5]
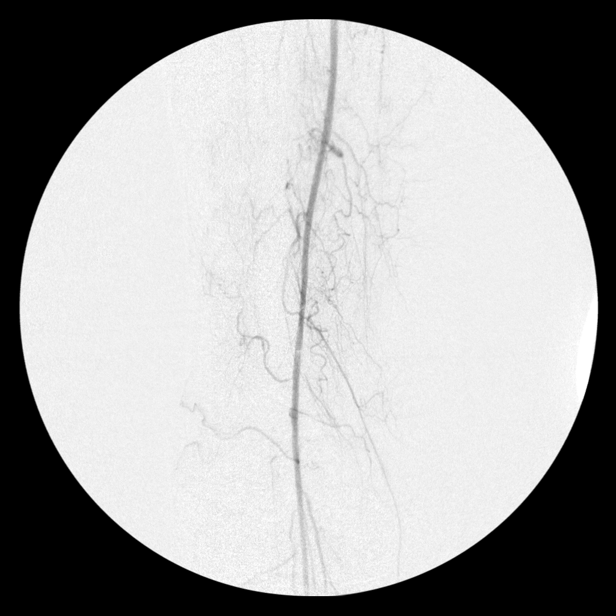
[im 3/5]
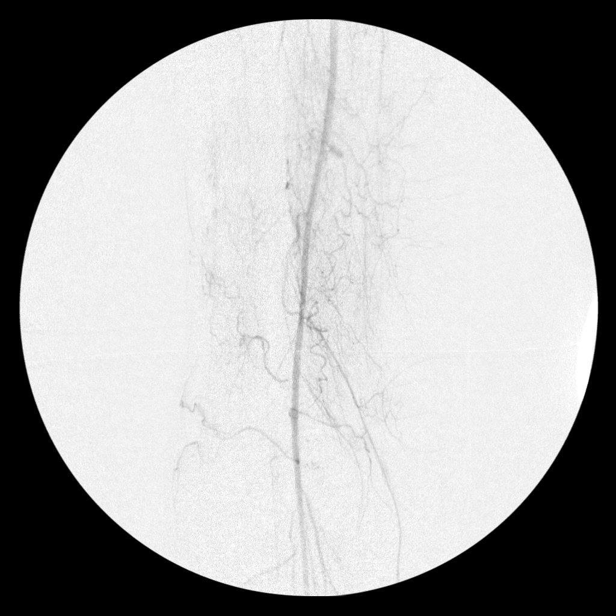
[im 4/5]
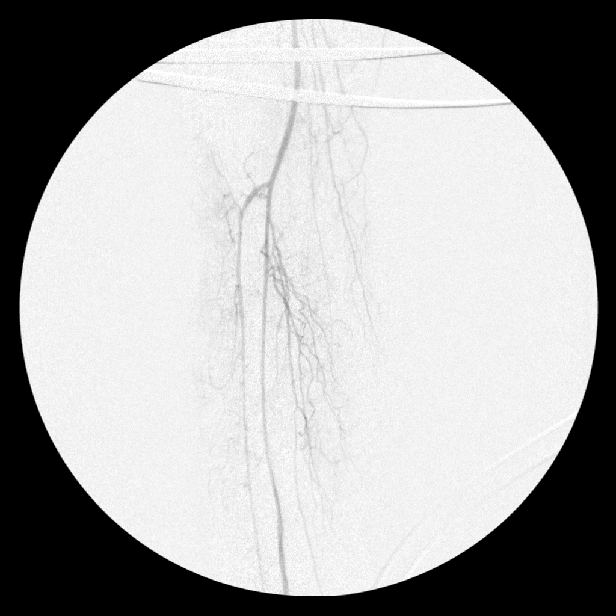
[im 4/5]
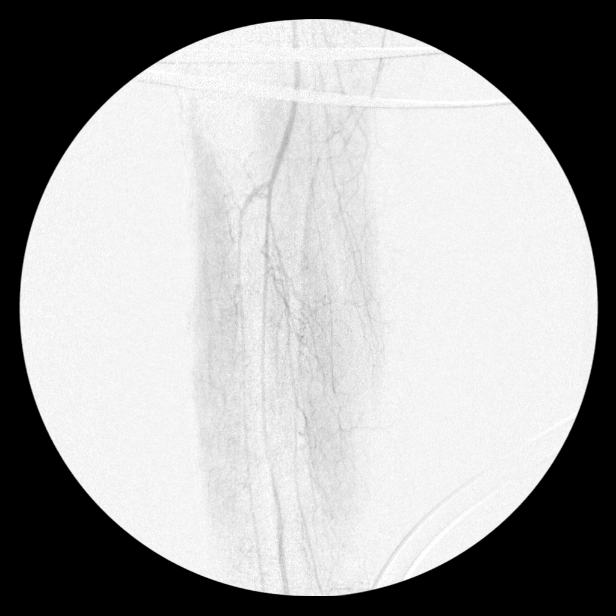
[im 4/5]
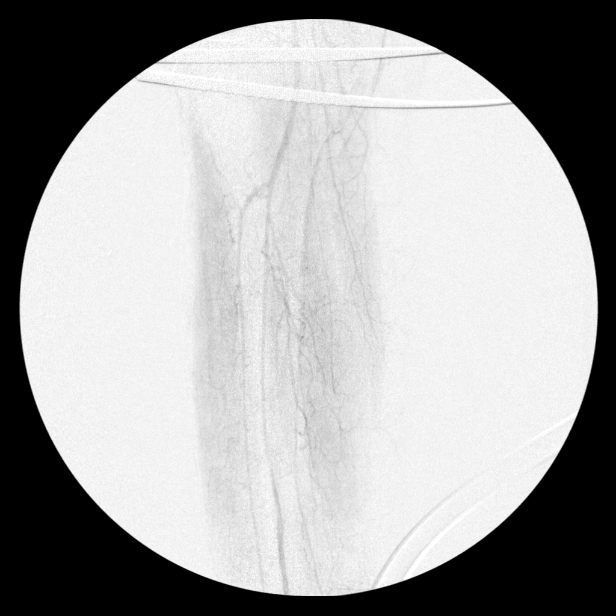
[im 5/5]
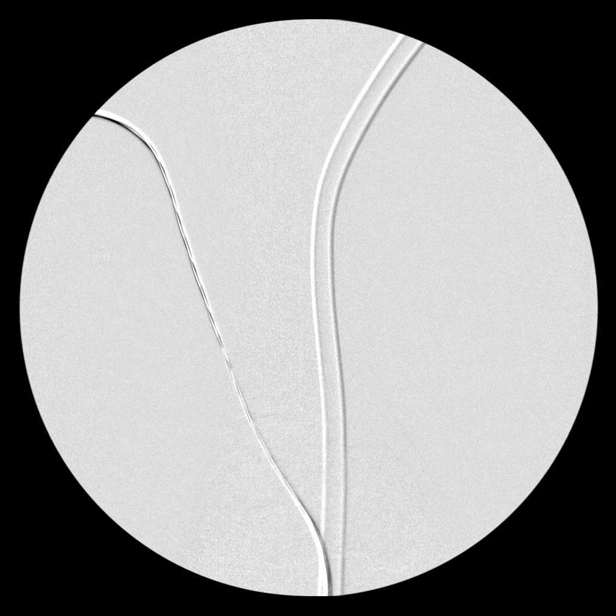
[im 5/5]
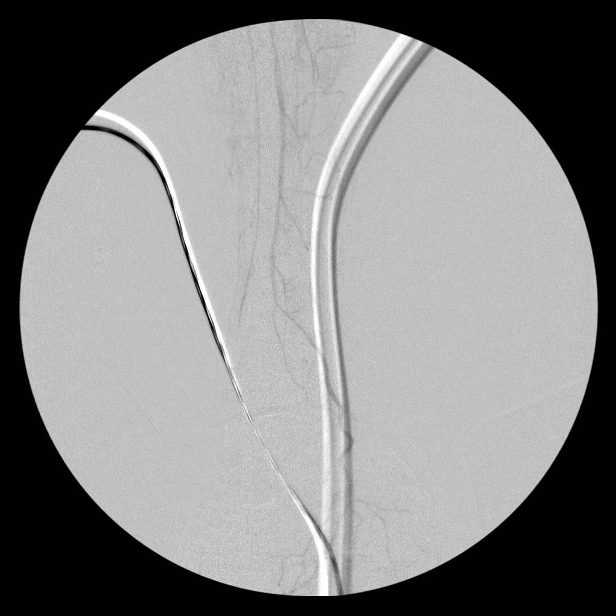
[im 5/5]
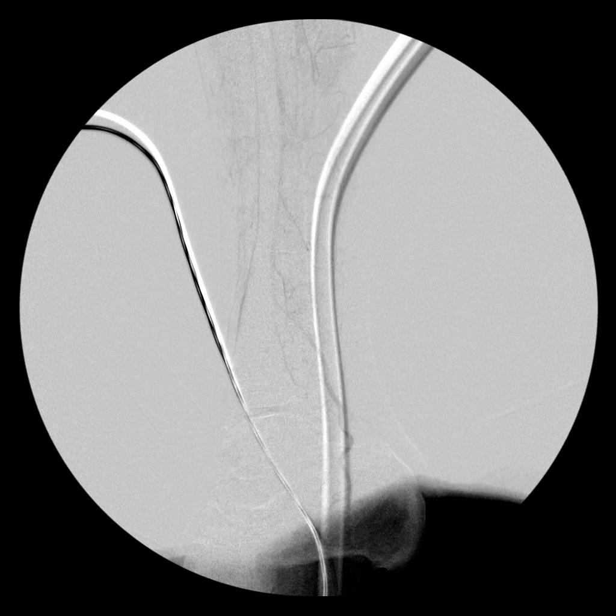

[15 of 19 positions shown; findings below may reference images not displayed]

FINDINGS: A series of 5 intraoperative C-arm DSA runs were submitted.

First demonstrates patent fem-fem graft and distal (right)
anastomosis.

Second demonstrates patency of the proximal right SFA.

Third demonstrates patency of distal SFA/popliteal artery . Non
subtracted images were not submitted for anatomic correlation.

Fourth shows patent proximal 3 vessel tibial runoff.

Final run shows widely patent distal peroneal artery with a
posterior communicating branch as the primary runoff across the
ankle to supply the foot.
IMPRESSION: Patent fem-fem graft with peroneal runoff.

## 2015-10-11 DIAGNOSIS — I739 Peripheral vascular disease, unspecified: Secondary | ICD-10-CM | POA: Diagnosis not present

## 2015-10-11 DIAGNOSIS — L110 Acquired keratosis follicularis: Secondary | ICD-10-CM | POA: Diagnosis not present

## 2015-10-11 DIAGNOSIS — B351 Tinea unguium: Secondary | ICD-10-CM | POA: Diagnosis not present

## 2015-12-20 ENCOUNTER — Encounter: Payer: Self-pay | Admitting: Family

## 2015-12-26 ENCOUNTER — Ambulatory Visit (HOSPITAL_COMMUNITY)
Admission: RE | Admit: 2015-12-26 | Discharge: 2015-12-26 | Disposition: A | Discharge status: Home / Self Care | Payer: Medicare Other | Source: Ambulatory Visit | Attending: Vascular Surgery | Admitting: Vascular Surgery

## 2015-12-26 ENCOUNTER — Other Ambulatory Visit: Payer: Self-pay

## 2015-12-26 ENCOUNTER — Encounter: Payer: Self-pay | Admitting: Family

## 2015-12-26 ENCOUNTER — Ambulatory Visit (INDEPENDENT_AMBULATORY_CARE_PROVIDER_SITE_OTHER)
Admission: RE | Admit: 2015-12-26 | Discharge: 2015-12-26 | Disposition: A | Discharge status: Home / Self Care | Payer: Medicare Other | Source: Ambulatory Visit | Attending: Family | Admitting: Family

## 2015-12-26 ENCOUNTER — Ambulatory Visit (INDEPENDENT_AMBULATORY_CARE_PROVIDER_SITE_OTHER): Payer: Medicare Other | Admitting: Family

## 2015-12-26 VITALS — BP 156/60 | HR 52 | Temp 97.0°F | Ht 61.0 in | Wt 105.9 lb

## 2015-12-26 DIAGNOSIS — Z87891 Personal history of nicotine dependence: Secondary | ICD-10-CM

## 2015-12-26 DIAGNOSIS — Z95828 Presence of other vascular implants and grafts: Secondary | ICD-10-CM

## 2015-12-26 DIAGNOSIS — I779 Disorder of arteries and arterioles, unspecified: Secondary | ICD-10-CM

## 2015-12-26 DIAGNOSIS — I6523 Occlusion and stenosis of bilateral carotid arteries: Secondary | ICD-10-CM

## 2015-12-26 DIAGNOSIS — I10 Essential (primary) hypertension: Secondary | ICD-10-CM | POA: Insufficient documentation

## 2015-12-26 DIAGNOSIS — Z48812 Encounter for surgical aftercare following surgery on the circulatory system: Secondary | ICD-10-CM | POA: Insufficient documentation

## 2015-12-26 DIAGNOSIS — I739 Peripheral vascular disease, unspecified: Secondary | ICD-10-CM | POA: Diagnosis present

## 2015-12-26 DIAGNOSIS — I998 Other disorder of circulatory system: Secondary | ICD-10-CM

## 2015-12-26 DIAGNOSIS — I70229 Atherosclerosis of native arteries of extremities with rest pain, unspecified extremity: Secondary | ICD-10-CM

## 2015-12-26 NOTE — Progress Notes (Signed)
[facility] [place] HISTORY AND PHYSICAL -PAD  History of Present Illness Mary [name] is a 82 y.o. female patient of Dr. Bridgett Larsson who presents with chief complaint: pain and redness in the right great toe that she first noticed a couple of weeks ago; this is not getting worse. She denies any claudication symptoms with walking, the pain in the sole of her right foot remains unchanged since the below bypass graft surgery.  Previous operation(s) complete on 07/05/14 include: Right iliofem EA w/ BPA, L to R fem-fem BPG. The patient's symptoms have resolved. The patient's symptoms are: mild arthritic pain with ambulation. She has some pain in left foot that remains, but the pain in her right calf has resolved. She has no claudication symptoms in her left LE. She denies non healing wounds. The patient's treatment regimen currently included: maximal medical management.  Pt denies any history of stroke or TIA.  I last saw pt on 07/13/15. At that time the patient's symptoms had resolved. She had no signs of ischemia in her feet or legs.  07/13/15 aortoiliac Duplex suggested a patent femoral-femoral bypass graft with no evidence of stenosis and greater than 50% stenosis of the left external iliac artery. Inflow to bypass artery velocity was 237 cm/sec. No significant changes noted when compared to previous exam of 12/08/2014.  ABI's were not performed. Carotid Duplex that day suggested minimal bilateral ICA stenosis.  She was to follow up in 6 with the following tests: BLE ABI, aortoiliac duplex. Carotid Duplex 1 year.  Pt Diabetic: No Pt smoker: former smoker, quit in the 1980's   Pt meds include: Statin :No, takes Red Yeast Rice Betablocker: Yes ASA: Yes Other anticoagulants/antiplatelets: no      Past Medical History  Diagnosis Date  . Hypertension   . Osteoporosis   . Pneumonia   . Anemia   . Arthritis   . Cancer     colon cancer  . Peripheral vascular disease    . Rate-related bundle branch block 07/04/2014    Social History Social History  Substance Use Topics  . Smoking status: Former Smoker    Quit date: 03/19/1979  . Smokeless tobacco: Never Used  . Alcohol Use: No    Family History Family History  Problem Relation Age of Onset  . Stroke Mother   . Heart disease Mother   . Diabetes Mother   . Hypertension Mother   . Hypertension Sister   . Cancer Brother   . Heart attack Brother   . Cancer Daughter   . Hyperlipidemia Daughter   . Cancer Son   . Cancer Daughter   . Cancer Son     Past Surgical History  Procedure Laterality Date  . Cholecystectomy    . Colon surgery      colon cancer  . Tubal ligation    . Colonoscopy  03/19/2012    Procedure: COLONOSCOPY;  Surgeon: Jerene Bears, MD;  Location: [place];  Service: Gastroenterology;  Laterality: N/A;  . Esophagogastroduodenoscopy  03/19/2012    Procedure: ESOPHAGOGASTRODUODENOSCOPY (EGD);  Surgeon: Jerene Bears, MD;  Location: [place];  Service: Gastroenterology;  Laterality: N/A;  . Esophagogastroduodenoscopy  03/20/2012    Procedure: ESOPHAGOGASTRODUODENOSCOPY (EGD);  Surgeon: Jerene Bears, MD;  Location: [place];  Service: Gastroenterology;  Laterality: N/A;  . Colonoscopy  03/20/2012    Procedure: COLONOSCOPY;  Surgeon: Jerene Bears, MD;  Location: [place];  Service: Gastroenterology;  Laterality: N/A;  . Spine surgery  06-19-14  . Endarterectomy femoral Right 07/05/2014    Procedure: ENDARTERECTOMY FEMORAL;  Surgeon: Conrad Melville, MD;  Location: [place];  Service: Vascular;  Laterality: Right;  . Femoral-femoral bypass graft Bilateral 07/05/2014    Procedure: BYPASS GRAFT FEMORAL-FEMORAL ARTERY;  Surgeon: Conrad [place]boro, MD;  Location: [place];  Service: Vascular;  Laterality: Bilateral;  . Patch angioplasty Right 07/05/2014    Procedure: PATCH ANGIOPLASTY-Right Femoral Artery;  Surgeon: Conrad Ferguson, MD;  Location: [place];  Service: Vascular;  Laterality: Right;   . Abdominal aortagram N/A 07/03/2014    Procedure: ABDOMINAL Maxcine Ham;  Surgeon: Conrad , MD;  Location: [facility] CATH LAB;  Service: Cardiovascular;  Laterality: N/A;    Allergies  Allergen Reactions  . Percocet [Oxycodone-Acetaminophen] Other (See Comments)    Severe HALLUCINATIONS    Current Outpatient Prescriptions  Medication Sig Dispense Refill  . aspirin 81 MG chewable tablet Chew 1 tablet (81 mg total) by mouth daily.    . Calcium Carbonate-Vitamin D (CALCIUM + D PO) Take 1 tablet by mouth 2 (two) times daily.    Marland Kitchen losartan-hydrochlorothiazide (HYZAAR) 100-25 MG per tablet Take 1 tablet by mouth daily.    . metoprolol succinate (TOPROL-XL) 50 MG 24 hr tablet Take 50 mg by mouth daily. Take with or immediately following a meal.    . Red Yeast Rice 600 MG TABS Take 1 tablet by mouth daily.     No current facility-administered medications for this visit.    ROS: See HPI for pertinent positives and negatives.   Physical Examination  Filed Vitals:   12/26/15 1012 12/26/15 1013  BP: 159/62 156/60  Pulse: 52   Temp: 97 F (36.1 C)   TempSrc: Oral   Height: 5\' 1"  (1.549 m)   Weight: 105 lb 14.4 oz (48.036 kg)   SpO2: 98%    Body mass index is 20.02 kg/(m^2).   General: A&O x 3, WD, thin  Pulmonary: Sym exp, good air movt, CTAB, no rales, rhonchi, & wheezing  Cardiac: RRR, Nl S1, S2, no detected murmur  Vascular: Vessel Right Left  Radial Palpable Palpable  Brachial Palpable Palpable  Carotid Palpable, with bruit Palpable, with bruit  Aorta Not palpable N/A  Femoral Palpable Palpable  Popliteal Not palpable Not palpable  PT not Palpable, audible by Doppler not Palpable, audible by Doppler  DP not Palpable, + Dopplerablesignal not Palpable, + Dopplerablesignal   Gastrointestinal: soft, NTND, -G/R, - HSM, - palpable masses, - CVAT B.  Fem-fem bypass graft is palpable at pubis.  Musculoskeletal: M/S 5/5  throughout , Extremities without ischemic changes, both groins well healed. Moderate kyphosis.  Right great toe and second toe distal phalangeal joints have moderate erythema, no swelling. Erythema in great toe extends distally to below the toenail cuticle, not tender to touch. No gangrene, no open wounds.  Neurologic: Pain and light touch intact in extremities , Motor exam as listed above               Non-Invasive Vascular Imaging: DATE: 12/26/2015 AORTO-ILIAC DUPLEX LIMITED EVALUATION  FEMORAL-FEMORAL BYPASS GRAFT    INDICATION: Right great toe pain and redness    PREVIOUS INTERVENTION(S): Left to right femorofemoral bypass graft 07/05/2014    DUPLEX EXAM:     LOCATION Velocities                (cm/s) Diameter AP                          (  cm) Diameter Transverse (cm)  Inflow Artery        406-301    Anastomosis              271    Graft         30    Anastomosis          48    Outflow Artery      56       Right  Left  Today's ABI/TBI 0.70 0.68  Previous ABI/TBI   12/08/2014   0.83 0.75    If Ankle Brachial Index (ABI) or Toe Brachial Index (TBI) performed, please see complete report   ADDITIONAL FINDINGS: The right proximal superficial femoral artery stenosis; velocity and ratio criteria suggests 30-49% stenosis. Peak systolic velocity in this segment is 148 cm/s and significantly higher than the pre-stenosis velocity of 26 cm/s.    IMPRESSION: Widely patent left to right femorofemoral bypass graft with inflow and outflow disease. Please see the following diagram for exam details.    Compared to the previous exam:  Disease progression of the native inflow and outflow arteries.      ASSESSMENT: Mary [name] is a 82 y.o. female who is s/p right iliofem EA w/ BPA, L to R fem-fem BPG on 07/05/14.  Active 82 year old female noted erythema and pain in her right great and second toes about 2 weeks ago, has not progressed, no other signs of ischemia in her feet/legs.   Today's aortoiliac fem-fem bypass duplex suggests right proximal superficial femoral artery stenosis; velocity and ratio criteria suggests 30-49% stenosis. Peak systolic velocity in this segment is 148 cm/s and significantly higher than the pre-stenosis velocity of 26 cm/s. Widely patent left to right femorofemoral bypass graft with inflow (406-301 cm/s) and outflow disease.  Inflow to bypass artery velocity was 237 cm/sec on 07/13/15, which has increased significantly on today's exam.  Bilateral ABI's have also dropped, right more so than left.  Carotid Duplex on 07/13/15 suggests minimal bilateral ICA stenosis.   Her atherosclerotic risk factors are low: she does not have DM, she quit smoking in the 1980's, she is thin, and she is active.  She has one kidney (congenital); her last creatinine result on file was 0.91 on 07/04/14.  I discussed pt HPI, physical exam results, aortoiliac duplex results, and ABI's compared to previous ABI's and aortoiliac duplex results with Dr. Bridgett Larsson.  PLAN:  Based on the patient's vascular studies and examination, pt will be scheduled for aortogram, possible intervention left iliac artery with Dr. Bridgett Larsson  on 2/27 or 01/10/16.  I discussed in depth with the patient the nature of atherosclerosis, and emphasized the importance of maximal medical management including strict control of blood pressure, blood glucose, and lipid levels, obtaining regular exercise, and continued cessation of smoking.  The patient is aware that without maximal medical management the underlying atherosclerotic disease process will progress, limiting the benefit of any interventions.  The patient was given information about PAD including signs, symptoms, treatment, what symptoms should prompt the patient to seek immediate medical care, and risk reduction measures to take.  Clemon Chambers, RN, MSN, FNP-C [facility] Phone: [phone]  Clinic MD:  Bridgett Larsson  12/26/2015 10:08 AM

## 2015-12-26 NOTE — Patient Instructions (Signed)
Peripheral Vascular Disease Peripheral vascular disease (PVD) is a disease of the blood vessels that are not part of your heart and brain. A simple term for PVD is poor circulation. In most cases, PVD narrows the blood vessels that carry blood from your heart to the rest of your body. This can result in a decreased supply of blood to your arms, legs, and internal organs, like your stomach or kidneys. However, it most often affects a person's lower legs and feet. There are two types of PVD.  Organic PVD. This is the more common type. It is caused by damage to the structure of blood vessels.  Functional PVD. This is caused by conditions that make blood vessels contract and tighten (spasm). Without treatment, PVD tends to get worse over time. PVD can also lead to acute ischemic limb. This is when an arm or limb suddenly has trouble getting enough blood. This is a medical emergency. CAUSES Each type of PVD has many different causes. The most common cause of PVD is buildup of a fatty material (plaque) inside of your arteries (atherosclerosis). Small amounts of plaque can break off from the walls of the blood vessels and become lodged in a smaller artery. This blocks blood flow and can cause acute ischemic limb. Other common causes of PVD include:  Blood clots that form inside of blood vessels.  Injuries to blood vessels.  Diseases that cause inflammation of blood vessels or cause blood vessel spasms.  Health behaviors and health history that increase your risk of developing PVD. RISK FACTORS  You may have a greater risk of PVD if you:  Have a family history of PVD.  Have certain medical conditions, including:  High cholesterol.  Diabetes.  High blood pressure (hypertension).  Coronary heart disease.  Past problems with blood clots.  Past injury, such as burns or a broken bone. These may have damaged blood vessels in your limbs.  Buerger disease. This is caused by inflamed blood  vessels in your hands and feet.  Some forms of arthritis.  Rare birth defects that affect the arteries in your legs.  Use tobacco.  Do not get enough exercise.  Are obese.  Are age 50 or older. SIGNS AND SYMPTOMS  PVD may cause many different symptoms. Your symptoms depend on what part of your body is not getting enough blood. Some common signs and symptoms include:  Cramps in your lower legs. This may be a symptom of poor leg circulation (claudication).  Pain and weakness in your legs while you are physically active that goes away when you rest (intermittent claudication).  Leg pain when at rest.  Leg numbness, tingling, or weakness.  Coldness in a leg or foot, especially when compared with the other leg.  Skin or hair changes. These can include:  Hair loss.  Shiny skin.  Pale or bluish skin.  Thick toenails.  Inability to get or maintain an erection (erectile dysfunction). People with PVD are more prone to developing ulcers and sores on their toes, feet, or legs. These may take longer than normal to heal. DIAGNOSIS Your health care provider may diagnose PVD from your signs and symptoms. The health care provider will also do a physical exam. You may have tests to find out what is causing your PVD and determine its severity. Tests may include:  Blood pressure recordings from your arms and legs and measurements of the strength of your pulses (pulse volume recordings).  Imaging studies using sound waves to take pictures of   the blood flow through your blood vessels (Doppler ultrasound).  Injecting a dye into your blood vessels before having imaging studies using:  X-rays (angiogram or arteriogram).  Computer-generated X-rays (CT angiogram).  A powerful electromagnetic field and a computer (magnetic resonance angiogram or MRA). TREATMENT Treatment for PVD depends on the cause of your condition and the severity of your symptoms. It also depends on your age. Underlying  causes need to be treated and controlled. These include long-lasting (chronic) conditions, such as diabetes, high cholesterol, and high blood pressure. You may need to first try making lifestyle changes and taking medicines. Surgery may be needed if these do not work. Lifestyle changes may include:  Quitting smoking.  Exercising regularly.  Following a low-fat, low-cholesterol diet. Medicines may include:  Blood thinners to prevent blood clots.  Medicines to improve blood flow.  Medicines to improve your blood cholesterol levels. Surgical procedures may include:  A procedure that uses an inflated balloon to open a blocked artery and improve blood flow (angioplasty).  A procedure to put in a tube (stent) to keep a blocked artery open (stent implant).  Surgery to reroute blood flow around a blocked artery (peripheral bypass surgery).  Surgery to remove dead tissue from an infected wound on the affected limb.  Amputation. This is surgical removal of the affected limb. This may be necessary in cases of acute ischemic limb that are not improved through medical or surgical treatments. HOME CARE INSTRUCTIONS  Take medicines only as directed by your health care provider.  Do not use any tobacco products, including cigarettes, chewing tobacco, or electronic cigarettes. If you need help quitting, ask your health care provider.  Lose weight if you are overweight, and maintain a healthy weight as directed by your health care provider.  Eat a diet that is low in fat and cholesterol. If you need help, ask your health care provider.  Exercise regularly. Ask your health care provider to suggest some good activities for you.  Use compression stockings or other mechanical devices as directed by your health care provider.  Take good care of your feet.  Wear comfortable shoes that fit well.  Check your feet often for any cuts or sores. SEEK MEDICAL CARE IF:  You have cramps in your legs  while walking.  You have leg pain when you are at rest.  You have coldness in a leg or foot.  Your skin changes.  You have erectile dysfunction.  You have cuts or sores on your feet that are not healing. SEEK IMMEDIATE MEDICAL CARE IF:  Your arm or leg turns cold and blue.  Your arms or legs become red, warm, swollen, painful, or numb.  You have chest pain or trouble breathing.  You suddenly have weakness in your face, arm, or leg.  You become very confused or lose the ability to speak.  You suddenly have a very bad headache or lose your vision.   This information is not intended to replace advice given to you by your health care provider. Make sure you discuss any questions you have with your health care provider.   Document Released: 12/04/2004 Document Revised: 11/17/2014 Document Reviewed: 04/06/2014 Elsevier Interactive Patient Education 2016 [employer]  

## 2016-01-04 DIAGNOSIS — L03319 Cellulitis of trunk, unspecified: Secondary | ICD-10-CM | POA: Diagnosis not present

## 2016-01-04 DIAGNOSIS — R52 Pain, unspecified: Secondary | ICD-10-CM | POA: Diagnosis not present

## 2016-01-04 DIAGNOSIS — M79671 Pain in right foot: Secondary | ICD-10-CM | POA: Diagnosis not present

## 2016-01-04 DIAGNOSIS — R2241 Localized swelling, mass and lump, right lower limb: Secondary | ICD-10-CM | POA: Diagnosis not present

## 2016-01-05 ENCOUNTER — Emergency Department (HOSPITAL_COMMUNITY)
Admission: EM | Admit: 2016-01-05 | Discharge: 2016-01-05 | Disposition: A | Discharge status: Home / Self Care | Payer: Medicare Other | Attending: Emergency Medicine | Admitting: Emergency Medicine

## 2016-01-05 DIAGNOSIS — Z862 Personal history of diseases of the blood and blood-forming organs and certain disorders involving the immune mechanism: Secondary | ICD-10-CM | POA: Insufficient documentation

## 2016-01-05 DIAGNOSIS — Z79899 Other long term (current) drug therapy: Secondary | ICD-10-CM | POA: Insufficient documentation

## 2016-01-05 DIAGNOSIS — Z9861 Coronary angioplasty status: Secondary | ICD-10-CM | POA: Diagnosis not present

## 2016-01-05 DIAGNOSIS — M810 Age-related osteoporosis without current pathological fracture: Secondary | ICD-10-CM | POA: Insufficient documentation

## 2016-01-05 DIAGNOSIS — Z8701 Personal history of pneumonia (recurrent): Secondary | ICD-10-CM | POA: Diagnosis not present

## 2016-01-05 DIAGNOSIS — Z7982 Long term (current) use of aspirin: Secondary | ICD-10-CM | POA: Insufficient documentation

## 2016-01-05 DIAGNOSIS — M1990 Unspecified osteoarthritis, unspecified site: Secondary | ICD-10-CM | POA: Diagnosis not present

## 2016-01-05 DIAGNOSIS — Z85038 Personal history of other malignant neoplasm of large intestine: Secondary | ICD-10-CM | POA: Insufficient documentation

## 2016-01-05 DIAGNOSIS — I10 Essential (primary) hypertension: Secondary | ICD-10-CM | POA: Insufficient documentation

## 2016-01-05 DIAGNOSIS — M79674 Pain in right toe(s): Secondary | ICD-10-CM

## 2016-01-05 MED ORDER — DOXYCYCLINE HYCLATE 100 MG PO TABS
100.0000 mg | ORAL_TABLET | Freq: Once | ORAL | Status: AC
  Administered 2016-01-05: 100 mg via ORAL
  Filled 2016-01-05: qty 1

## 2016-01-05 MED ORDER — CEPHALEXIN 500 MG PO CAPS: 500.0000 mg | ORAL_CAPSULE | Freq: Three times a day (TID) | ORAL | Status: DC

## 2016-01-05 NOTE — Discharge Instructions (Signed)
Take the antibiotic until gone. Keep your appointment this coming week with Dr. Bridgett Larsson. Return to the emergency room if you get a fever, or if the redness extends up into your foot or you see a red streak going up her foot or leg.

## 2016-01-05 NOTE — ED Notes (Signed)
Right great toe swollen, red, and pus under nail. Started three days ago, and is going up into her foot.

## 2016-01-05 NOTE — ED Provider Notes (Signed)
CSN: [id number]     Arrival date & time 01/05/16  0020 History   First MD Initiated Contact with Patient 01/05/16 0210   Chief Complaint  Patient presents with  . Cellulitis     (Consider location/radiation/quality/duration/timing/severity/associated sxs/prior Treatment) HPI patient states she had a femoropopliteal bypass done in August 2015. She states her right great toe has been red for about 1 week. She states it's been painful the past few nights and she has to get up and walk hoping that will increase circulation and help the pain. She denies any known trauma to the toe. She states it turned white several days ago. She denies any fever, chills, but she states her foot feels warm. She has an appointment to be seen by her vascular doctor next week.  PCP Dr Dub Am[facility] Dr Bridgett Larsson  Past Medical History  Diagnosis Date  . Hypertension   . Osteoporosis   . Pneumonia   . Anemia   . Arthritis   . Cancer [facility])     colon cancer  . Peripheral vascular disease ([place])   . Rate-related bundle branch block 07/04/2014   Past Surgical History  Procedure Laterality Date  . Cholecystectomy    . Colon surgery      colon cancer  . Tubal ligation    . Colonoscopy  03/19/2012    Procedure: COLONOSCOPY;  Surgeon: Jerene Bears, MD;  Location: [place];  Service: Gastroenterology;  Laterality: N/A;  . Esophagogastroduodenoscopy  03/19/2012    Procedure: ESOPHAGOGASTRODUODENOSCOPY (EGD);  Surgeon: Jerene Bears, MD;  Location: [place];  Service: Gastroenterology;  Laterality: N/A;  . Esophagogastroduodenoscopy  03/20/2012    Procedure: ESOPHAGOGASTRODUODENOSCOPY (EGD);  Surgeon: Jerene Bears, MD;  Location: [place];  Service: Gastroenterology;  Laterality: N/A;  . Colonoscopy  03/20/2012    Procedure: COLONOSCOPY;  Surgeon: Jerene Bears, MD;  Location: [place];  Service: Gastroenterology;  Laterality: N/A;  . Spine surgery  06-19-14  . Endarterectomy femoral Right 07/05/2014   Procedure: ENDARTERECTOMY FEMORAL;  Surgeon: Conrad [place], MD;  Location: [place];  Service: Vascular;  Laterality: Right;  . Femoral-femoral bypass graft Bilateral 07/05/2014    Procedure: BYPASS GRAFT FEMORAL-FEMORAL ARTERY;  Surgeon: Conrad Spurgeon, MD;  Location: [place];  Service: Vascular;  Laterality: Bilateral;  . Patch angioplasty Right 07/05/2014    Procedure: PATCH ANGIOPLASTY-Right Femoral Artery;  Surgeon: Conrad [place], MD;  Location: [place];  Service: Vascular;  Laterality: Right;  . Abdominal aortagram N/A 07/03/2014    Procedure: ABDOMINAL Maxcine Ham;  Surgeon: Conrad Crosby, MD;  Location: [facility] CATH LAB;  Service: Cardiovascular;  Laterality: N/A;   Family History  Problem Relation Age of Onset  . Stroke Mother   . Heart disease Mother   . Diabetes Mother   . Hypertension Mother   . Hypertension Sister   . Cancer Brother   . Heart attack Brother   . Cancer Daughter   . Hyperlipidemia Daughter   . Cancer Son   . Cancer Daughter   . Cancer Son    Social History  Substance Use Topics  . Smoking status: Former Smoker    Quit date: 03/19/1979  . Smokeless tobacco: Never Used  . Alcohol Use: No   Lives alone Uses a cane Still drives  OB History    No data available     Review of Systems  All other systems reviewed and are negative.     Allergies  Percocet  Home Medications  Prior to Admission medications   Medication Sig Start Date End Date Taking? Authorizing Provider  aspirin 81 MG chewable tablet Chew 1 tablet (81 mg total) by mouth daily. 07/10/14  Yes Alvia Grove, PA-C  Calcium Carbonate-Vitamin D (CALCIUM + D PO) Take 1 tablet by mouth 2 (two) times daily.   Yes Historical Provider, MD  losartan-hydrochlorothiazide (HYZAAR) 100-25 MG per tablet Take 1 tablet by mouth daily.   Yes Historical Provider, MD  metoprolol succinate (TOPROL-XL) 50 MG 24 hr tablet Take 50 mg by mouth daily. Take with or immediately following a meal.   Yes Historical Provider, MD    cephALEXin (KEFLEX) 500 MG capsule Take 1 capsule (500 mg total) by mouth 3 (three) times daily. 01/05/16   Rolland Porter, MD  Red Yeast Rice 600 MG TABS Take 1 tablet by mouth daily.    Historical Provider, MD   BP 188/81 mmHg  Pulse 55  Temp(Src) 98.5 F (36.9 C) (Oral)  Resp 20  Ht 5\' 1"  (1.549 m)  Wt 104 lb (47.174 kg)  BMI 19.66 kg/m2  SpO2 100% Physical Exam  Constitutional: She is oriented to person, place, and time. She appears well-developed and well-nourished.  Non-toxic appearance. She does not appear ill. No distress.  HENT:  Head: Normocephalic and atraumatic.  Right Ear: External ear normal.  Left Ear: External ear normal.  Nose: Nose normal. No mucosal edema or rhinorrhea.  Mouth/Throat: Mucous membranes are normal. No dental abscesses or uvula swelling.  Eyes: Conjunctivae and EOM are normal.  Neck: Normal range of motion and full passive range of motion without pain.  Pulmonary/Chest: Effort normal. No respiratory distress. She has no rhonchi. She exhibits no crepitus.  Abdominal: Soft. Normal appearance.  Musculoskeletal: Normal range of motion. She exhibits no edema or tenderness.  Moves all extremities well.  Do not feel any dorsalis pedis or posterior tibialis pulse in her right foot. Her right great toe has some diffuse redness. She has fungal type changes around all of her toenails.  Neurological: She is alert and oriented to person, place, and time. She has normal strength. No cranial nerve deficit.  Skin: Skin is warm, dry and intact. No rash noted. No erythema. No pallor.  Psychiatric: She has a normal mood and affect. Her speech is normal and behavior is normal. Her mood appears not anxious.  Nursing note and vitals reviewed.      ED Course  Procedures (including critical care time)  Medications  doxycycline (VIBRA-TABS) tablet 100 mg (not administered)    Reviewing patient's prior note she was seen by the vascular PA on February 15. At that time she  described her right toe would been red for several weeks. They document she had no palpable posterior tibial or dorsalis pedis pulse but she did have pulses by Doppler. They document her toe was red without swelling. There are plan was to get a aortogram done which evidently is what she's having done this coming week. EMS told patient they thought she had infection of her toe. Patient was started on antibiotics the white around her toenail appears to be a fungal type problem that she has around all of her toenails. Her toe actually looks pretty similar to what they describe at her vascular doctor's visit on the 15th.   BMET    Component Value Date/Time   NA 137 07/10/2014 0540   K 4.0 07/10/2014 0540   CL 101 07/10/2014 0540   CO2 24 07/10/2014 0540  GLUCOSE 96 07/10/2014 0540   BUN 12 07/10/2014 0540   CREATININE 0.60 07/10/2014 0540   CALCIUM 8.5 07/10/2014 0540   GFRNONAA 79* 07/10/2014 0540   GFRAA >90 07/10/2014 0540      MDM   Final diagnoses:  Pain of toe of right foot    New Prescriptions   CEPHALEXIN (KEFLEX) 500 MG CAPSULE    Take 1 capsule (500 mg total) by mouth 3 (three) times daily.    Plan discharge  Rolland Porter, MD, Barbette Or, MD 01/05/16 [phone]

## 2016-01-10 ENCOUNTER — Encounter (HOSPITAL_COMMUNITY)
Admission: RE | Disposition: A | Discharge status: Home / Self Care | Payer: Self-pay | Source: Ambulatory Visit | Attending: Vascular Surgery

## 2016-01-10 ENCOUNTER — Ambulatory Visit (HOSPITAL_COMMUNITY)
Admission: RE | Admit: 2016-01-10 | Discharge: 2016-01-10 | Disposition: A | Discharge status: Home / Self Care | Payer: Medicare Other | Source: Ambulatory Visit | Attending: Vascular Surgery | Admitting: Vascular Surgery

## 2016-01-10 DIAGNOSIS — I10 Essential (primary) hypertension: Secondary | ICD-10-CM | POA: Insufficient documentation

## 2016-01-10 DIAGNOSIS — Q600 Renal agenesis, unilateral: Secondary | ICD-10-CM | POA: Diagnosis not present

## 2016-01-10 DIAGNOSIS — Z87891 Personal history of nicotine dependence: Secondary | ICD-10-CM | POA: Insufficient documentation

## 2016-01-10 DIAGNOSIS — I700 Atherosclerosis of aorta: Secondary | ICD-10-CM | POA: Diagnosis not present

## 2016-01-10 DIAGNOSIS — M1990 Unspecified osteoarthritis, unspecified site: Secondary | ICD-10-CM | POA: Diagnosis not present

## 2016-01-10 DIAGNOSIS — Z85038 Personal history of other malignant neoplasm of large intestine: Secondary | ICD-10-CM | POA: Diagnosis not present

## 2016-01-10 DIAGNOSIS — D649 Anemia, unspecified: Secondary | ICD-10-CM | POA: Insufficient documentation

## 2016-01-10 DIAGNOSIS — Z8249 Family history of ischemic heart disease and other diseases of the circulatory system: Secondary | ICD-10-CM | POA: Insufficient documentation

## 2016-01-10 DIAGNOSIS — Z7982 Long term (current) use of aspirin: Secondary | ICD-10-CM | POA: Diagnosis not present

## 2016-01-10 DIAGNOSIS — I70269 Atherosclerosis of native arteries of extremities with gangrene, unspecified extremity: Secondary | ICD-10-CM | POA: Diagnosis present

## 2016-01-10 DIAGNOSIS — I70213 Atherosclerosis of native arteries of extremities with intermittent claudication, bilateral legs: Secondary | ICD-10-CM | POA: Insufficient documentation

## 2016-01-10 DIAGNOSIS — I745 Embolism and thrombosis of iliac artery: Secondary | ICD-10-CM | POA: Diagnosis not present

## 2016-01-10 HISTORY — PX: LOWER EXTREMITY ANGIOGRAM: SHX5508

## 2016-01-10 HISTORY — PX: PERIPHERAL VASCULAR CATHETERIZATION: SHX172C

## 2016-01-10 LAB — POCT I-STAT, CHEM 8
BUN: 20 mg/dL (ref 6–20)
CHLORIDE: 100 mmol/L — AB (ref 101–111)
CREATININE: 1 mg/dL (ref 0.44–1.00)
Calcium, Ion: 1.1 mmol/L — ABNORMAL LOW (ref 1.13–1.30)
Glucose, Bld: 97 mg/dL (ref 65–99)
HEMATOCRIT: 44 % (ref 36.0–46.0)
HEMOGLOBIN: 15 g/dL (ref 12.0–15.0)
POTASSIUM: 3.4 mmol/L — AB (ref 3.5–5.1)
Sodium: 140 mmol/L (ref 135–145)
TCO2: 26 mmol/L (ref 0–100)

## 2016-01-10 LAB — POCT ACTIVATED CLOTTING TIME
ACTIVATED CLOTTING TIME: 199 s
ACTIVATED CLOTTING TIME: 183 s
ACTIVATED CLOTTING TIME: 178 s
Activated Clotting Time: 209 seconds

## 2016-01-10 SURGERY — PERIPHERAL VASCULAR INTERVENTION

## 2016-01-10 MED ORDER — HEPARIN SODIUM (PORCINE) 1000 UNIT/ML IJ SOLN
INTRAMUSCULAR | Status: AC
Start: 2016-01-10 — End: 2016-01-10
  Filled 2016-01-10: qty 1

## 2016-01-10 MED ORDER — SODIUM CHLORIDE 0.9 % IV SOLN
INTRAVENOUS | Status: DC
  Administered 2016-01-10: 08:00:00 via INTRAVENOUS

## 2016-01-10 MED ORDER — IODIXANOL 320 MG/ML IV SOLN
INTRAVENOUS | Status: DC | PRN
  Administered 2016-01-10: 105 mL via INTRA_ARTERIAL

## 2016-01-10 MED ORDER — HEPARIN (PORCINE) IN NACL 2-0.9 UNIT/ML-% IJ SOLN
INTRAMUSCULAR | Status: AC
  Filled 2016-01-10: qty 1000

## 2016-01-10 MED ORDER — LIDOCAINE HCL (PF) 1 % IJ SOLN
INTRAMUSCULAR | Status: AC
  Filled 2016-01-10: qty 30

## 2016-01-10 MED ORDER — NITROGLYCERIN 1 MG/10 ML FOR IR/CATH LAB
INTRA_ARTERIAL | Status: AC
  Filled 2016-01-10: qty 10

## 2016-01-10 MED ORDER — FENTANYL CITRATE (PF) 100 MCG/2ML IJ SOLN
INTRAMUSCULAR | Status: AC
  Filled 2016-01-10: qty 2

## 2016-01-10 MED ORDER — HEPARIN (PORCINE) IN NACL 2-0.9 UNIT/ML-% IJ SOLN
INTRAMUSCULAR | Status: DC | PRN
  Administered 2016-01-10: 1000 mL

## 2016-01-10 MED ORDER — MIDAZOLAM HCL 2 MG/2ML IJ SOLN
INTRAMUSCULAR | Status: DC | PRN
  Administered 2016-01-10: 0.5 mg via INTRAVENOUS

## 2016-01-10 MED ORDER — ACETAMINOPHEN 325 MG PO TABS: 650.0000 mg | ORAL_TABLET | ORAL | Status: DC | PRN

## 2016-01-10 MED ORDER — FENTANYL CITRATE (PF) 100 MCG/2ML IJ SOLN
INTRAMUSCULAR | Status: DC | PRN
  Administered 2016-01-10: 25 ug via INTRAVENOUS

## 2016-01-10 MED ORDER — NITROGLYCERIN 1 MG/10 ML FOR IR/CATH LAB
INTRA_ARTERIAL | Status: DC | PRN
  Administered 2016-01-10: 8 mL

## 2016-01-10 MED ORDER — MIDAZOLAM HCL 2 MG/2ML IJ SOLN
INTRAMUSCULAR | Status: AC
  Filled 2016-01-10: qty 2

## 2016-01-10 MED ORDER — HEPARIN SODIUM (PORCINE) 1000 UNIT/ML IJ SOLN
INTRAMUSCULAR | Status: DC | PRN
  Administered 2016-01-10: 5000 [IU] via INTRAVENOUS

## 2016-01-10 MED ORDER — SODIUM CHLORIDE 0.9 % IV SOLN: Freq: Once | INTRAVENOUS | Status: DC

## 2016-01-10 MED ORDER — SODIUM CHLORIDE 0.9 % IV SOLN: 1.0000 mL/kg/h | INTRAVENOUS | Status: DC

## 2016-01-10 SURGICAL SUPPLY — 16 items
CATH OMNI FLUSH 5F 65CM (CATHETERS) ×2 IMPLANT
CATH STRAIGHT 5FR 65CM (CATHETERS) ×2 IMPLANT
COVER PRB 48X5XTLSCP FOLD TPE (BAG) IMPLANT
COVER PROBE 5X48 (BAG) ×5
KIT ENCORE 26 ADVANTAGE (KITS) ×2 IMPLANT
KIT MICROINTRODUCER STIFF 5F (SHEATH) ×2 IMPLANT
KIT PV (KITS) ×5 IMPLANT
SHEATH BRITE TIP 6FR 35CM (SHEATH) ×2 IMPLANT
SHEATH PINNACLE 5F 10CM (SHEATH) ×2 IMPLANT
STENT EXPRESS LD 6X27X75 (Permanent Stent) ×2 IMPLANT
SYR MEDRAD MARK V 150ML (SYRINGE) ×5 IMPLANT
TRANSDUCER W/STOPCOCK (MISCELLANEOUS) ×5 IMPLANT
TRAY PV CATH (CUSTOM PROCEDURE TRAY) ×5 IMPLANT
WIRE AMPLATZ SS-J .035X180CM (WIRE) ×2 IMPLANT
WIRE BENTSON .035X145CM (WIRE) ×2 IMPLANT
WIRE HITORQ VERSACORE ST 145CM (WIRE) ×2 IMPLANT

## 2016-01-10 NOTE — H&P (View-Only) (Signed)
[facility] [place] HISTORY AND PHYSICAL -PAD  History of Present Illness Mary [name] is a 82 y.o. female patient of Dr. Bridgett Larsson who presents with chief complaint: pain and redness in the right great toe that she first noticed a couple of weeks ago; this is not getting worse. She denies any claudication symptoms with walking, the pain in the sole of her right foot remains unchanged since the below bypass graft surgery.  Previous operation(s) complete on 07/05/14 include: Right iliofem EA w/ BPA, L to R fem-fem BPG. The patient's symptoms have resolved. The patient's symptoms are: mild arthritic pain with ambulation. She has some pain in left foot that remains, but the pain in her right calf has resolved. She has no claudication symptoms in her left LE. She denies non healing wounds. The patient's treatment regimen currently included: maximal medical management.  Pt denies any history of stroke or TIA.  I last saw pt on 07/13/15. At that time the patient's symptoms had resolved. She had no signs of ischemia in her feet or legs.  07/13/15 aortoiliac Duplex suggested a patent femoral-femoral bypass graft with no evidence of stenosis and greater than 50% stenosis of the left external iliac artery. Inflow to bypass artery velocity was 237 cm/sec. No significant changes noted when compared to previous exam of 12/08/2014.  ABI's were not performed. Carotid Duplex that day suggested minimal bilateral ICA stenosis.  She was to follow up in 6 with the following tests: BLE ABI, aortoiliac duplex. Carotid Duplex 1 year.  Pt Diabetic: No Pt smoker: former smoker, quit in the 1980's   Pt meds include: Statin :No, takes Red Yeast Rice Betablocker: Yes ASA: Yes Other anticoagulants/antiplatelets: no      Past Medical History  Diagnosis Date  . Hypertension   . Osteoporosis   . Pneumonia   . Anemia   . Arthritis   . Cancer     colon cancer  . Peripheral vascular disease    . Rate-related bundle branch block 07/04/2014    Social History Social History  Substance Use Topics  . Smoking status: Former Smoker    Quit date: 03/19/1979  . Smokeless tobacco: Never Used  . Alcohol Use: No    Family History Family History  Problem Relation Age of Onset  . Stroke Mother   . Heart disease Mother   . Diabetes Mother   . Hypertension Mother   . Hypertension Sister   . Cancer Brother   . Heart attack Brother   . Cancer Daughter   . Hyperlipidemia Daughter   . Cancer Son   . Cancer Daughter   . Cancer Son     Past Surgical History  Procedure Laterality Date  . Cholecystectomy    . Colon surgery      colon cancer  . Tubal ligation    . Colonoscopy  03/19/2012    Procedure: COLONOSCOPY;  Surgeon: Jerene Bears, MD;  Location: [facility];  Service: Gastroenterology;  Laterality: N/A;  . Esophagogastroduodenoscopy  03/19/2012    Procedure: ESOPHAGOGASTRODUODENOSCOPY (EGD);  Surgeon: Jerene Bears, MD;  Location: [facility];  Service: Gastroenterology;  Laterality: N/A;  . Esophagogastroduodenoscopy  03/20/2012    Procedure: ESOPHAGOGASTRODUODENOSCOPY (EGD);  Surgeon: Jerene Bears, MD;  Location: [facility];  Service: Gastroenterology;  Laterality: N/A;  . Colonoscopy  03/20/2012    Procedure: COLONOSCOPY;  Surgeon: Jerene Bears, MD;  Location: [facility];  Service: Gastroenterology;  Laterality: N/A;  . Spine surgery  06-19-14  . Endarterectomy femoral Right 07/05/2014    Procedure: ENDARTERECTOMY FEMORAL;  Surgeon: Conrad Livingston Wheeler, MD;  Location: [facility];  Service: Vascular;  Laterality: Right;  . Femoral-femoral bypass graft Bilateral 07/05/2014    Procedure: BYPASS GRAFT FEMORAL-FEMORAL ARTERY;  Surgeon: Conrad La Habra, MD;  Location: [facility];  Service: Vascular;  Laterality: Bilateral;  . Patch angioplasty Right 07/05/2014    Procedure: PATCH ANGIOPLASTY-Right Femoral Artery;  Surgeon: Conrad Moccasin, MD;  Location: [facility];  Service: Vascular;  Laterality: Right;   . Abdominal aortagram N/A 07/03/2014    Procedure: ABDOMINAL Maxcine Ham;  Surgeon: Conrad , MD;  Location: [facility] CATH [facility];  Service: Cardiovascular;  Laterality: N/A;    Allergies  Allergen Reactions  . Percocet [Oxycodone-Acetaminophen] Other (See Comments)    Severe HALLUCINATIONS    Current Outpatient Prescriptions  Medication Sig Dispense Refill  . aspirin 81 MG chewable tablet Chew 1 tablet (81 mg total) by mouth daily.    . Calcium Carbonate-Vitamin D (CALCIUM + D PO) Take 1 tablet by mouth 2 (two) times daily.    Marland Kitchen losartan-hydrochlorothiazide (HYZAAR) 100-25 MG per tablet Take 1 tablet by mouth daily.    . metoprolol succinate (TOPROL-XL) 50 MG 24 hr tablet Take 50 mg by mouth daily. Take with or immediately following a meal.    . Red Yeast Rice 600 MG TABS Take 1 tablet by mouth daily.     No current facility-administered medications for this visit.    ROS: See HPI for pertinent positives and negatives.   Physical Examination  Filed Vitals:   12/26/15 1012 12/26/15 1013  BP: 159/62 156/60  Pulse: 52   Temp: 97 F (36.1 C)   TempSrc: Oral   Height: 5\' 1"  (1.549 m)   Weight: 105 lb 14.4 oz (48.036 kg)   SpO2: 98%    Body mass index is 20.02 kg/(m^2).   General: A&O x 3, WD, thin  Pulmonary: Sym exp, good air movt, CTAB, no rales, rhonchi, & wheezing  Cardiac: RRR, Nl S1, S2, no detected murmur  Vascular: Vessel Right Left  Radial Palpable Palpable  Brachial Palpable Palpable  Carotid Palpable, with bruit Palpable, with bruit  Aorta Not palpable N/A  Femoral Palpable Palpable  Popliteal Not palpable Not palpable  PT not Palpable, audible by Doppler not Palpable, audible by Doppler  DP not Palpable, + Dopplerablesignal not Palpable, + Dopplerablesignal   Gastrointestinal: soft, NTND, -G/R, - HSM, - palpable masses, - CVAT B.  Fem-fem bypass graft is palpable at pubis.  Musculoskeletal: M/S 5/5  throughout , Extremities without ischemic changes, both groins well healed. Moderate kyphosis.  Right great toe and second toe distal phalangeal joints have moderate erythema, no swelling. Erythema in great toe extends distally to below the toenail cuticle, not tender to touch. No gangrene, no open wounds.  Neurologic: Pain and light touch intact in extremities , Motor exam as listed above               Non-Invasive Vascular Imaging: DATE: 12/26/2015 AORTO-ILIAC DUPLEX LIMITED EVALUATION  FEMORAL-FEMORAL BYPASS GRAFT    INDICATION: Right great toe pain and redness    PREVIOUS INTERVENTION(S): Left to right femorofemoral bypass graft 07/05/2014    DUPLEX EXAM:     LOCATION Velocities                (cm/s) Diameter AP                          (  cm) Diameter Transverse (cm)  Inflow Artery        406-301    Anastomosis              271    Graft         30    Anastomosis          48    Outflow Artery      56       Right  Left  Today's ABI/TBI 0.70 0.68  Previous ABI/TBI   12/08/2014   0.83 0.75    If Ankle Brachial Index (ABI) or Toe Brachial Index (TBI) performed, please see complete report   ADDITIONAL FINDINGS: The right proximal superficial femoral artery stenosis; velocity and ratio criteria suggests 30-49% stenosis. Peak systolic velocity in this segment is 148 cm/s and significantly higher than the pre-stenosis velocity of 26 cm/s.    IMPRESSION: Widely patent left to right femorofemoral bypass graft with inflow and outflow disease. Please see the following diagram for exam details.    Compared to the previous exam:  Disease progression of the native inflow and outflow arteries.      ASSESSMENT: Mary [name] is a 82 y.o. female who is s/p right iliofem EA w/ BPA, L to R fem-fem BPG on 07/05/14.  Active 82 year old female noted erythema and pain in her right great and second toes about 2 weeks ago, has not progressed, no other signs of ischemia in her feet/legs.   Today's aortoiliac fem-fem bypass duplex suggests right proximal superficial femoral artery stenosis; velocity and ratio criteria suggests 30-49% stenosis. Peak systolic velocity in this segment is 148 cm/s and significantly higher than the pre-stenosis velocity of 26 cm/s. Widely patent left to right femorofemoral bypass graft with inflow (406-301 cm/s) and outflow disease.  Inflow to bypass artery velocity was 237 cm/sec on 07/13/15, which has increased significantly on today's exam.  Bilateral ABI's have also dropped, right more so than left.  Carotid Duplex on 07/13/15 suggests minimal bilateral ICA stenosis.   Her atherosclerotic risk factors are low: she does not have DM, she quit smoking in the 1980's, she is thin, and she is active.  She has one kidney (congenital); her last creatinine result on file was 0.91 on 07/04/14.  I discussed pt HPI, physical exam results, aortoiliac duplex results, and ABI's compared to previous ABI's and aortoiliac duplex results with Dr. Bridgett Larsson.  PLAN:  Based on the patient's vascular studies and examination, pt will be scheduled for aortogram, possible intervention left iliac artery with Dr. Bridgett Larsson  on 2/27 or 01/10/16.  I discussed in depth with the patient the nature of atherosclerosis, and emphasized the importance of maximal medical management including strict control of blood pressure, blood glucose, and lipid levels, obtaining regular exercise, and continued cessation of smoking.  The patient is aware that without maximal medical management the underlying atherosclerotic disease process will progress, limiting the benefit of any interventions.  The patient was given information about PAD including signs, symptoms, treatment, what symptoms should prompt the patient to seek immediate medical care, and risk reduction measures to take.  Clemon Chambers, RN, MSN, FNP-C [facility] Phone: [phone]  Clinic MD:  Bridgett Larsson  12/26/2015 10:08 AM

## 2016-01-10 NOTE — Progress Notes (Signed)
Dr. Bridgett Larsson paged and made aware of HR going down to 35-SB to 55, SB and current BP 78/25. Order taken for IV bolus

## 2016-01-10 NOTE — Op Note (Addendum)
OPERATIVE NOTE   PROCEDURE: 1.  Left common femoral artery cannulation under ultrasound guidance 2.  Placement of catheter in aorta 3.  Aortogram 4.  Conscious sedation for 46 minutes 5.  Intra-arterial nitroglycerin administration 6.  Angioplasty and stenting of left common and external iliac artery (6 mm x 27 mm) 7.  Bilateral leg runoff via left sheath  PRE-OPERATIVE DIAGNOSIS: drop in ABI, stenosis in left external iliac artery on duplex  POST-OPERATIVE DIAGNOSIS: same as above   SURGEON: Adele Barthel, MD  ANESTHESIA: conscious sedation  ESTIMATED BLOOD LOSS: 50 cc  CONTRAST: 105 cc  FINDING(S):  Aorta: patent but heavily diseased with multiple stenoses <50%, heavily calcified  Superior mesenteric artery: not visualized Celiac artery: not visualized   Right Left  RA patent patent  CIA Patent stump, occluded otherwise Patent but diseased with multiple stenoses <50%  EIA Occluded Patent, proximal stenosis >75%: resolved with stenting  IIA Occluded Patent  CFA Patent Patent  SFA Patent, proximal stenosis 50% Patent  PFA Patent Patent  Pop Patent Patent  Trif Patent Patent  AT Occluded shortly after takeoff Patent proximally, Occludes distally  Pero Patent, main runoff to foot Patent proximally, Occludes distally  PT Occluded shortly after takeoff Patent, main runoff to foot  Patent femorofemoral bypass  SPECIMEN(S):  none  INDICATIONS:   Mary [name] is a 82 y.o. female who presents with drop in ABI and increased PSV in the inflow artery to her left to right femorofemoral bypas.  The patient presents for: aortogram, bilateral leg runoff and possible intervention.  I discussed with the patient the nature of angiographic procedures, especially the limited patencies of any endovascular intervention.  The patient is aware of that the risks of an angiographic procedure include but are not limited to: bleeding, infection, access site complications, renal failure,  embolization, rupture of vessel, dissection, possible need for emergent surgical intervention, possible need for surgical procedures to treat the patient's pathology, and stroke and death.  The patient is aware of the risks and agrees to proceed.  DESCRIPTION: After full informed consent was obtained from the patient, the patient was brought back to the angiography suite.  The patient was placed supine upon the angiography table and connected to cardiopulmonary monitoring equipment.  The patient was then given conscious sedation, the amounts of which are documented in the patient's chart.  A circulating radiologic technician maintained continuous monitoring of the patient's cardiopulmonary status.  Additionally, the control room radiologic technician provided backup monitoring throughout the procedure.  The patient was prepped and drape in the standard fashion for an angiographic procedure.  At this point, attention was turned to the left groin.  Under ultrasound guidance, the subcutaneous tissue surrounding the left common femoral artery, distal to the femorofemoral graft, was anesthesized with 1% lidocaine with epinephrine.  The artery was then cannulated with a micropuncture needle.  The microwire was advanced into the iliac arterial system.  The needle was exchanged for a microsheath, which was loaded into the common femoral artery over the wire.  The microwire was exchanged for a [facility] wire which was advanced into the aorta.  I tried to replace the sheath but the sheath would not advance due to the scar tissue.  I loaded an end-hole catheter over the wire into the aorta.  The wire was exchanged for an Amplatz wire.  The 5-Fr sheath was advanced into the left common femoral artery.   The Omniflush catheter was then loaded over the wire up  to the level of L1.  The catheter was connected to the power injector circuit.  After de-airring and de-clotting the circuit, a power injector aortogram was completed.   Based on the images, intervention on the left external iliac artery is likely needed.  I replaced the catheter and administered 200 mcg of Nitroglycerin.  After waiting a minute, I pulled back and endhole catheter through the left iliac arterial system.  There was a 20-30 mm Hg gradient past the iliac bifurcation.  I elected to treat this lesion.  The Amplatz wire was reloaded into the aorta.  I then exchanged the left common femoral artery sheath for a long 6-Fr sheath, which I advanced past the iliac bifurcation stenosis.  I did a repeat injection to confirm position of the stenosis.  Based on imaging, I selected a 6 mm x 27 mm balloon expandable stent.  I deployed this stent with slight overlap of the iliac bifurcation.  A significant waist was visualized during this processs.  I removed the balloon and did a hand injection.  The stenosis was resolved.  I then connected the left femoral sheath to the power injector circuit.  I did an automated bilateral leg runoff via the left sheath.  The findings are listed above.   COMPLICATIONS: none  CONDITION: stable  Adele Barthel, MD [facility] [place] Office: [phone] Pager: [phone]  01/10/2016, 11:09 AM

## 2016-01-10 NOTE — Progress Notes (Signed)
Site area: left fa sheath Site Prior to Removal:  Level  0 Pressure Applied For:  20 minutes Manual:   yes Patient Status During Pull: stable  Post Pull Site:  Level 0 Post Pull Instructions Given:  yes Post Pull Pulses Present: yes Dressing Applied:  tegaderm Bedrest begins @  [id number] Comments: vital signs stable

## 2016-01-10 NOTE — Discharge Instructions (Signed)
Angiogram, Care After °Refer to this sheet in the next few weeks. These instructions provide you with information about caring for yourself after your procedure. Your health care provider may also give you more specific instructions. Your treatment has been planned according to current medical practices, but problems sometimes occur. Call your health care provider if you have any problems or questions after your procedure. °WHAT TO EXPECT AFTER THE PROCEDURE °After your procedure, it is typical to have the following: °· Bruising at the catheter insertion site that usually fades within 1-2 weeks. °· Blood collecting in the tissue (hematoma) that may be painful to the touch. It should usually decrease in size and tenderness within 1-2 weeks. °HOME CARE INSTRUCTIONS °· Take medicines only as directed by your health care provider. °· You may shower 24-48 hours after the procedure or as directed by your health care provider. Remove the bandage (dressing) and gently wash the site with plain soap and water. Pat the area dry with a clean towel. Do not rub the site, because this may cause bleeding. °· Do not take baths, swim, or use a hot tub until your health care provider approves. °· Check your insertion site every day for redness, swelling, or drainage. °· Do not apply powder or lotion to the site. °· Do not lift over 10 lb (4.5 kg) for 5 days after your procedure or as directed by your health care provider. °· Ask your health care provider when it is okay to: °¨ Return to work or school. °¨ Resume usual physical activities or sports. °¨ Resume sexual activity. °· Do not drive home if you are discharged the same day as the procedure. Have someone else drive you. °· You may drive 24 hours after the procedure unless otherwise instructed by your health care provider. °· Do not operate machinery or power tools for 24 hours after the procedure or as directed by your health care provider. °· If your procedure was done as an  outpatient procedure, which means that you went home the same day as your procedure, a responsible adult should be with you for the first 24 hours after you arrive home. °· Keep all follow-up visits as directed by your health care provider. This is important. °SEEK MEDICAL CARE IF: °· You have a fever. °· You have chills. °· You have increased bleeding from the catheter insertion site. Hold pressure on the site. °SEEK IMMEDIATE MEDICAL CARE IF: °· You have unusual pain at the catheter insertion site. °· You have redness, warmth, or swelling at the catheter insertion site. °· You have drainage (other than a small amount of blood on the dressing) from the catheter insertion site. °· The catheter insertion site is bleeding, and the bleeding does not stop after 30 minutes of holding steady pressure on the site. °· The area near or just beyond the catheter insertion site becomes pale, cool, tingly, or numb. °  °This information is not intended to replace advice given to you by your health care provider. Make sure you discuss any questions you have with your health care provider. °  °Document Released: 05/15/2005 Document Revised: 11/17/2014 Document Reviewed: 03/30/2013 °Elsevier Interactive Patient Education ©2016 [employer] ° °

## 2016-01-10 NOTE — Progress Notes (Signed)
LEFT fa sheath

## 2016-01-10 NOTE — Interval H&P Note (Signed)
History and Physical Interval Note:  01/10/2016 8:29 AM  Mary [name]  has presented today for surgery, with the diagnosis of stenois of femfem bypass graft  The various methods of treatment have been discussed with the patient and family. After consideration of risks, benefits and other options for treatment, the patient has consented to  Procedure(s): Abdominal Aortogram w/Lower Extremity (N/A) as a surgical intervention .  The patient's history has been reviewed, patient examined, no change in status, stable for surgery.  I have reviewed the patient's chart and labs.  Questions were answered to the patient's satisfaction.     Adele Barthel

## 2016-01-11 ENCOUNTER — Encounter (HOSPITAL_COMMUNITY): Payer: Self-pay | Admitting: Vascular Surgery

## 2016-01-11 MED FILL — Lidocaine HCl Local Preservative Free (PF) Inj 1%: INTRAMUSCULAR | Qty: 30 | Status: AC

## 2016-01-15 ENCOUNTER — Telehealth: Payer: Self-pay | Admitting: Vascular Surgery

## 2016-01-15 NOTE — Telephone Encounter (Signed)
-----   Message from Mena Goes, RN sent at 01/10/2016 11:46 AM EST ----- Regarding: schedule   ----- Message -----    From: Conrad Mars, MD    Sent: 01/10/2016  11:28 AM      To: [facility] [address]  Mary [name] [id number] 1923/01/13   PROCEDURE: 1. Left common femoral artery cannulation under ultrasound guidance 2. Placement of catheter in aorta 3. Aortogram 4. Conscious sedation for 46 minutes 5. Intra-arterial nitroglycerin administration 6. Angioplasty and stenting of left common and external iliac artery (6 mm x 27 mm) 7. Bilateral leg runoff via left sheath  Follow-up: 4 weeks

## 2016-01-15 NOTE — Telephone Encounter (Signed)
Spoke with pt to schedule, dpm °

## 2016-01-18 ENCOUNTER — Encounter (HOSPITAL_COMMUNITY): Payer: Medicare Other

## 2016-01-18 ENCOUNTER — Ambulatory Visit: Payer: Medicare Other | Admitting: Family

## 2016-01-30 DIAGNOSIS — L110 Acquired keratosis follicularis: Secondary | ICD-10-CM | POA: Diagnosis not present

## 2016-01-30 DIAGNOSIS — B351 Tinea unguium: Secondary | ICD-10-CM | POA: Diagnosis not present

## 2016-01-30 DIAGNOSIS — I739 Peripheral vascular disease, unspecified: Secondary | ICD-10-CM | POA: Diagnosis not present

## 2016-01-31 ENCOUNTER — Encounter: Payer: Self-pay | Admitting: Vascular Surgery

## 2016-02-08 ENCOUNTER — Other Ambulatory Visit: Payer: Self-pay | Admitting: *Deleted

## 2016-02-08 ENCOUNTER — Encounter: Payer: Self-pay | Admitting: Vascular Surgery

## 2016-02-08 ENCOUNTER — Ambulatory Visit (INDEPENDENT_AMBULATORY_CARE_PROVIDER_SITE_OTHER): Payer: Medicare Other | Admitting: Vascular Surgery

## 2016-02-08 VITALS — BP 151/62 | HR 53 | Ht 61.0 in | Wt 102.3 lb

## 2016-02-08 DIAGNOSIS — I70263 Atherosclerosis of native arteries of extremities with gangrene, bilateral legs: Secondary | ICD-10-CM | POA: Diagnosis not present

## 2016-02-08 DIAGNOSIS — I779 Disorder of arteries and arterioles, unspecified: Secondary | ICD-10-CM

## 2016-02-08 DIAGNOSIS — I739 Peripheral vascular disease, unspecified: Secondary | ICD-10-CM

## 2016-02-08 NOTE — Progress Notes (Signed)
Established Critical Limb Ischemia Patient  History of Present Illness  Mary [name] is a 82 y.o. (1923-09-06) female who presents with chief complaint: aching in bottom of R foot.   Prior procedures include:  1.  L to R fem-fem BPG, R iliofemoral EA w/ BPA (07/05/14) 2.  PTA+S L CIA and EIA (01/10/16)  The patient has no rest pain and no wounds.  The patient notes decreased motion in mid-foot R>L with aching in plantar surface of R foot.  The patient's treatment regimen currently included: maximal medical management and podiatric mgmt.   The patient's PMH, PSH, SH, and FamHx are unchanged from 01/10/16  Current Outpatient Prescriptions  Medication Sig Dispense Refill  . amLODipine (NORVASC) 2.5 MG tablet Take 2.5 mg by mouth daily.    Marland Kitchen aspirin 81 MG chewable tablet Chew 1 tablet (81 mg total) by mouth daily.    . Calcium Carbonate-Vitamin D (CALCIUM + D PO) Take 1 tablet by mouth 2 (two) times daily.    . cephALEXin (KEFLEX) 500 MG capsule Take 1 capsule (500 mg total) by mouth 3 (three) times daily. 30 capsule 0  . losartan-hydrochlorothiazide (HYZAAR) 100-25 MG per tablet Take 1 tablet by mouth daily.    . metoprolol succinate (TOPROL-XL) 50 MG 24 hr tablet Take 50 mg by mouth daily. Take with or immediately following a meal.    . Multiple Vitamins-Minerals (MULTIVITAMIN WITH MINERALS) tablet Take 1 tablet by mouth daily.    . Red Yeast Rice 600 MG TABS Take 1 tablet by mouth daily.     No current facility-administered medications for this visit.    Allergies  Allergen Reactions  . Percocet [Oxycodone-Acetaminophen] Other (See Comments)    Severe HALLUCINATIONS    On ROS today: no rest pain, no intermittent claudication    Physical Examination  Filed Vitals:   02/08/16 1049 02/08/16 1052  BP: 160/61 151/62  Pulse: 53   Height: 5\' 1"  (1.549 m)   Weight: 102 lb 4.8 oz (46.403 kg)   SpO2: 96%    Body mass index is 19.34 kg/(m^2).  General: A&O x 3, WDWN,  elderly, cachectic  Eyes: PERRLA, EOMI  Pulmonary: Sym exp, good air movt, CTAB, no rales, rhonchi, & wheezing  Cardiac: RRR, Nl S1, S2, no Murmurs, rubs or gallops  Vascular: Vessel Right Left  Radial Palpable Palpable  Brachial Palpable Palpable  Carotid Palpable, without bruit Palpable, without bruit  Aorta Not palpable N/A  Femoral Palpable Palpable  Popliteal Not palpable Not palpable  PT Faintly Palpable Faintly Palpable  DP Faintly Palpable Faintly Palpable   Gastrointestinal: soft, NTND, no G/R, no HSM, no masses, no CVAT B  Musculoskeletal: M/S 5/5 throughout , Extremities without ischemic changes   Neurologic: Pain and light touch intact in extremities , Motor exam as listed above   Medical Decision Making  Mary [name] is a 82 y.o. female who presents with: RLE critical limb ischemia, s/p R iliofem EA w/ BPA, L-to-R fem-fem BPG, L CIA & EIA PTA+S   Based on the patient's vascular studies and examination, I have offered the patient: q3 month ABI and Aortoiliac duplex.  I discussed in depth with the patient the nature of atherosclerosis, and emphasized the importance of maximal medical management including strict control of blood pressure, blood glucose, and lipid levels, antiplatelet agents, obtaining regular exercise, and cessation of smoking.    The patient is aware that without maximal medical management the underlying atherosclerotic disease process will progress,  limiting the benefit of any interventions. The patient is currently on a statin: not medically indicated. The patient is currently on an anti-platelet: ASA.  Thank you for allowing [place] to participate in this patient's care.   Adele Barthel, MD [facility] [place] Office: [phone] Pager: [phone]  02/08/2016, 9:31 AM

## 2016-02-10 DIAGNOSIS — I739 Peripheral vascular disease, unspecified: Secondary | ICD-10-CM | POA: Diagnosis not present

## 2016-02-12 DIAGNOSIS — I10 Essential (primary) hypertension: Secondary | ICD-10-CM | POA: Diagnosis not present

## 2016-02-12 DIAGNOSIS — D649 Anemia, unspecified: Secondary | ICD-10-CM | POA: Diagnosis not present

## 2016-02-12 DIAGNOSIS — N183 Chronic kidney disease, stage 3 (moderate): Secondary | ICD-10-CM | POA: Diagnosis not present

## 2016-02-12 DIAGNOSIS — E782 Mixed hyperlipidemia: Secondary | ICD-10-CM | POA: Diagnosis not present

## 2016-02-19 DIAGNOSIS — I10 Essential (primary) hypertension: Secondary | ICD-10-CM | POA: Diagnosis not present

## 2016-02-19 DIAGNOSIS — I739 Peripheral vascular disease, unspecified: Secondary | ICD-10-CM | POA: Diagnosis not present

## 2016-02-19 DIAGNOSIS — E782 Mixed hyperlipidemia: Secondary | ICD-10-CM | POA: Diagnosis not present

## 2016-02-19 DIAGNOSIS — N183 Chronic kidney disease, stage 3 (moderate): Secondary | ICD-10-CM | POA: Diagnosis not present

## 2016-02-19 DIAGNOSIS — Z0001 Encounter for general adult medical examination with abnormal findings: Secondary | ICD-10-CM | POA: Diagnosis not present

## 2016-02-19 DIAGNOSIS — G6289 Other specified polyneuropathies: Secondary | ICD-10-CM | POA: Diagnosis not present

## 2016-04-10 DIAGNOSIS — I739 Peripheral vascular disease, unspecified: Secondary | ICD-10-CM | POA: Diagnosis not present

## 2016-04-10 DIAGNOSIS — B351 Tinea unguium: Secondary | ICD-10-CM | POA: Diagnosis not present

## 2016-04-10 DIAGNOSIS — L110 Acquired keratosis follicularis: Secondary | ICD-10-CM | POA: Diagnosis not present

## 2016-04-14 ENCOUNTER — Encounter: Payer: Self-pay | Admitting: Cardiology

## 2016-05-20 ENCOUNTER — Encounter: Payer: Self-pay | Admitting: Vascular Surgery

## 2016-05-23 ENCOUNTER — Ambulatory Visit (INDEPENDENT_AMBULATORY_CARE_PROVIDER_SITE_OTHER): Payer: Medicare Other | Admitting: Vascular Surgery

## 2016-05-23 ENCOUNTER — Encounter: Payer: Self-pay | Admitting: Vascular Surgery

## 2016-05-23 ENCOUNTER — Ambulatory Visit (HOSPITAL_COMMUNITY)
Admission: RE | Admit: 2016-05-23 | Discharge: 2016-05-23 | Disposition: A | Discharge status: Home / Self Care | Payer: Medicare Other | Source: Ambulatory Visit | Attending: Vascular Surgery | Admitting: Vascular Surgery

## 2016-05-23 ENCOUNTER — Ambulatory Visit (INDEPENDENT_AMBULATORY_CARE_PROVIDER_SITE_OTHER)
Admission: RE | Admit: 2016-05-23 | Discharge: 2016-05-23 | Disposition: A | Discharge status: Home / Self Care | Payer: Medicare Other | Source: Ambulatory Visit | Attending: Vascular Surgery | Admitting: Vascular Surgery

## 2016-05-23 VITALS — BP 192/70 | HR 49 | Temp 97.1°F | Resp 14 | Ht 62.0 in | Wt 105.0 lb

## 2016-05-23 DIAGNOSIS — I739 Peripheral vascular disease, unspecified: Secondary | ICD-10-CM

## 2016-05-23 DIAGNOSIS — Z87891 Personal history of nicotine dependence: Secondary | ICD-10-CM | POA: Diagnosis not present

## 2016-05-23 DIAGNOSIS — Z95820 Peripheral vascular angioplasty status with implants and grafts: Secondary | ICD-10-CM | POA: Diagnosis not present

## 2016-05-23 DIAGNOSIS — I779 Disorder of arteries and arterioles, unspecified: Secondary | ICD-10-CM | POA: Diagnosis not present

## 2016-05-23 DIAGNOSIS — I998 Other disorder of circulatory system: Secondary | ICD-10-CM

## 2016-05-23 DIAGNOSIS — I70229 Atherosclerosis of native arteries of extremities with rest pain, unspecified extremity: Secondary | ICD-10-CM

## 2016-05-23 DIAGNOSIS — E785 Hyperlipidemia, unspecified: Secondary | ICD-10-CM | POA: Insufficient documentation

## 2016-05-23 DIAGNOSIS — Z95828 Presence of other vascular implants and grafts: Secondary | ICD-10-CM | POA: Diagnosis not present

## 2016-05-23 DIAGNOSIS — I10 Essential (primary) hypertension: Secondary | ICD-10-CM | POA: Insufficient documentation

## 2016-05-23 NOTE — Progress Notes (Signed)
Established Previous Bypass   History of Present Illness  Mary [name] is a 82 y.o. (August 07, 1923) female who presents with chief complaint: continued aching in R foot.  Previous operation(s) include:   1. L to R fem-fem BPG, R iliofemoral EA w/ BPA (07/05/14) 2. PTA+S L CIA and EIA (01/10/16)  The patient's symptoms have not progressed.  The patient's symptoms are: neuropathic character pain in feet occasionally with milder sx in hands.  The patient's treatment regimen currently included: maximal medical management.  The patient's PMH, PSH, SH, and FamHx are unchanged from 02/08/16.   Current Outpatient Prescriptions  Medication Sig Dispense Refill  . amLODipine (NORVASC) 2.5 MG tablet Take 2.5 mg by mouth daily.    Marland Kitchen aspirin 81 MG chewable tablet Chew 1 tablet (81 mg total) by mouth daily.    . Calcium Carbonate-Vitamin D (CALCIUM + D PO) Take 1 tablet by mouth 2 (two) times daily.    Marland Kitchen losartan-hydrochlorothiazide (HYZAAR) 100-25 MG per tablet Take 1 tablet by mouth daily.    . metoprolol succinate (TOPROL-XL) 50 MG 24 hr tablet Take 50 mg by mouth daily. Take with or immediately following a meal.    . Multiple Vitamins-Minerals (MULTIVITAMIN WITH MINERALS) tablet Take 1 tablet by mouth daily.    . Red Yeast Rice 600 MG TABS Take 1 tablet by mouth daily.    Marland Kitchen alendronate (FOSAMAX) 70 MG tablet     . cephALEXin (KEFLEX) 500 MG capsule Take 1 capsule (500 mg total) by mouth 3 (three) times daily. (Patient not taking: Reported on 05/23/2016) 30 capsule 0   No current facility-administered medications for this visit.    Allergies  Allergen Reactions  . Percocet [Oxycodone-Acetaminophen] Other (See Comments)    Severe HALLUCINATIONS    On ROS today: no rest pain, no foot wounds   Physical Examination  Filed Vitals:   05/23/16 1143 05/23/16 1146  BP: 182/65 192/70  Pulse: 49 49  Temp: 97.1 F (36.2 C)   Resp: 14   Height: 5\' 2"  (1.575 m)   Weight: 105 lb (47.628  kg)   SpO2: 97%    Body mass index is 19.2 kg/(m^2).  General: A&O x 3, WD, thin  Pulmonary: Sym exp, good air movt, CTAB, no rales, rhonchi, & wheezing  Cardiac: RRR, Nl S1, S2, no Murmurs, rubs or gallops  Vascular: Vessel Right Left  Radial Palpable Palpable  Brachial Palpable Palpable  Carotid Palpable, without bruit Palpable, without bruit  Aorta Not palpable N/A  Femoral Palpable Palpable  Popliteal Not palpable Not palpable  PT Palpable Palpable  DP Not Palpable Not Palpable   Gastrointestinal: soft, NTND, no G/R, no HSM, no masses, no CVAT B  Musculoskeletal: M/S 5/5 throughout , Extremities without ischemic changes , incisions are healed, palpable fem-fem graft  Neurologic: Pain and light touch intact in extremities except mild decrease in feet, Motor exam as listed above   Non-Invasive Vascular Imaging ABI (Date: 05/23/2016)  R:   ABI: 0.82 (0.70),   DP: mono,   PT: bi,   TBI: 0.45 (0.36)  L:   ABI: 0.71 (0.68),   DP: mono,   PT: bi,   TBI: 0.70 (0.38)  Aortoiliac Duplex (Date: 05/23/2016)  Ao: 164 c/s  R CIA proximal to stent: 269 c/s  R CIA/EIA stent: 242-311 c/s  R EIA Distal to stent: 450 c/s  Patent fem-fem graft   Medical Decision Making  Mary [name] is a 82 y.o. female who presents  with: RLE critical limb ischemia, s/p R iliofem EA w/ BPA, L-to-R fem-fem BPG, L CIA & EIA PTA+S   The ABI appears to be somewhat improved especially in the L TBI.  The L EIA PSV is elevated as previously, but nothing significant was found on angiography.  Based on the patient's vascular studies and examination, I have offered the patient: BLE ABI and aortoiliac duplex in 6 months.  I discussed in depth with the patient the nature of atherosclerosis, and emphasized the importance of maximal medical management including strict control of blood pressure, blood glucose, and lipid levels, obtaining regular exercise, and cessation of smoking.     The patient is aware that without maximal medical management the underlying atherosclerotic disease process will progress, limiting the benefit of any interventions.  I discussed in depth with the patient the need for long term surveillance to improve the primary assisted patency of his bypass.  The patient agrees to cooperate with such. The patient is currently not on a statin: at patient's discretion.  She is taking red yeast rice instead.  The patient is currently on an anti-platelet: ASA.  Thank you for allowing [place] to participate in this patient's care.   Adele Barthel, MD, FACS [facility] Office: [phone] Pager: [phone]

## 2016-06-17 DIAGNOSIS — D649 Anemia, unspecified: Secondary | ICD-10-CM | POA: Diagnosis not present

## 2016-06-17 DIAGNOSIS — N183 Chronic kidney disease, stage 3 (moderate): Secondary | ICD-10-CM | POA: Diagnosis not present

## 2016-06-17 DIAGNOSIS — E782 Mixed hyperlipidemia: Secondary | ICD-10-CM | POA: Diagnosis not present

## 2016-06-17 DIAGNOSIS — I10 Essential (primary) hypertension: Secondary | ICD-10-CM | POA: Diagnosis not present

## 2016-06-17 DIAGNOSIS — Q600 Renal agenesis, unilateral: Secondary | ICD-10-CM | POA: Diagnosis not present

## 2016-06-19 DIAGNOSIS — I739 Peripheral vascular disease, unspecified: Secondary | ICD-10-CM | POA: Diagnosis not present

## 2016-06-19 DIAGNOSIS — L110 Acquired keratosis follicularis: Secondary | ICD-10-CM | POA: Diagnosis not present

## 2016-06-19 DIAGNOSIS — B351 Tinea unguium: Secondary | ICD-10-CM | POA: Diagnosis not present

## 2016-06-20 DIAGNOSIS — I10 Essential (primary) hypertension: Secondary | ICD-10-CM | POA: Diagnosis not present

## 2016-06-20 DIAGNOSIS — Z6820 Body mass index (BMI) 20.0-20.9, adult: Secondary | ICD-10-CM | POA: Diagnosis not present

## 2016-06-20 DIAGNOSIS — E782 Mixed hyperlipidemia: Secondary | ICD-10-CM | POA: Diagnosis not present

## 2016-06-20 DIAGNOSIS — G6289 Other specified polyneuropathies: Secondary | ICD-10-CM | POA: Diagnosis not present

## 2016-06-20 DIAGNOSIS — I739 Peripheral vascular disease, unspecified: Secondary | ICD-10-CM | POA: Diagnosis not present

## 2016-06-20 DIAGNOSIS — N183 Chronic kidney disease, stage 3 (moderate): Secondary | ICD-10-CM | POA: Diagnosis not present

## 2016-07-07 DIAGNOSIS — Z23 Encounter for immunization: Secondary | ICD-10-CM | POA: Diagnosis not present

## 2016-07-08 ENCOUNTER — Other Ambulatory Visit: Payer: Self-pay

## 2016-07-11 DIAGNOSIS — H02054 Trichiasis without entropian left upper eyelid: Secondary | ICD-10-CM | POA: Diagnosis not present

## 2016-07-11 DIAGNOSIS — H43822 Vitreomacular adhesion, left eye: Secondary | ICD-10-CM | POA: Diagnosis not present

## 2016-08-22 ENCOUNTER — Emergency Department (HOSPITAL_COMMUNITY)
Admission: EM | Admit: 2016-08-22 | Discharge: 2016-08-22 | Disposition: A | Discharge status: Home / Self Care | Payer: Medicare Other | Attending: Emergency Medicine | Admitting: Emergency Medicine

## 2016-08-22 ENCOUNTER — Emergency Department (HOSPITAL_COMMUNITY): Payer: Medicare Other

## 2016-08-22 ENCOUNTER — Encounter (HOSPITAL_COMMUNITY): Payer: Self-pay | Admitting: *Deleted

## 2016-08-22 DIAGNOSIS — Z85038 Personal history of other malignant neoplasm of large intestine: Secondary | ICD-10-CM | POA: Insufficient documentation

## 2016-08-22 DIAGNOSIS — Z87891 Personal history of nicotine dependence: Secondary | ICD-10-CM | POA: Insufficient documentation

## 2016-08-22 DIAGNOSIS — M79674 Pain in right toe(s): Secondary | ICD-10-CM | POA: Insufficient documentation

## 2016-08-22 DIAGNOSIS — Z7982 Long term (current) use of aspirin: Secondary | ICD-10-CM | POA: Diagnosis not present

## 2016-08-22 DIAGNOSIS — Z79899 Other long term (current) drug therapy: Secondary | ICD-10-CM | POA: Insufficient documentation

## 2016-08-22 DIAGNOSIS — I10 Essential (primary) hypertension: Secondary | ICD-10-CM | POA: Insufficient documentation

## 2016-08-22 DIAGNOSIS — L539 Erythematous condition, unspecified: Secondary | ICD-10-CM | POA: Diagnosis not present

## 2016-08-22 MED ORDER — DOXYCYCLINE HYCLATE 100 MG PO CAPS: 100.0000 mg | ORAL_CAPSULE | Freq: Two times a day (BID) | ORAL | 0 refills | Status: DC

## 2016-08-22 NOTE — ED Triage Notes (Signed)
Pt comes in with right foot great toe redness starting in the last 3 days. Pt denies any injury. States she has hx of not being able to get blood supply to that foot. Denies any pain at this time.

## 2016-08-22 NOTE — ED Notes (Signed)
Patient transported to X-ray 

## 2016-08-22 NOTE — ED Notes (Signed)
Pt returned from xray

## 2016-08-22 NOTE — Discharge Instructions (Signed)
Take antibiotic as directed for the next 7 days. Recommend soaking your right foot in warm water for 20 minutes each day. Return for any new or worse symptoms. Make an appointment to follow-up with your regular Dr. for recheck of the toe.

## 2016-08-22 NOTE — ED Provider Notes (Signed)
[facility] DEPT Provider Note   CSN: [id number] Arrival date & time: 08/22/16  1002  By signing my name below, I, Jeanell Sparrow, attest that this documentation has been prepared under the direction and in the presence of Fredia Sorrow, MD . Electronically Signed: Jeanell Sparrow, Scribe. 08/22/2016. 11:49 AM.  History   Chief Complaint Chief Complaint  Patient presents with  . Toe Pain   The history is provided by the patient. No language interpreter was used.  Toe Pain  This is a new problem. The current episode started more than 2 days ago. The problem occurs constantly. The problem has not changed since onset.Pertinent negatives include no chest pain, no abdominal pain, no headaches and no shortness of breath. Nothing aggravates the symptoms. Nothing relieves the symptoms. She has tried nothing for the symptoms.   HPI Comments: Mary [name] is a 82 y.o. female who presents to the Emergency Department complaining of constant moderate right big toe pain that started 3 days ago. She reports no modifying factors. She reports associated right big toe redness. She states she has a hx of toe pain that was resolved by an antibiotic. She denies any hx of gout or recent trauma,     PCP: Gar Ponto, MD  Past Medical History:  Diagnosis Date  . Anemia   . Arthritis   . Cancer [facility])    colon cancer  . Hypertension   . Osteoporosis   . Peripheral vascular disease ([place])   . Pneumonia   . Rate-related bundle branch block 07/04/2014    Patient Active Problem List   Diagnosis Date Noted  . Critical lower limb ischemia 09/08/2014  . Hypokalemia 07/04/2014  . Hyposmolality and/or hyponatremia 07/04/2014  . Former smoker 07/04/2014  . Rate-related bundle branch block 07/04/2014  . Pain 07/03/2014  . Atherosclerosis of native arteries of the extremities with gangrene ([place]) 06/30/2014  . Acute posthemorrhagic anemia 03/22/2012  . Post-polypectomy bleeding 03/20/2012  . Gastric  ulcer, acute 03/19/2012  . Colon polyps 03/19/2012  . Colon ulcer 03/19/2012  . GI bleed 03/18/2012  . Anemia 03/18/2012  . Pneumonia 03/18/2012  . HTN (hypertension) 03/18/2012  . H/O colon cancer, stage I 03/18/2012  . Near syncope 03/18/2012    Past Surgical History:  Procedure Laterality Date  . ABDOMINAL AORTAGRAM N/A 07/03/2014   Procedure: ABDOMINAL Maxcine Ham;  Surgeon: Conrad McEwen, MD;  Location: [facility] CATH LAB;  Service: Cardiovascular;  Laterality: N/A;  . CHOLECYSTECTOMY    . COLON SURGERY     colon cancer  . COLONOSCOPY  03/19/2012   Procedure: COLONOSCOPY;  Surgeon: Jerene Bears, MD;  Location: [facility];  Service: Gastroenterology;  Laterality: N/A;  . COLONOSCOPY  03/20/2012   Procedure: COLONOSCOPY;  Surgeon: Jerene Bears, MD;  Location: [place];  Service: Gastroenterology;  Laterality: N/A;  . ENDARTERECTOMY FEMORAL Right 07/05/2014   Procedure: ENDARTERECTOMY FEMORAL;  Surgeon: Conrad Waves, MD;  Location: [place];  Service: Vascular;  Laterality: Right;  . ESOPHAGOGASTRODUODENOSCOPY  03/19/2012   Procedure: ESOPHAGOGASTRODUODENOSCOPY (EGD);  Surgeon: Jerene Bears, MD;  Location: [place];  Service: Gastroenterology;  Laterality: N/A;  . ESOPHAGOGASTRODUODENOSCOPY  03/20/2012   Procedure: ESOPHAGOGASTRODUODENOSCOPY (EGD);  Surgeon: Jerene Bears, MD;  Location: [place];  Service: Gastroenterology;  Laterality: N/A;  . FEMORAL-FEMORAL BYPASS GRAFT Bilateral 07/05/2014   Procedure: BYPASS GRAFT FEMORAL-FEMORAL ARTERY;  Surgeon: Conrad , MD;  Location: [place];  Service: Vascular;  Laterality: Bilateral;  . LOWER EXTREMITY ANGIOGRAM Bilateral  01/10/2016   Procedure: Lower Extremity Angiogram;  Surgeon: Conrad San Antonio, MD;  Location: [place] CV LAB;  Service: Cardiovascular;  Laterality: Bilateral;  . PATCH ANGIOPLASTY Right 07/05/2014   Procedure: PATCH ANGIOPLASTY-Right Femoral Artery;  Surgeon: Conrad Wilbur, MD;  Location: [place];  Service: Vascular;   Laterality: Right;  . PERIPHERAL VASCULAR CATHETERIZATION Left 01/10/2016   Procedure: Peripheral Vascular Intervention;  Surgeon: Conrad Weldon, MD;  Location: [place] CV LAB;  Service: Cardiovascular;  Laterality: Left;  ext iliac  . PERIPHERAL VASCULAR CATHETERIZATION N/A 01/10/2016   Procedure: Abdominal Aortogram;  Surgeon: Conrad Ballard, MD;  Location: [place] CV LAB;  Service: Cardiovascular;  Laterality: N/A;  . SPINE SURGERY  06-19-14  . TUBAL LIGATION      OB History    No data available       Home Medications    Prior to Admission medications   Medication Sig Start Date End Date Taking? Authorizing Provider  alendronate (FOSAMAX) 70 MG tablet 70 mg once a week.  05/05/16  Yes Historical Provider, MD  amLODipine (NORVASC) 2.5 MG tablet Take 2.5 mg by mouth daily.   Yes Historical Provider, MD  aspirin 81 MG chewable tablet Chew 1 tablet (81 mg total) by mouth daily. 07/10/14  Yes Alvia Grove, PA-C  losartan-hydrochlorothiazide (HYZAAR) 100-25 MG per tablet Take 1 tablet by mouth daily.   Yes Historical Provider, MD  metoprolol succinate (TOPROL-XL) 50 MG 24 hr tablet Take 50 mg by mouth daily. Take with or immediately following a meal.   Yes Historical Provider, MD  Multiple Vitamins-Minerals (MULTIVITAMIN WITH MINERALS) tablet Take 1 tablet by mouth daily.   Yes Historical Provider, MD  polyvinyl alcohol (LIQUIFILM TEARS) 1.4 % ophthalmic solution 1 drop as needed for dry eyes.   Yes Historical Provider, MD  Red Yeast Rice 600 MG TABS Take 2 tablets by mouth daily.    Yes Historical Provider, MD  doxycycline (VIBRAMYCIN) 100 MG capsule Take 1 capsule (100 mg total) by mouth 2 (two) times daily. 08/22/16   Fredia Sorrow, MD    Family History Family History  Problem Relation Age of Onset  . Stroke Mother   . Heart disease Mother   . Diabetes Mother   . Hypertension Mother   . Hypertension Sister   . Cancer Brother   . Heart attack Brother   . Cancer Daughter     . Hyperlipidemia Daughter   . Cancer Son   . Cancer Daughter   . Cancer Son     Social History Social History  Substance Use Topics  . Smoking status: Former Smoker    Quit date: 03/19/1979  . Smokeless tobacco: Never Used  . Alcohol use No     Allergies   Percocet [oxycodone-acetaminophen]   Review of Systems Review of Systems  Constitutional: Negative for chills and fever.  HENT: Negative for congestion, rhinorrhea and sore throat.   Eyes: Negative for visual disturbance.  Respiratory: Negative for cough and shortness of breath.   Cardiovascular: Negative for chest pain and leg swelling.  Gastrointestinal: Negative for abdominal pain, diarrhea, nausea and vomiting.  Genitourinary: Negative for dysuria and hematuria.  Musculoskeletal: Positive for myalgias (Right big toe). Negative for back pain.  Skin: Positive for color change. Negative for rash.  Neurological: Negative for headaches.  Hematological: Does not bruise/bleed easily.  Psychiatric/Behavioral: Negative for confusion.     Physical Exam Updated Vital Signs BP 157/65 (BP Location: Left Arm)   Pulse  61   Temp 97.4 F (36.3 C) (Oral)   Resp 16   Ht 5\' 1"  (1.549 m)   Wt 104 lb (47.2 kg)   SpO2 96%   BMI 19.65 kg/m   Physical Exam  Constitutional: She appears well-developed and well-nourished. No distress.  HENT:  Head: Normocephalic and atraumatic.  Eyes: Conjunctivae and EOM are normal. Pupils are equal, round, and reactive to light. No scleral icterus.  Neck: Neck supple.  Cardiovascular: Normal rate and regular rhythm.   Pulmonary/Chest: Effort normal. No respiratory distress. She has no wheezes. She has no rales.  Abdominal: Soft. Bowel sounds are normal. There is no tenderness.  Musculoskeletal: Normal range of motion. She exhibits no edema.  Neurological: She is alert.  Skin: Skin is warm and dry. Capillary refill takes less than 2 seconds.  Psychiatric: She has a normal mood and affect.   Nursing note and vitals reviewed.    ED Treatments / Results  DIAGNOSTIC STUDIES: Oxygen Saturation is 96% on RA, normal by my interpretation.    COORDINATION OF CARE: 11:53 AM- Pt advised of plan for treatment and pt agrees.  Labs (all labs ordered are listed, but only abnormal results are displayed) Labs Reviewed - No data to display  EKG  EKG Interpretation None       Radiology Dg Foot Complete Right  Result Date: 08/22/2016 CLINICAL DATA:  Redness medial foot/first toe region EXAM: RIGHT FOOT COMPLETE - 3+ VIEW COMPARISON:  None. FINDINGS: Frontal, oblique, and lateral views were obtained. There is evidence of old trauma with remodeling involving the distal fourth metatarsal. There is no acute fracture or dislocation. There is flexion of the second, third, fourth, and fifth MTP, PIP, and DIP joints. There is narrowing of all PIP and DIP joints as well as narrowing of the first, fourth, and fifth MTP joints. There is bony overgrowth along the medial distal first metatarsal with bunion formation medially at the first MTP joint. There is no erosive change or bony destruction. No soft tissue calcification or abscess seen. IMPRESSION: No erosive change or bony destruction. No acute fracture or dislocation. Evidence of old trauma involving the distal fourth metatarsal with remodeling. Multilevel arthropathy noted in distal joints. Bunion formation first MTP joint region medially. No soft tissue abscess noted. Electronically Signed   By: Lowella Grip III M.D.   On: 08/22/2016 12:31    Procedures Procedures (including critical care time)  Medications Ordered in ED Medications - No data to display   Initial Impression / Assessment and Plan / ED Course  I have reviewed the triage vital signs and the nursing notes.  Pertinent labs & imaging results that were available during my care of the patient were reviewed by me and considered in my medical decision making (see chart for  details).  Clinical Course   Redness to distal right great toe. Good cap refill less than 2 seconds. X-rays negative for any evidence of any bone inflammation did not feel that this is gout. Fill it this is a localized cellulitis. We'll have patient soak foot 20 minutes in warm water each day and we'll start her on doxycycline have her follow-up with her regular doctor.   Final Clinical Impressions(s) / ED Diagnoses   Final diagnoses:  Pain of right great toe    New Prescriptions New Prescriptions   DOXYCYCLINE (VIBRAMYCIN) 100 MG CAPSULE    Take 1 capsule (100 mg total) by mouth 2 (two) times daily.   I personally performed the  services described in this documentation, which was scribed in my presence. The recorded information has been reviewed and is accurate.       Fredia Sorrow, MD 08/22/16 1254

## 2016-09-04 DIAGNOSIS — B351 Tinea unguium: Secondary | ICD-10-CM | POA: Diagnosis not present

## 2016-09-04 DIAGNOSIS — L110 Acquired keratosis follicularis: Secondary | ICD-10-CM | POA: Diagnosis not present

## 2016-09-04 DIAGNOSIS — I739 Peripheral vascular disease, unspecified: Secondary | ICD-10-CM | POA: Diagnosis not present

## 2016-09-25 ENCOUNTER — Other Ambulatory Visit: Payer: Self-pay | Admitting: *Deleted

## 2016-09-25 DIAGNOSIS — M79605 Pain in left leg: Secondary | ICD-10-CM

## 2016-09-25 DIAGNOSIS — I999 Unspecified disorder of circulatory system: Secondary | ICD-10-CM

## 2016-09-25 DIAGNOSIS — I70219 Atherosclerosis of native arteries of extremities with intermittent claudication, unspecified extremity: Secondary | ICD-10-CM

## 2016-11-18 DIAGNOSIS — N183 Chronic kidney disease, stage 3 (moderate): Secondary | ICD-10-CM | POA: Diagnosis not present

## 2016-11-18 DIAGNOSIS — E782 Mixed hyperlipidemia: Secondary | ICD-10-CM | POA: Diagnosis not present

## 2016-11-18 DIAGNOSIS — I10 Essential (primary) hypertension: Secondary | ICD-10-CM | POA: Diagnosis not present

## 2016-11-19 DIAGNOSIS — I739 Peripheral vascular disease, unspecified: Secondary | ICD-10-CM | POA: Diagnosis not present

## 2016-11-19 DIAGNOSIS — L110 Acquired keratosis follicularis: Secondary | ICD-10-CM | POA: Diagnosis not present

## 2016-11-19 DIAGNOSIS — B351 Tinea unguium: Secondary | ICD-10-CM | POA: Diagnosis not present

## 2016-11-20 DIAGNOSIS — Z6820 Body mass index (BMI) 20.0-20.9, adult: Secondary | ICD-10-CM | POA: Diagnosis not present

## 2016-11-20 DIAGNOSIS — E782 Mixed hyperlipidemia: Secondary | ICD-10-CM | POA: Diagnosis not present

## 2016-11-20 DIAGNOSIS — G6289 Other specified polyneuropathies: Secondary | ICD-10-CM | POA: Diagnosis not present

## 2016-11-20 DIAGNOSIS — I10 Essential (primary) hypertension: Secondary | ICD-10-CM | POA: Diagnosis not present

## 2016-11-20 DIAGNOSIS — I739 Peripheral vascular disease, unspecified: Secondary | ICD-10-CM | POA: Diagnosis not present

## 2016-11-20 DIAGNOSIS — N183 Chronic kidney disease, stage 3 (moderate): Secondary | ICD-10-CM | POA: Diagnosis not present

## 2016-11-28 ENCOUNTER — Encounter (HOSPITAL_COMMUNITY): Payer: Medicare Other

## 2016-11-28 ENCOUNTER — Ambulatory Visit: Payer: Medicare Other | Admitting: Vascular Surgery

## 2017-01-09 ENCOUNTER — Encounter: Payer: Self-pay | Admitting: Vascular Surgery

## 2017-01-12 NOTE — Progress Notes (Signed)
Established Previous Bypass   History of Present Illness  Mary [name] is a 83 y.o. ([birth year]) female who presents with chief complaint: joint pain better with movement.  Previous operation(s) include:   1. L to R fem-fem BPG, R iliofemoral EA w/ BPA (07/05/14) 2. PTA+S L CIA and EIA (01/10/16)  The patient's symptoms have not progressed.  The patient's symptoms are: mechanical joint pain with neuropathic character pain in feet occasionally with milder sx in hands.  The patient's treatment regimen currently included: maximal medical management.  The patient's PMH, PSH, SH, and FamHx are unchanged from 05/23/16.   Current Outpatient Prescriptions  Medication Sig Dispense Refill  . alendronate (FOSAMAX) 70 MG tablet 70 mg once a week.     Marland Kitchen amLODipine (NORVASC) 2.5 MG tablet Take 2.5 mg by mouth daily.    Marland Kitchen aspirin 81 MG chewable tablet Chew 1 tablet (81 mg total) by mouth daily.    Marland Kitchen doxycycline (VIBRAMYCIN) 100 MG capsule Take 1 capsule (100 mg total) by mouth 2 (two) times daily. 14 capsule 0  . losartan-hydrochlorothiazide (HYZAAR) 100-25 MG per tablet Take 1 tablet by mouth daily.    . metoprolol succinate (TOPROL-XL) 50 MG 24 hr tablet Take 50 mg by mouth daily. Take with or immediately following a meal.    . Multiple Vitamins-Minerals (MULTIVITAMIN WITH MINERALS) tablet Take 1 tablet by mouth daily.    . polyvinyl alcohol (LIQUIFILM TEARS) 1.4 % ophthalmic solution 1 drop as needed for dry eyes.    . Red Yeast Rice 600 MG TABS Take 2 tablets by mouth daily.      No current facility-administered medications for this visit.     On ROS today: no rest pain, no wounds   Physical Examination  Vitals:   01/16/17 1046  BP: (!) 177/71  Pulse: (!) 52  Resp: 16  Temp: 98.6 F (37 C)  TempSrc: Oral  SpO2: 98%  Weight: 105 lb (47.6 kg)  Height: 5\' 1"  (1.549 m)    Body mass index is 19.84 kg/m.  General: A&O x 3, WD, thin  Pulmonary: Sym exp, good  air movt, CTAB, no rales, rhonchi, & wheezing  Cardiac: RRR, Nl S1, S2, no Murmurs, rubs or gallops  Vascular: Vessel Right Left  Radial Palpable Palpable  Brachial Palpable Palpable  Carotid Palpable, without bruit Palpable, without bruit  Aorta Not palpable N/A  Femoral Palpable Palpable  Popliteal Not palpable Not palpable  PT Palpable Palpable  DP Not Palpable Not Palpable   Gastrointestinal: soft, NTND, no G/R, no HSM, no masses, no CVAT B  Musculoskeletal: M/S 5/5 throughout , Extremities without ischemic changes , incisions are healed, palpable fem-fem graft  Neurologic: Pain and light touch intact in extremities except mild decrease in feet, Motor exam as listed above   Non-Invasive Vascular Imaging ABI (Date: 01/16/2017)  R:   ABI: 0.75 (0.82),   DP: mono  PT: bi  TBI:  0.43  L:   ABI: 0.75 (0.71),   DP: mono  PT: bi  TBI: 0.75   Aortoiliac Duplex (Date: 01/16/2017)  Ao: 120 c/s  R CIA proximal to stent: 182 c/s  R CIA/EIA stent: 127-401 c/s (~2 ratio)  R EIA Distal to stent: 140 c/s  Patent fem-fem graft   Medical Decision Making  Mary [name] is a 83 y.o. (1923/11/09) female who presents with: RLE critical limb ischemia, s/p R iliofem EA w/ BPA, L-to-R fem-fem BPG, L CIA & EIA PTA+S  Based on the patient's vascular studies and examination, I have offered the patient: BLE ABI and aortoiliac duplex in 6 months.  I discussed in depth with the patient the nature of atherosclerosis, and emphasized the importance of maximal medical management including strict control of blood pressure, blood glucose, and lipid levels, obtaining regular exercise, and cessation of smoking.    The patient is aware that without maximal medical management the underlying atherosclerotic disease process will progress, limiting the benefit of any interventions.  I discussed in depth with the patient the need for long term surveillance to improve the  primary assisted patency of his bypass.  The patient agrees to cooperate with such.  The patient is currently not on a statin: at patient's discretion.  She is taking red yeast rice instead.  The patient is currently on an anti-platelet: ASA.  Thank you for allowing [place] to participate in this patient's care.   Adele Barthel, MD, FACS [facility] Office: [phone] Pager: [phone]

## 2017-01-16 ENCOUNTER — Ambulatory Visit (INDEPENDENT_AMBULATORY_CARE_PROVIDER_SITE_OTHER): Payer: Medicare Other | Admitting: Vascular Surgery

## 2017-01-16 ENCOUNTER — Ambulatory Visit (HOSPITAL_COMMUNITY)
Admission: RE | Admit: 2017-01-16 | Discharge: 2017-01-16 | Disposition: A | Discharge status: Home / Self Care | Payer: Medicare Other | Source: Ambulatory Visit | Attending: Vascular Surgery | Admitting: Vascular Surgery

## 2017-01-16 ENCOUNTER — Encounter: Payer: Self-pay | Admitting: Vascular Surgery

## 2017-01-16 ENCOUNTER — Ambulatory Visit (INDEPENDENT_AMBULATORY_CARE_PROVIDER_SITE_OTHER)
Admission: RE | Admit: 2017-01-16 | Discharge: 2017-01-16 | Disposition: A | Discharge status: Home / Self Care | Payer: Medicare Other | Source: Ambulatory Visit | Attending: Vascular Surgery | Admitting: Vascular Surgery

## 2017-01-16 VITALS — BP 177/71 | HR 52 | Temp 98.6°F | Resp 16 | Ht 61.0 in | Wt 105.0 lb

## 2017-01-16 DIAGNOSIS — Z95820 Peripheral vascular angioplasty status with implants and grafts: Secondary | ICD-10-CM | POA: Insufficient documentation

## 2017-01-16 DIAGNOSIS — E785 Hyperlipidemia, unspecified: Secondary | ICD-10-CM | POA: Insufficient documentation

## 2017-01-16 DIAGNOSIS — M79605 Pain in left leg: Secondary | ICD-10-CM

## 2017-01-16 DIAGNOSIS — I70219 Atherosclerosis of native arteries of extremities with intermittent claudication, unspecified extremity: Secondary | ICD-10-CM | POA: Insufficient documentation

## 2017-01-16 DIAGNOSIS — Z95828 Presence of other vascular implants and grafts: Secondary | ICD-10-CM | POA: Diagnosis not present

## 2017-01-16 DIAGNOSIS — M79604 Pain in right leg: Secondary | ICD-10-CM

## 2017-01-16 DIAGNOSIS — I70261 Atherosclerosis of native arteries of extremities with gangrene, right leg: Secondary | ICD-10-CM

## 2017-01-16 DIAGNOSIS — I999 Unspecified disorder of circulatory system: Secondary | ICD-10-CM | POA: Diagnosis not present

## 2017-01-16 DIAGNOSIS — I10 Essential (primary) hypertension: Secondary | ICD-10-CM | POA: Insufficient documentation

## 2017-01-16 DIAGNOSIS — I998 Other disorder of circulatory system: Secondary | ICD-10-CM

## 2017-01-22 NOTE — Addendum Note (Signed)
Addended by: Lianne Cure A on: 01/22/2017 10:07 AM   Modules accepted: Orders

## 2017-02-04 DIAGNOSIS — L110 Acquired keratosis follicularis: Secondary | ICD-10-CM | POA: Diagnosis not present

## 2017-02-04 DIAGNOSIS — I739 Peripheral vascular disease, unspecified: Secondary | ICD-10-CM | POA: Diagnosis not present

## 2017-02-04 DIAGNOSIS — B351 Tinea unguium: Secondary | ICD-10-CM | POA: Diagnosis not present

## 2017-03-11 DIAGNOSIS — L03031 Cellulitis of right toe: Secondary | ICD-10-CM | POA: Diagnosis not present

## 2017-03-11 DIAGNOSIS — M79671 Pain in right foot: Secondary | ICD-10-CM | POA: Diagnosis not present

## 2017-03-11 DIAGNOSIS — L600 Ingrowing nail: Secondary | ICD-10-CM | POA: Diagnosis not present

## 2017-03-11 DIAGNOSIS — M79674 Pain in right toe(s): Secondary | ICD-10-CM | POA: Diagnosis not present

## 2017-03-18 DIAGNOSIS — D649 Anemia, unspecified: Secondary | ICD-10-CM | POA: Diagnosis not present

## 2017-03-18 DIAGNOSIS — N183 Chronic kidney disease, stage 3 (moderate): Secondary | ICD-10-CM | POA: Diagnosis not present

## 2017-03-18 DIAGNOSIS — E782 Mixed hyperlipidemia: Secondary | ICD-10-CM | POA: Diagnosis not present

## 2017-03-18 DIAGNOSIS — I10 Essential (primary) hypertension: Secondary | ICD-10-CM | POA: Diagnosis not present

## 2017-03-20 DIAGNOSIS — Z6820 Body mass index (BMI) 20.0-20.9, adult: Secondary | ICD-10-CM | POA: Diagnosis not present

## 2017-03-20 DIAGNOSIS — N183 Chronic kidney disease, stage 3 (moderate): Secondary | ICD-10-CM | POA: Diagnosis not present

## 2017-03-20 DIAGNOSIS — E782 Mixed hyperlipidemia: Secondary | ICD-10-CM | POA: Diagnosis not present

## 2017-03-20 DIAGNOSIS — I10 Essential (primary) hypertension: Secondary | ICD-10-CM | POA: Diagnosis not present

## 2017-03-20 DIAGNOSIS — G6289 Other specified polyneuropathies: Secondary | ICD-10-CM | POA: Diagnosis not present

## 2017-03-20 DIAGNOSIS — I739 Peripheral vascular disease, unspecified: Secondary | ICD-10-CM | POA: Diagnosis not present

## 2017-03-25 DIAGNOSIS — I739 Peripheral vascular disease, unspecified: Secondary | ICD-10-CM | POA: Diagnosis not present

## 2017-03-25 DIAGNOSIS — L03031 Cellulitis of right toe: Secondary | ICD-10-CM | POA: Diagnosis not present

## 2017-03-25 DIAGNOSIS — B351 Tinea unguium: Secondary | ICD-10-CM | POA: Diagnosis not present

## 2017-03-25 DIAGNOSIS — L110 Acquired keratosis follicularis: Secondary | ICD-10-CM | POA: Diagnosis not present

## 2017-04-22 DIAGNOSIS — M79671 Pain in right foot: Secondary | ICD-10-CM | POA: Diagnosis not present

## 2017-04-22 DIAGNOSIS — L03031 Cellulitis of right toe: Secondary | ICD-10-CM | POA: Diagnosis not present

## 2017-05-08 ENCOUNTER — Encounter (HOSPITAL_COMMUNITY): Payer: Medicare Other

## 2017-05-08 ENCOUNTER — Ambulatory Visit: Payer: Medicare Other | Admitting: Vascular Surgery

## 2017-06-23 DIAGNOSIS — Z23 Encounter for immunization: Secondary | ICD-10-CM | POA: Diagnosis not present

## 2017-07-03 DIAGNOSIS — H43821 Vitreomacular adhesion, right eye: Secondary | ICD-10-CM | POA: Diagnosis not present

## 2017-07-03 DIAGNOSIS — H353190 Nonexudative age-related macular degeneration, unspecified eye, stage unspecified: Secondary | ICD-10-CM | POA: Diagnosis not present

## 2017-07-03 DIAGNOSIS — H02054 Trichiasis without entropian left upper eyelid: Secondary | ICD-10-CM | POA: Diagnosis not present

## 2017-07-03 DIAGNOSIS — H26491 Other secondary cataract, right eye: Secondary | ICD-10-CM | POA: Diagnosis not present

## 2017-07-14 DIAGNOSIS — N183 Chronic kidney disease, stage 3 (moderate): Secondary | ICD-10-CM | POA: Diagnosis not present

## 2017-07-14 DIAGNOSIS — E782 Mixed hyperlipidemia: Secondary | ICD-10-CM | POA: Diagnosis not present

## 2017-07-14 DIAGNOSIS — I10 Essential (primary) hypertension: Secondary | ICD-10-CM | POA: Diagnosis not present

## 2017-07-14 DIAGNOSIS — Z9189 Other specified personal risk factors, not elsewhere classified: Secondary | ICD-10-CM | POA: Diagnosis not present

## 2017-07-17 DIAGNOSIS — E782 Mixed hyperlipidemia: Secondary | ICD-10-CM | POA: Diagnosis not present

## 2017-07-17 DIAGNOSIS — N183 Chronic kidney disease, stage 3 (moderate): Secondary | ICD-10-CM | POA: Diagnosis not present

## 2017-07-17 DIAGNOSIS — G6289 Other specified polyneuropathies: Secondary | ICD-10-CM | POA: Diagnosis not present

## 2017-07-17 DIAGNOSIS — I739 Peripheral vascular disease, unspecified: Secondary | ICD-10-CM | POA: Diagnosis not present

## 2017-07-17 DIAGNOSIS — Z681 Body mass index (BMI) 19 or less, adult: Secondary | ICD-10-CM | POA: Diagnosis not present

## 2017-07-17 DIAGNOSIS — I10 Essential (primary) hypertension: Secondary | ICD-10-CM | POA: Diagnosis not present

## 2017-07-29 NOTE — Progress Notes (Signed)
Established Previous Bypass   History of Present Illness   Mary [name] is a 83 y.o. (05-05-1923) female who presents with chief complaint: .  Previous operation(s) include:   1. L to R fem-fem BPG, R iliofemoral EA w/ BPA (07/05/14) 2. PTA+S L CIA and EIA (01/10/16).    The patient's symptoms have not progressed.  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 The patient's treatment regimen currently included: maximal medical management.  The patient's PMH, PSH, SH, and FamHx are unchanged from 01/16/17.  Current Outpatient Prescriptions  Medication Sig Dispense Refill  . alendronate (FOSAMAX) 70 MG tablet 70 mg once a week.     Marland Kitchen amLODipine (NORVASC) 2.5 MG tablet Take 2.5 mg by mouth daily.    Marland Kitchen aspirin 81 MG chewable tablet Chew 1 tablet (81 mg total) by mouth daily.    Marland Kitchen losartan-hydrochlorothiazide (HYZAAR) 100-25 MG per tablet Take 1 tablet by mouth daily.    . metoprolol succinate (TOPROL-XL) 50 MG 24 hr tablet Take 50 mg by mouth daily. Take with or immediately following a meal.    . Multiple Vitamins-Minerals (MULTIVITAMIN WITH MINERALS) tablet Take 1 tablet by mouth daily.    . polyvinyl alcohol (LIQUIFILM TEARS) 1.4 % ophthalmic solution 1 drop as needed for dry eyes.    . Red Yeast Rice 600 MG TABS Take 2 tablets by mouth daily.      No current facility-administered medications for this visit.     On ROS today: residual neuropathic sx in R leg, no intermittent claudication    Physical Examination   Vitals:   07/31/17 1020  BP: (!) 163/69  Pulse: (!) 52  Resp: 18  Temp: (!) 97.4 F (36.3 C)  SpO2: 98%  Weight: 100 lb (45.4 kg)  Height: 5\' 1"  (1.549 m)   Body mass index is 18.89 kg/m.  General Alert, O x 3, WD, elderly and cachectic  Pulmonary Sym exp, good B air movt, CTA B  Cardiac RRR, Nl S1, S2, no Murmurs, No rubs, No S3,S4  Vascular Vessel Right Left  Radial Palpable Palpable    Brachial Palpable Palpable  Carotid Palpable, No Bruit Palpable, No Bruit  Aorta Not palpable N/A  Femoral Palpable Palpable  Popliteal Not palpable Not palpable  PT Not palpable Not palpable  DP Faintly palpable Palpable    Gastro- intestinal soft, non-distended, non-tender to palpation, No guarding or rebound, no HSM, no masses, no CVAT B, No palpable prominent aortic pulse,    Musculo- skeletal M/S 5/5 throughout  , Extremities without ischemic changes  , No edema present, No visible varicosities , No Lipodermatosclerosis present  Neurologic Pain and light touch intact in extremities except right leg somewhat decreased, Motor exam as listed above    Non-Invasive Vascular Imaging ABI (07/31/2017)  R:   ABI: 0.66 (0.75),   PT: mono/bi  DP: barely bi  TBI:  0.24  L:   ABI: 0.69 (0.75),   PT: mono  DP: mono  TBI: 0.45  Aortoiliac Duplex (07/31/2017)  Patent fem-fem graft  Stent: 210-439 c/s (Vr 2.3)  Appears patent   Medical Decision Making   Mary [name] is a 83 y.o. female who presents with: s/p L-R fem-fem BPG, R iliofem EA w/ BPA for R leg CLI, PTA+S L CIA and EIA.   Based on the patient's vascular studies and examination, I have offered the patient: q6 month BLE ABI, aortoiliac duplex   I  discussed in depth with the patient the nature of atherosclerosis, and emphasized the importance of maximal medical management including strict control of blood pressure, blood glucose, and lipid levels, obtaining regular exercise, and cessation of smoking.    The patient is aware that without maximal medical management the underlying atherosclerotic disease process will progress, limiting the benefit of any interventions.  I discussed in depth with the patient the need for long term surveillance to improve the primary assisted patency of his bypass.  The patient agrees to cooperate with such. The patient is currently not a statin per patient's preference.  She is  taking red yeast rice instead.  The patient is currently on an anti-platelet: ASA.  Thank you for allowing [place] to participate in this patient's care.   Adele Barthel, MD, FACS [facility] Office: [phone] Pager: [phone]

## 2017-07-31 ENCOUNTER — Ambulatory Visit (INDEPENDENT_AMBULATORY_CARE_PROVIDER_SITE_OTHER)
Admission: RE | Admit: 2017-07-31 | Discharge: 2017-07-31 | Disposition: A | Discharge status: Home / Self Care | Payer: Medicare Other | Source: Ambulatory Visit | Attending: Vascular Surgery | Admitting: Vascular Surgery

## 2017-07-31 ENCOUNTER — Ambulatory Visit (HOSPITAL_COMMUNITY)
Admission: RE | Admit: 2017-07-31 | Discharge: 2017-07-31 | Disposition: A | Discharge status: Home / Self Care | Payer: Medicare Other | Source: Ambulatory Visit | Attending: Vascular Surgery | Admitting: Vascular Surgery

## 2017-07-31 ENCOUNTER — Encounter: Payer: Self-pay | Admitting: Vascular Surgery

## 2017-07-31 ENCOUNTER — Ambulatory Visit (INDEPENDENT_AMBULATORY_CARE_PROVIDER_SITE_OTHER): Payer: Medicare Other | Admitting: Vascular Surgery

## 2017-07-31 VITALS — BP 163/69 | HR 52 | Temp 97.4°F | Resp 18 | Ht 61.0 in | Wt 100.0 lb

## 2017-07-31 DIAGNOSIS — I70261 Atherosclerosis of native arteries of extremities with gangrene, right leg: Secondary | ICD-10-CM | POA: Diagnosis not present

## 2017-07-31 DIAGNOSIS — Z95828 Presence of other vascular implants and grafts: Secondary | ICD-10-CM | POA: Insufficient documentation

## 2017-08-06 NOTE — Addendum Note (Signed)
Addended by: Lianne Cure A on: 08/06/2017 04:12 PM   Modules accepted: Orders

## 2017-08-28 DIAGNOSIS — H353190 Nonexudative age-related macular degeneration, unspecified eye, stage unspecified: Secondary | ICD-10-CM | POA: Diagnosis not present

## 2017-08-28 DIAGNOSIS — H43822 Vitreomacular adhesion, left eye: Secondary | ICD-10-CM | POA: Diagnosis not present

## 2017-08-28 DIAGNOSIS — H02054 Trichiasis without entropian left upper eyelid: Secondary | ICD-10-CM | POA: Diagnosis not present

## 2017-10-06 DIAGNOSIS — H02055 Trichiasis without entropian left lower eyelid: Secondary | ICD-10-CM | POA: Diagnosis not present

## 2017-11-15 DIAGNOSIS — M5030 Other cervical disc degeneration, unspecified cervical region: Secondary | ICD-10-CM | POA: Diagnosis not present

## 2017-11-15 DIAGNOSIS — M810 Age-related osteoporosis without current pathological fracture: Secondary | ICD-10-CM | POA: Diagnosis not present

## 2017-11-15 DIAGNOSIS — M542 Cervicalgia: Secondary | ICD-10-CM | POA: Diagnosis not present

## 2017-11-15 DIAGNOSIS — Z87891 Personal history of nicotine dependence: Secondary | ICD-10-CM | POA: Diagnosis not present

## 2017-11-15 DIAGNOSIS — M1990 Unspecified osteoarthritis, unspecified site: Secondary | ICD-10-CM | POA: Diagnosis not present

## 2017-11-15 DIAGNOSIS — R51 Headache: Secondary | ICD-10-CM | POA: Diagnosis not present

## 2017-11-15 DIAGNOSIS — I10 Essential (primary) hypertension: Secondary | ICD-10-CM | POA: Diagnosis not present

## 2017-11-15 DIAGNOSIS — M4722 Other spondylosis with radiculopathy, cervical region: Secondary | ICD-10-CM | POA: Diagnosis not present

## 2017-11-15 DIAGNOSIS — Z79899 Other long term (current) drug therapy: Secondary | ICD-10-CM | POA: Diagnosis not present

## 2017-11-15 DIAGNOSIS — G44209 Tension-type headache, unspecified, not intractable: Secondary | ICD-10-CM | POA: Diagnosis not present

## 2017-11-15 DIAGNOSIS — G4489 Other headache syndrome: Secondary | ICD-10-CM | POA: Diagnosis not present

## 2017-11-16 DIAGNOSIS — I10 Essential (primary) hypertension: Secondary | ICD-10-CM | POA: Diagnosis not present

## 2017-11-16 DIAGNOSIS — I739 Peripheral vascular disease, unspecified: Secondary | ICD-10-CM | POA: Diagnosis not present

## 2017-11-16 DIAGNOSIS — Z681 Body mass index (BMI) 19 or less, adult: Secondary | ICD-10-CM | POA: Diagnosis not present

## 2017-11-16 DIAGNOSIS — G6289 Other specified polyneuropathies: Secondary | ICD-10-CM | POA: Diagnosis not present

## 2017-11-16 DIAGNOSIS — E782 Mixed hyperlipidemia: Secondary | ICD-10-CM | POA: Diagnosis not present

## 2017-11-16 DIAGNOSIS — Z0001 Encounter for general adult medical examination with abnormal findings: Secondary | ICD-10-CM | POA: Diagnosis not present

## 2017-11-16 DIAGNOSIS — N183 Chronic kidney disease, stage 3 (moderate): Secondary | ICD-10-CM | POA: Diagnosis not present

## 2018-02-15 NOTE — Progress Notes (Deleted)
Established Previous Bypass   History of Present Illness   Mary [name] is a 84 y.o. (09-01-1923) female who presents with chief complaint: ***.    Previous operation(s) include:  1. L to R fem-fem BPG, R iliofemoral EA w/ BPA (07/05/14) 2. 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  The patient's symptoms have *** progressed.  The patient's symptoms are: ***. The patient's treatment regimen currently included: maximal medical management ***and ***.  The patient's PMH, PSH, SH, and FamHx were reviewed on 02/17/18 are unchanged from 07/31/17.  Current Outpatient Medications  Medication Sig Dispense Refill  . alendronate (FOSAMAX) 70 MG tablet 70 mg once a week.     Marland Kitchen amLODipine (NORVASC) 2.5 MG tablet Take 2.5 mg by mouth daily.    Marland Kitchen aspirin 81 MG chewable tablet Chew 1 tablet (81 mg total) by mouth daily.    Marland Kitchen losartan-hydrochlorothiazide (HYZAAR) 100-25 MG per tablet Take 1 tablet by mouth daily.    . metoprolol succinate (TOPROL-XL) 50 MG 24 hr tablet Take 50 mg by mouth daily. Take with or immediately following a meal.    . Multiple Vitamins-Minerals (MULTIVITAMIN WITH MINERALS) tablet Take 1 tablet by mouth daily.    . polyvinyl alcohol (LIQUIFILM TEARS) 1.4 % ophthalmic solution 1 drop as needed for dry eyes.    . Red Yeast Rice 600 MG TABS Take 2 tablets by mouth daily.      No current facility-administered medications for this visit.     On ROS today: ***, ***   Physical Examination  ***There were no vitals filed for this visit. ***There is no height or weight on file to calculate BMI.  General {LOC:19197::"Somulent","Alert"}, {Orientation:19197::"Confused","O x 3"}, {Weight:19197::"Obese","Cachectic","WD"}, {General state of health:19197::"Ill appearing","Elderly","NAD"}  Pulmonary {Chest wall:19197::"Asx chest movement","Sym exp"}, {Air movt:19197::"Decreased *** air movt","good B air  movt"}, {BS:19197::"rales on ***","rhonchi on ***","wheezing on ***","CTA B"}  Cardiac {Rhythm:19197::"Irregularly, irregular rate and rhythm","RRR, Nl S1, S2"}, {Murmur:19197::"Murmur present: ***","no Murmurs"}, {Rubs:19197::"Rub present: ***","No rubs"}, {Gallop:19197::"Gallop present: ***","No S3,S4"}  Vascular Vessel Right Left  Radial {Palpable:19197::"Not palpable","Faintly palpable","Palpable"} {Palpable:19197::"Not palpable","Faintly palpable","Palpable"}  Brachial {Palpable:19197::"Not palpable","Faintly palpable","Palpable"} {Palpable:19197::"Not palpable","Faintly palpable","Palpable"}  Carotid Palpable, {Bruit:19197::"Bruit present","No Bruit"} Palpable, {Bruit:19197::"Bruit present","No Bruit"}  Aorta Not palpable N/A  Femoral {Palpable:19197::"Not palpable","Faintly palpable","Palpable"} {Palpable:19197::"Not palpable","Faintly palpable","Palpable"}  Popliteal {Palpable:19197::"Prominently palpable","Not palpable"} {Palpable:19197::"Prominently palpable","Not palpable"}  PT {Palpable:19197::"Not palpable","Faintly palpable","Palpable"} {Palpable:19197::"Not palpable","Faintly palpable","Palpable"}  DP {Palpable:19197::"Not palpable","Faintly palpable","Palpable"} {Palpable:19197::"Not palpable","Faintly palpable","Palpable"}    Gastro- intestinal soft, {Distension:19197::"distended","non-distended"}, {TTP:19197::"TTP in *** quadrant","appropriate tenderness to palpation","non-tender to palpation"}, {Guarding:19197::"Guarding and rebound in *** quadrant","No guarding or rebound"}, {HSM:19197::"HSM present","no HSM"}, {Masses:19197::"Mass present: ***","no masses"}, {Flank:19197::"CVAT on ***","Flank bruit present on ***","no CVAT B"}, {AAA:19197::"Palpable prominent aortic pulse","No palpable prominent aortic pulse"}, {Surgical incision:19197::"Surg incisions: ***","Surgical incisions well healed"," "}  Musculo- skeletal M/S 5/5 throughout {MS:19197::"except ***"," "}, Extremities  without ischemic changes {MS:19197::"except ***"," "}, {Edema:19197::"Pitting edema present: ***","Non-pitting edema present: ***","No edema present"}, {Varicosities:19197::"Varicosities present: ***","No visible varicosities "}, {LDS:19197::"Lipodermatosclerosis present: ***","No Lipodermatosclerosis present"}  Neurologic Pain and light touch intact in extremities{CN:19197::" except for decreased sensation in ***"," "}, Motor exam as listed above    Non-Invasive Vascular Imaging ABI (***)  R:   ABI: *** (0.66),   PT: {Signals:19197::"none","mono","bi","tri"}  DP: {Signals:19197::"none","mono","bi","tri"}  TBI:  ***  L:   ABI: *** (0.69),   PT: {Signals:19197::"none","mono","bi","tri"}  DP: {Signals:19197::"none","mono","bi","tri"}  TBI: ***  Aortoiliac Duplex (***)  Ao: *** c/s  R iliac: *** c/s  L  iliac: *** c/s  Bypass Duplex (***)  Proximal to bypass: *** c/s  With-in bypass: *** c/s  Distal to bypass: *** c/s   Medical Decision Making   Mary [name] is a 84 y.o. 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   Based on the patient's vascular studies and examination, I have offered the patient: ***BLE ABI, ***bypass duplex, and ***aortoiliac duplex in *** months.  I discussed in depth with the patient the nature of atherosclerosis, and emphasized the importance of maximal medical management including strict control of blood pressure, blood glucose, and lipid levels, obtaining regular exercise, and cessation of smoking.    The patient is aware that without maximal medical management the underlying atherosclerotic disease process will progress, limiting the benefit of any interventions.  I discussed in depth with the patient the need for long term surveillance to improve the primary assisted patency of his bypass.  The patient agrees to cooperate with such.  The patient is currently not on  statin per patient's preference.  She is taking red yeast.   The patient is currently on an anti-platelet: ASA.  Thank you for allowing [place] to participate in this patient's care.   Adele Barthel, MD, FACS [facility] Office: [phone] Pager: [phone]

## 2018-02-17 ENCOUNTER — Encounter (HOSPITAL_COMMUNITY): Payer: Medicare Other

## 2018-02-17 ENCOUNTER — Inpatient Hospital Stay (HOSPITAL_COMMUNITY): Admission: RE | Admit: 2018-02-17 | Payer: Medicare Other | Source: Ambulatory Visit

## 2018-02-17 ENCOUNTER — Ambulatory Visit: Payer: Medicare Other | Admitting: Vascular Surgery

## 2018-03-12 DIAGNOSIS — N183 Chronic kidney disease, stage 3 (moderate): Secondary | ICD-10-CM | POA: Diagnosis not present

## 2018-03-12 DIAGNOSIS — D649 Anemia, unspecified: Secondary | ICD-10-CM | POA: Diagnosis not present

## 2018-03-12 DIAGNOSIS — Z9189 Other specified personal risk factors, not elsewhere classified: Secondary | ICD-10-CM | POA: Diagnosis not present

## 2018-03-12 DIAGNOSIS — E782 Mixed hyperlipidemia: Secondary | ICD-10-CM | POA: Diagnosis not present

## 2018-03-12 DIAGNOSIS — I10 Essential (primary) hypertension: Secondary | ICD-10-CM | POA: Diagnosis not present

## 2018-03-16 DIAGNOSIS — I739 Peripheral vascular disease, unspecified: Secondary | ICD-10-CM | POA: Diagnosis not present

## 2018-03-16 DIAGNOSIS — E782 Mixed hyperlipidemia: Secondary | ICD-10-CM | POA: Diagnosis not present

## 2018-03-16 DIAGNOSIS — I10 Essential (primary) hypertension: Secondary | ICD-10-CM | POA: Diagnosis not present

## 2018-03-16 DIAGNOSIS — N183 Chronic kidney disease, stage 3 (moderate): Secondary | ICD-10-CM | POA: Diagnosis not present

## 2018-03-16 DIAGNOSIS — G6289 Other specified polyneuropathies: Secondary | ICD-10-CM | POA: Diagnosis not present

## 2018-03-16 DIAGNOSIS — G301 Alzheimer's disease with late onset: Secondary | ICD-10-CM | POA: Diagnosis not present

## 2018-03-16 DIAGNOSIS — Z681 Body mass index (BMI) 19 or less, adult: Secondary | ICD-10-CM | POA: Diagnosis not present

## 2018-03-19 DIAGNOSIS — R413 Other amnesia: Secondary | ICD-10-CM | POA: Diagnosis not present

## 2018-03-19 DIAGNOSIS — G309 Alzheimer's disease, unspecified: Secondary | ICD-10-CM | POA: Diagnosis not present

## 2018-03-19 DIAGNOSIS — G301 Alzheimer's disease with late onset: Secondary | ICD-10-CM | POA: Diagnosis not present

## 2018-04-07 DIAGNOSIS — I10 Essential (primary) hypertension: Secondary | ICD-10-CM | POA: Diagnosis not present

## 2018-04-07 DIAGNOSIS — Z681 Body mass index (BMI) 19 or less, adult: Secondary | ICD-10-CM | POA: Diagnosis not present

## 2018-04-20 NOTE — Progress Notes (Signed)
Established Previous Bypass   History of Present Illness   Mary [name] is a 84 y.o. (04-08-1923) female who presents with chief complaint: none.    Prior procedures include: 1. L-to-R fem-fem BPG, R iliofemoral EA w/ BPA (07/05/14) 2.  PTA+S L CIA and EIA (01/10/16)  The patient's symptoms have NOT progressed.  The patient's symptoms are: none. The patient's treatment regimen currently included: maximal medical management.  The patient's PMH, PSH, SH, and FamHx were reviewed on 04/21/18 are unchanged from 07/31/17.  Current Outpatient Medications  Medication Sig Dispense Refill  . alendronate (FOSAMAX) 70 MG tablet 70 mg once a week.     Marland Kitchen amLODipine (NORVASC) 2.5 MG tablet Take 2.5 mg by mouth daily.    Marland Kitchen aspirin 81 MG chewable tablet Chew 1 tablet (81 mg total) by mouth daily.    Marland Kitchen losartan-hydrochlorothiazide (HYZAAR) 100-25 MG per tablet Take 1 tablet by mouth daily.    . metoprolol succinate (TOPROL-XL) 50 MG 24 hr tablet Take 50 mg by mouth daily. Take with or immediately following a meal.    . Multiple Vitamins-Minerals (MULTIVITAMIN WITH MINERALS) tablet Take 1 tablet by mouth daily.    . polyvinyl alcohol (LIQUIFILM TEARS) 1.4 % ophthalmic solution 1 drop as needed for dry eyes.    . Red Yeast Rice 600 MG TABS Take 2 tablets by mouth daily.      No current facility-administered medications for this visit.     On ROS today: no intermittent claudication , no rest pain   Physical Examination   Vitals:   04/21/18 1100  BP: (!) 188/67  Pulse: 60  SpO2: 99%  Weight: 94 lb 3.2 oz (42.7 kg)  Height: 5\' 1"  (1.549 m)   Body mass index is 17.8 kg/m.   General Alert, O x 3, Cachectic, Elderly  Pulmonary Sym exp, good B air movt, CTA B  Cardiac RRR, Nl S1, S2, no Murmurs, No rubs, No S3,S4  Vascular Vessel Right Left  Radial Palpable Palpable  Brachial Palpable Palpable  Carotid Palpable, No Bruit Palpable, No Bruit  Aorta Not palpable N/A  Femoral Palpable  Palpable  Popliteal Not palpable Not palpable  PT Not palpable Not palpable  DP Faintly palpable Faintly palpable    Gastro- intestinal soft, non-distended, non-tender to palpation, No guarding or rebound, no HSM, no masses, no CVAT B, No palpable prominent aortic pulse,    Musculo- skeletal M/S 5/5 throughout  , Extremities without ischemic changes  , No edema present, No visible varicosities , No Lipodermatosclerosis present  Neurologic Pain and light touch intact in extremities , Motor exam as listed above   Non-invasive Vascular Imaging   ABI (04/21/2018)  R:   ABI: 0.75 (0.66),   PT: mono  DP: mono  TBI:  0.31  L:   ABI: 0.78 (0.69),   PT: mono  DP: mono  TBI: 0.42  Aortoiliac Duplex (04/21/2018)  Ao: 178 c/s  L iliac: 412-477 c/s (in-stent, Vr 2.7)  Patent fem-fem graft  No significant change from previous study    Medical Decision Making   Mary [name] is a 84 y.o. female who presents with: s/p L CIA and EIA PTA+S, L-to-R fem-fem BPG .   Based on the patient's vascular studies and examination, I have offered the patient: BLE ABI, and aortoiliac duplex in 6 months.  I discussed in depth with the patient the nature of atherosclerosis, and emphasized the importance of maximal medical management including strict control of  blood pressure, blood glucose, and lipid levels, obtaining regular exercise, and cessation of smoking.    The patient is aware that without maximal medical management the underlying atherosclerotic disease process will progress, limiting the benefit of any interventions.  I discussed in depth with the patient the need for long term surveillance to improve the primary assisted patency of his bypass.  The patient agrees to cooperate with such.  The patient is currently not on a statin due to reported allergy.  Pt is taking red yeast rice.   The patient is currently on an anti-platelet: ASA.  Thank you for allowing [place] to  participate in this patient's care.   Adele Barthel, MD, FACS [facility] Office: [phone] Pager: [phone]

## 2018-04-21 ENCOUNTER — Ambulatory Visit (HOSPITAL_COMMUNITY)
Admission: RE | Admit: 2018-04-21 | Discharge: 2018-04-21 | Disposition: A | Discharge status: Home / Self Care | Payer: Medicare Other | Source: Ambulatory Visit | Attending: Vascular Surgery | Admitting: Vascular Surgery

## 2018-04-21 ENCOUNTER — Ambulatory Visit (INDEPENDENT_AMBULATORY_CARE_PROVIDER_SITE_OTHER): Payer: Medicare Other | Admitting: Vascular Surgery

## 2018-04-21 ENCOUNTER — Encounter: Payer: Self-pay | Admitting: Vascular Surgery

## 2018-04-21 VITALS — BP 188/67 | HR 60 | Ht 61.0 in | Wt 94.2 lb

## 2018-04-21 DIAGNOSIS — R9389 Abnormal findings on diagnostic imaging of other specified body structures: Secondary | ICD-10-CM | POA: Diagnosis not present

## 2018-04-21 DIAGNOSIS — I70261 Atherosclerosis of native arteries of extremities with gangrene, right leg: Secondary | ICD-10-CM | POA: Diagnosis not present

## 2018-04-21 DIAGNOSIS — T82855A Stenosis of coronary artery stent, initial encounter: Secondary | ICD-10-CM | POA: Diagnosis not present

## 2018-04-21 DIAGNOSIS — I708 Atherosclerosis of other arteries: Secondary | ICD-10-CM | POA: Insufficient documentation

## 2018-04-21 DIAGNOSIS — R0989 Other specified symptoms and signs involving the circulatory and respiratory systems: Secondary | ICD-10-CM | POA: Diagnosis present

## 2018-04-28 ENCOUNTER — Other Ambulatory Visit: Payer: Self-pay

## 2018-04-28 DIAGNOSIS — I998 Other disorder of circulatory system: Secondary | ICD-10-CM

## 2018-04-28 DIAGNOSIS — I70229 Atherosclerosis of native arteries of extremities with rest pain, unspecified extremity: Secondary | ICD-10-CM

## 2018-05-19 DIAGNOSIS — B351 Tinea unguium: Secondary | ICD-10-CM | POA: Diagnosis not present

## 2018-05-19 DIAGNOSIS — L110 Acquired keratosis follicularis: Secondary | ICD-10-CM | POA: Diagnosis not present

## 2018-05-19 DIAGNOSIS — I739 Peripheral vascular disease, unspecified: Secondary | ICD-10-CM | POA: Diagnosis not present

## 2018-05-30 DIAGNOSIS — M1990 Unspecified osteoarthritis, unspecified site: Secondary | ICD-10-CM | POA: Diagnosis not present

## 2018-05-30 DIAGNOSIS — Z79899 Other long term (current) drug therapy: Secondary | ICD-10-CM | POA: Diagnosis not present

## 2018-05-30 DIAGNOSIS — I10 Essential (primary) hypertension: Secondary | ICD-10-CM | POA: Diagnosis not present

## 2018-05-30 DIAGNOSIS — S60811A Abrasion of right wrist, initial encounter: Secondary | ICD-10-CM | POA: Diagnosis not present

## 2018-05-30 DIAGNOSIS — M810 Age-related osteoporosis without current pathological fracture: Secondary | ICD-10-CM | POA: Diagnosis not present

## 2018-05-30 DIAGNOSIS — Z87891 Personal history of nicotine dependence: Secondary | ICD-10-CM | POA: Diagnosis not present

## 2018-05-30 DIAGNOSIS — W010XXA Fall on same level from slipping, tripping and stumbling without subsequent striking against object, initial encounter: Secondary | ICD-10-CM | POA: Diagnosis not present

## 2018-05-30 DIAGNOSIS — S0990XA Unspecified injury of head, initial encounter: Secondary | ICD-10-CM | POA: Diagnosis not present

## 2018-05-30 DIAGNOSIS — S0101XA Laceration without foreign body of scalp, initial encounter: Secondary | ICD-10-CM | POA: Diagnosis not present

## 2018-05-30 DIAGNOSIS — S0003XA Contusion of scalp, initial encounter: Secondary | ICD-10-CM | POA: Diagnosis not present

## 2018-06-04 DIAGNOSIS — Z87891 Personal history of nicotine dependence: Secondary | ICD-10-CM | POA: Diagnosis not present

## 2018-06-04 DIAGNOSIS — Z4802 Encounter for removal of sutures: Secondary | ICD-10-CM | POA: Diagnosis not present

## 2018-06-04 DIAGNOSIS — Z85038 Personal history of other malignant neoplasm of large intestine: Secondary | ICD-10-CM | POA: Diagnosis not present

## 2018-06-04 DIAGNOSIS — I10 Essential (primary) hypertension: Secondary | ICD-10-CM | POA: Diagnosis not present

## 2018-06-04 DIAGNOSIS — M19011 Primary osteoarthritis, right shoulder: Secondary | ICD-10-CM | POA: Diagnosis not present

## 2018-06-04 DIAGNOSIS — M25511 Pain in right shoulder: Secondary | ICD-10-CM | POA: Diagnosis not present

## 2018-06-04 DIAGNOSIS — Z79899 Other long term (current) drug therapy: Secondary | ICD-10-CM | POA: Diagnosis not present

## 2018-08-04 DIAGNOSIS — I739 Peripheral vascular disease, unspecified: Secondary | ICD-10-CM | POA: Diagnosis not present

## 2018-08-04 DIAGNOSIS — B351 Tinea unguium: Secondary | ICD-10-CM | POA: Diagnosis not present

## 2018-08-04 DIAGNOSIS — L110 Acquired keratosis follicularis: Secondary | ICD-10-CM | POA: Diagnosis not present

## 2018-08-08 DIAGNOSIS — M1811 Unilateral primary osteoarthritis of first carpometacarpal joint, right hand: Secondary | ICD-10-CM | POA: Diagnosis not present

## 2018-08-08 DIAGNOSIS — E7800 Pure hypercholesterolemia, unspecified: Secondary | ICD-10-CM | POA: Diagnosis not present

## 2018-08-08 DIAGNOSIS — Z87891 Personal history of nicotine dependence: Secondary | ICD-10-CM | POA: Diagnosis not present

## 2018-08-08 DIAGNOSIS — I10 Essential (primary) hypertension: Secondary | ICD-10-CM | POA: Diagnosis not present

## 2018-08-08 DIAGNOSIS — M810 Age-related osteoporosis without current pathological fracture: Secondary | ICD-10-CM | POA: Diagnosis not present

## 2018-08-08 DIAGNOSIS — Z85038 Personal history of other malignant neoplasm of large intestine: Secondary | ICD-10-CM | POA: Diagnosis not present

## 2018-08-08 DIAGNOSIS — Z79899 Other long term (current) drug therapy: Secondary | ICD-10-CM | POA: Diagnosis not present

## 2018-08-08 DIAGNOSIS — W1839XA Other fall on same level, initial encounter: Secondary | ICD-10-CM | POA: Diagnosis not present

## 2018-08-08 DIAGNOSIS — Z888 Allergy status to other drugs, medicaments and biological substances status: Secondary | ICD-10-CM | POA: Diagnosis not present

## 2018-08-08 DIAGNOSIS — S63501A Unspecified sprain of right wrist, initial encounter: Secondary | ICD-10-CM | POA: Diagnosis not present

## 2018-08-08 DIAGNOSIS — M79641 Pain in right hand: Secondary | ICD-10-CM | POA: Diagnosis not present

## 2018-10-20 ENCOUNTER — Encounter (HOSPITAL_COMMUNITY): Payer: Medicare Other

## 2018-10-20 ENCOUNTER — Ambulatory Visit: Payer: Medicare Other | Admitting: Vascular Surgery

## 2018-10-27 DIAGNOSIS — B351 Tinea unguium: Secondary | ICD-10-CM | POA: Diagnosis not present

## 2018-10-27 DIAGNOSIS — L110 Acquired keratosis follicularis: Secondary | ICD-10-CM | POA: Diagnosis not present

## 2018-10-27 DIAGNOSIS — I739 Peripheral vascular disease, unspecified: Secondary | ICD-10-CM | POA: Diagnosis not present

## 2019-04-11 DEATH — deceased
